# Patient Record
Sex: Female | Born: 1955 | Race: White | Hispanic: No | Marital: Married | Smoking: Former smoker
Health system: Southern US, Community
[De-identification: ages and names within clinical notes are randomized; demographics above are authoritative.]

## PROBLEM LIST (undated history)

## (undated) VITALS — BP 121/80 | HR 73 | Temp 97.6°F | Resp 16 | Ht 62.5 in | Wt 191.0 lb

## (undated) DIAGNOSIS — F1010 Alcohol abuse, uncomplicated: Secondary | ICD-10-CM

## (undated) DIAGNOSIS — I10 Essential (primary) hypertension: Secondary | ICD-10-CM

## (undated) DIAGNOSIS — E669 Obesity, unspecified: Secondary | ICD-10-CM

## (undated) DIAGNOSIS — T8859XA Other complications of anesthesia, initial encounter: Secondary | ICD-10-CM

## (undated) DIAGNOSIS — E785 Hyperlipidemia, unspecified: Secondary | ICD-10-CM

## (undated) DIAGNOSIS — Z8489 Family history of other specified conditions: Secondary | ICD-10-CM

## (undated) DIAGNOSIS — G039 Meningitis, unspecified: Secondary | ICD-10-CM

## (undated) DIAGNOSIS — F329 Major depressive disorder, single episode, unspecified: Secondary | ICD-10-CM

## (undated) DIAGNOSIS — F419 Anxiety disorder, unspecified: Secondary | ICD-10-CM

## (undated) DIAGNOSIS — K219 Gastro-esophageal reflux disease without esophagitis: Secondary | ICD-10-CM

## (undated) HISTORY — PX: COLON SURGERY: SHX602

## (undated) HISTORY — PX: CATARACT EXTRACTION: SUR2

## (undated) HISTORY — PX: APPENDECTOMY: SHX54

## (undated) HISTORY — PX: BREAST SURGERY: SHX581

---

## 1976-08-03 HISTORY — PX: APPENDECTOMY: SHX54

## 1992-08-03 HISTORY — PX: NASAL POLYP SURGERY: SHX186

## 1994-08-03 HISTORY — PX: BREAST SURGERY: SHX581

## 2004-02-08 ENCOUNTER — Encounter: Admission: RE | Admit: 2004-02-08 | Discharge: 2004-02-08 | Payer: Self-pay | Admitting: Family Medicine

## 2004-07-03 ENCOUNTER — Inpatient Hospital Stay (HOSPITAL_COMMUNITY): Admission: EM | Admit: 2004-07-03 | Discharge: 2004-07-04 | Payer: Self-pay | Admitting: Emergency Medicine

## 2004-07-04 ENCOUNTER — Ambulatory Visit: Payer: Self-pay | Admitting: *Deleted

## 2004-07-04 ENCOUNTER — Ambulatory Visit: Payer: Self-pay | Admitting: Cardiology

## 2004-08-26 ENCOUNTER — Other Ambulatory Visit: Admission: RE | Admit: 2004-08-26 | Discharge: 2004-08-26 | Payer: Self-pay | Admitting: Family Medicine

## 2004-10-29 ENCOUNTER — Ambulatory Visit (HOSPITAL_COMMUNITY): Admission: RE | Admit: 2004-10-29 | Discharge: 2004-10-29 | Payer: Self-pay | Admitting: Obstetrics

## 2005-07-29 ENCOUNTER — Ambulatory Visit (HOSPITAL_COMMUNITY): Admission: RE | Admit: 2005-07-29 | Discharge: 2005-07-29 | Payer: Self-pay | Admitting: Family Medicine

## 2006-05-05 ENCOUNTER — Ambulatory Visit (HOSPITAL_COMMUNITY): Admission: RE | Admit: 2006-05-05 | Discharge: 2006-05-05 | Payer: Self-pay | Admitting: Family Medicine

## 2006-11-22 ENCOUNTER — Emergency Department (HOSPITAL_COMMUNITY): Admission: EM | Admit: 2006-11-22 | Discharge: 2006-11-22 | Payer: Self-pay | Admitting: Emergency Medicine

## 2007-04-13 ENCOUNTER — Inpatient Hospital Stay (HOSPITAL_COMMUNITY): Admission: EM | Admit: 2007-04-13 | Discharge: 2007-04-15 | Payer: Self-pay | Admitting: Emergency Medicine

## 2008-09-27 ENCOUNTER — Ambulatory Visit (HOSPITAL_COMMUNITY): Admission: RE | Admit: 2008-09-27 | Discharge: 2008-09-27 | Payer: Self-pay | Admitting: Family Medicine

## 2008-10-18 ENCOUNTER — Emergency Department (HOSPITAL_COMMUNITY): Admission: EM | Admit: 2008-10-18 | Discharge: 2008-10-19 | Payer: Self-pay | Admitting: Emergency Medicine

## 2008-10-19 ENCOUNTER — Ambulatory Visit: Payer: Self-pay | Admitting: *Deleted

## 2008-10-19 ENCOUNTER — Inpatient Hospital Stay (HOSPITAL_COMMUNITY): Admission: AD | Admit: 2008-10-19 | Discharge: 2008-10-23 | Payer: Self-pay | Admitting: *Deleted

## 2008-10-24 ENCOUNTER — Other Ambulatory Visit (HOSPITAL_COMMUNITY): Admission: RE | Admit: 2008-10-24 | Discharge: 2008-12-12 | Payer: Self-pay | Admitting: Psychiatry

## 2008-10-25 ENCOUNTER — Ambulatory Visit: Payer: Self-pay | Admitting: Psychiatry

## 2009-10-23 ENCOUNTER — Emergency Department (HOSPITAL_COMMUNITY): Admission: EM | Admit: 2009-10-23 | Discharge: 2009-10-23 | Payer: Self-pay | Admitting: Family Medicine

## 2010-08-23 ENCOUNTER — Encounter: Payer: Self-pay | Admitting: Family Medicine

## 2010-08-24 ENCOUNTER — Encounter: Payer: Self-pay | Admitting: Family Medicine

## 2010-11-08 ENCOUNTER — Emergency Department (HOSPITAL_COMMUNITY)
Admission: EM | Admit: 2010-11-08 | Discharge: 2010-11-08 | Disposition: A | Discharge status: Home / Self Care | Payer: Managed Care, Other (non HMO) | Attending: Emergency Medicine | Admitting: Emergency Medicine

## 2010-11-08 DIAGNOSIS — W5901XA Bitten by nonvenomous lizards, initial encounter: Secondary | ICD-10-CM | POA: Insufficient documentation

## 2010-11-08 DIAGNOSIS — W5911XA Bitten by nonvenomous snake, initial encounter: Secondary | ICD-10-CM | POA: Insufficient documentation

## 2010-11-08 DIAGNOSIS — R112 Nausea with vomiting, unspecified: Secondary | ICD-10-CM | POA: Insufficient documentation

## 2010-11-08 DIAGNOSIS — M79609 Pain in unspecified limb: Secondary | ICD-10-CM | POA: Insufficient documentation

## 2010-11-08 DIAGNOSIS — I10 Essential (primary) hypertension: Secondary | ICD-10-CM | POA: Insufficient documentation

## 2010-11-08 DIAGNOSIS — S81009A Unspecified open wound, unspecified knee, initial encounter: Secondary | ICD-10-CM | POA: Insufficient documentation

## 2010-11-08 DIAGNOSIS — K219 Gastro-esophageal reflux disease without esophagitis: Secondary | ICD-10-CM | POA: Insufficient documentation

## 2010-11-11 LAB — URINE DRUGS OF ABUSE SCREEN W ALC, ROUTINE (REF LAB)
Amphetamine Screen, Ur: NEGATIVE
Cocaine Metabolites: NEGATIVE
Creatinine,U: 46.8 mg/dL
Ethyl Alcohol: <10 mg/dL (ref <10)
Phencyclidine (PCP): NEGATIVE

## 2010-11-12 LAB — BENZODIAZEPINE, QUANTITATIVE, URINE
Alprazolam (GC/LC/MS), ur confirm: NEGATIVE
Flurazepam GC/MS Conf: NEGATIVE

## 2010-11-12 LAB — URINE DRUGS OF ABUSE SCREEN W ALC, ROUTINE (REF LAB)
Barbiturate Quant, Ur: NEGATIVE
Barbiturate Quant, Ur: NEGATIVE
Benzodiazepines.: NEGATIVE
Marijuana Metabolite: NEGATIVE
Methadone: NEGATIVE
Methadone: NEGATIVE
Phencyclidine (PCP): NEGATIVE
Phencyclidine (PCP): NEGATIVE
Propoxyphene: NEGATIVE
Propoxyphene: NEGATIVE

## 2010-11-13 LAB — URINALYSIS, ROUTINE W REFLEX MICROSCOPIC
Bilirubin Urine: NEGATIVE
Glucose, UA: NEGATIVE mg/dL
Hgb urine dipstick: NEGATIVE
Ketones, ur: NEGATIVE mg/dL
pH: 5 (ref 5.0–8.0)

## 2010-11-13 LAB — HEPATITIS PANEL, ACUTE: Hep A IgM: NEGATIVE

## 2010-11-13 LAB — RAPID URINE DRUG SCREEN, HOSP PERFORMED: Barbiturates: NOT DETECTED

## 2010-11-13 LAB — COMPREHENSIVE METABOLIC PANEL
ALT: 434 U/L — ABNORMAL HIGH (ref 0–35)
ALT: 219 U/L — ABNORMAL HIGH (ref 0–35)
AST: 399 U/L — ABNORMAL HIGH (ref 0–37)
AST: 136 U/L — ABNORMAL HIGH (ref 0–37)
Alkaline Phosphatase: 83 U/L (ref 39–117)
CO2: 24 mEq/L (ref 19–32)
CO2: 30 mEq/L (ref 19–32)
Calcium: 9.3 mg/dL (ref 8.4–10.5)
Chloride: 104 mEq/L (ref 96–112)
Creatinine, Ser: 0.66 mg/dL (ref 0.4–1.2)
GFR calc Af Amer: >60 mL/min (ref >60)
GFR calc Af Amer: >60 mL/min (ref >60)
GFR calc non Af Amer: >60 mL/min (ref >60)
Glucose, Bld: 123 mg/dL — ABNORMAL HIGH (ref 70–99)
Potassium: 3.4 mEq/L — ABNORMAL LOW (ref 3.5–5.1)
Sodium: 141 mEq/L (ref 135–145)
Total Bilirubin: 0.6 mg/dL (ref 0.3–1.2)
Total Protein: 6.3 g/dL (ref 6.0–8.3)

## 2010-11-13 LAB — URINE DRUGS OF ABUSE SCREEN W ALC, ROUTINE (REF LAB)
Amphetamine Screen, Ur: NEGATIVE
Barbiturate Quant, Ur: NEGATIVE
Creatinine,U: 154.4 mg/dL
Marijuana Metabolite: NEGATIVE
Methadone: NEGATIVE
Propoxyphene: NEGATIVE

## 2010-11-13 LAB — CBC
MCV: 91 fL (ref 78.0–100.0)
RBC: 4.76 MIL/uL (ref 3.87–5.11)
WBC: 8.1 10*3/uL (ref 4.0–10.5)

## 2010-11-13 LAB — HEPATIC FUNCTION PANEL
ALT: 338 U/L — ABNORMAL HIGH (ref 0–35)
ALT: 220 U/L — ABNORMAL HIGH (ref 0–35)
AST: 133 U/L — ABNORMAL HIGH (ref 0–37)
Albumin: 3.8 g/dL (ref 3.5–5.2)
Alkaline Phosphatase: 93 U/L (ref 39–117)
Bilirubin, Direct: 0.1 mg/dL (ref 0.0–0.3)
Indirect Bilirubin: 0.5 mg/dL (ref 0.3–0.9)
Total Bilirubin: 0.6 mg/dL (ref 0.3–1.2)
Total Protein: 6.8 g/dL (ref 6.0–8.3)

## 2010-11-13 LAB — DIFFERENTIAL
Basophils Absolute: 0.2 10*3/uL — ABNORMAL HIGH (ref 0.0–0.1)
Basophils Relative: 3 % — ABNORMAL HIGH (ref 0–1)
Eosinophils Absolute: 0.4 10*3/uL (ref 0.0–0.7)
Eosinophils Relative: 5 % (ref 0–5)
Lymphocytes Relative: 41 % (ref 12–46)

## 2010-11-13 LAB — TSH: TSH: 1.142 u[IU]/mL (ref 0.350–4.500)

## 2010-11-13 LAB — BENZODIAZEPINE, QUANTITATIVE, URINE: Nordiazepam GC/MS Conf: 100 ng/mL

## 2010-12-16 NOTE — H&P (Signed)
Julia [name], ADDUCI NO.:  [id number]   MEDICAL RECORD NO.:  [id number]          PATIENT TYPE:  IPS   LOCATION:  0301                          FACILITY:  [facility]   PHYSICIAN:  Jasmine Pang, M.D. DATE OF BIRTH:  [birth year]   DATE OF ADMISSION:  10/19/2008  DATE OF DISCHARGE:                       PSYCHIATRIC ADMISSION ASSESSMENT   This is an involuntary admission to the services of Dr. Milford Cage.   She is a 55 year old, married, white female.  The commitment papers  indicate that she attempted suicide by injected an unknown substance,  she thought it was potassium, into her vein.  The patient stated that  she wanted to die and if discharged she will go home and get it right  this time.  She was actually quite intoxicated with an alcohol level of  244 at the time these papers were initiated.  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  She went to a local AA meeting.  They talked about how  alcoholics destroy their families and she went home and proceeded to get  drunk.  She miscarried her last child.  He would have been about 12.  She miscarried this female fetus at 6 months and a week later her brother  died in a car accident and she states she has not been right since.   PAST PSYCHIATRIC HISTORY:  She has no formal psychiatric care.  She is a  binger.  She has periods where she would not drink but once she starts  to drink she drinks until she is drunk.   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  She has been  married the one time.  She has 3 living children, a daughter 30, a  daughter 56, a son 7, and she would have had a 65 year old son had she  not miscarried.   FAMILY HISTORY:  Her entire family are alcoholics.  Her brother died at  age 43 from it.  Her sister currently is an alcoholic and both parents  are.   ALCOHOL AND DRUG HISTORY:  She reports that she began using  alcohol at  age 2.  Recently, she has been drinking up to a fifth a day.   PRIMARY CARE PHYSICIAN:  Dr. Gerda Diss.   MEDICAL PROBLEMS:  1. Hypertension.  2. She also is obese.   MEDICATIONS:  She is currently prescribed:  1. Celexa 20 mg p.o. daily.  2. Protonix 40 mg p.o. daily.  3. Lisinopril/hydrochlorothiazide 20/25 p.o. daily.  4. Xanax 0.5 mg p.o. t.i.d.  5. Flexeril 10 mg p.o. t.i.d.   POSITIVE PHYSICAL FINDINGS:  As already stated in the ED, her alcohol  level was 244.  Her UDS was otherwise negative.  Her CBC had no  abnormalities.  Her electrolytes had nothing worrisome on the findings  other than her SGOT which was elevated obviously from her alcohol abuse.  Today, she denies any issues with withdrawal.  She is alert and oriented  x3.  Vital  signs on admission show she is 63-1/4 inches tall.  She weighs  195.  Her temperature is 98.6.  Blood pressure is 149/92.  Pulse 67.  Respirations are 18.  She is status post an appendectomy, a lumpectomy,  polyps in her sinus, and 4 natural childbirths.   MENTAL STATUS EXAM:  Today, she is alert and oriented.  She is  appropriately groomed, dressed, and nourished.  Her speech is not  pressured.  Her mood is appropriate to the situation.  Her thought  processes are clear, rational, and goal oriented.  She clearly states  that hospitalization probably was not really needed.  Judgment and  insight appear to be intact.  Concentration and memory are intact.  Intelligence is at least average.  She denies being suicidal or  homicidal, states it was just the alcohol.  She denies having auditory  or visual hallucinations.   DIAGNOSES:  AXIS I:  1. 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  2. Alcohol abuse versus dependence.  AXIS II:  Deferred.  AXIS III:  1. Hypertension.  2. Obesity.  AXIS IV:  Unresolved grief issues.  AXIS V:  20.   PLAN:  Admit for safety  and stabilization to help her withdraw from  alcohol by use of a low-dose Librium protocol.  She was asked would she  like to start Campral.  She said she would.  She has been prescribed  Celexa 20 mg for about 2 years.  She stated that when it was increased  to 30 it made her hyper.  She states she has a lot of sensitivities to  medications but she is interested in stopping her abusive alcohol.  She  lives in [place].  She can be referred to the outpatient program up  there.   ESTIMATED LENGTH OF STAY:  Three days.      Mickie Leonarda Salon, P.A.-C.      Jasmine Pang, M.D.  Electronically Signed    MD/MEDQ  D:  10/20/2008  T:  10/20/2008  Job:  [id number]

## 2010-12-16 NOTE — H&P (Signed)
Julia [name], Julia [name]                  ACCOUNT NO.:  [id number]   MEDICAL RECORD NO.:  [id number]          PATIENT TYPE:  INP   LOCATION:  A341                          FACILITY:  [facility]   PHYSICIAN:  Scott A. Gerda Diss, MD    DATE OF BIRTH:  16-Dec-1955   DATE OF ADMISSION:  04/13/2007  DATE OF DISCHARGE:  LH                              HISTORY & PHYSICAL   CHIEF COMPLAINT:  Headache.   HISTORY OF PRESENT ILLNESS:  This is a nice 55 year old female who  states that she has had body aches and discomfort over the past 3 day,  abdominal pain, vomiting multiple times and diarrhea; also a little bit  of sore throat, headache, ear pain and relates that it has been less  urination than normal.  She has run fever as high as 101 earlier this  morning.  Starting last evening, she started having significant neck  pain and headache and increasing neck stiffness and headache throughout  the night and into today and got to the point where she felt like she  needed to do something, and so she came in to be seen.  She has tried  Phenergan.  She has tried Tylenol and ibuprofen, and it has not helped  any.  She relates that family have had some similar symptoms of  vomiting, diarrhea and nausea over the past few days but no one has had  a bad headache or neck stiffness.   PAST MEDICAL HISTORY:  Hypertension.  No other recent sicknesses.  The  patient does not know of any definite tick bite, but she states she has  had some ticks on her.  She has no rash with any of this.   There is a family history of breast cancer, hypertension, diabetes,  cholesterol.   SOCIAL HISTORY:  She smokes a few cigarettes per day.   ALLERGIES:  CEFTIN CAUSES A REACTION OF HIVES.  ALSO VICODIN MADE HER  SKIN FEEL LIKE IT CRAWLS AND CODEINE MAKES HER SICK ON THE STOMACH.   MEDICATIONS:  Include Zestoretic 20/25.   REVIEW OF SYSTEMS:  Positive for headache, photophobia, neck stiffness,  discomfort, nausea, abdominal pain,  severe headache.   PHYSICAL EXAMINATION:  GENERAL:  Looks to feel ill.  BP 128/70,  temperature 97, O2 saturation 96.  WBCs 6.7, hemoglobin 14.3.  Potassium  3.2, sodium 139, glucose 108.  Spinal fluid negative for any cells;  opening pressure was 40.  Glucose, protein 62 and 26, accordingly.  Urine culture pending.  Blood culture pending.  Spinal culture pending.  CT scan negative for any intracranial problems.   ASSESSMENT AND PLAN:  Viral illness with probable aseptic meningitis.  Needs coverage of antibiotics but also pain medication as well.  Monitor  the patient closely.  Give some IV fluids.  Supplement the potassium.  Restart her blood pressure medicine in the morning.  Keep her at bed  rest tonight.  Come tomorrow, she can get up and get  around some, and keep her in the hospital at least for 48 hours to  monitor how things  are going.  The patient was spoken with.  Infectious  precautions taken.  Dr. Gerilyn Pilgrim consulted for the morning to make sure  there are not other things that need to be done for this patient.      Scott A. Gerda Diss, MD  Electronically Signed     SAL/MEDQ  D:  04/13/2007  T:  04/14/2007  Job:  [id number]
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
## 2010-12-16 NOTE — Consult Note (Signed)
NAMEMARVETTE, Julia [name]                  ACCOUNT NO.:  [id number]   MEDICAL RECORD NO.:  [id number]          PATIENT TYPE:  INP   LOCATION:  A341                          FACILITY:  [facility]   PHYSICIAN:  Kofi A. Gerilyn Pilgrim, M.D. DATE OF BIRTH:  [birth year]   DATE OF CONSULTATION:  DATE OF DISCHARGE:                                 CONSULTATION   REASON FOR CONSULTATION:  Headache, possible meningitis.   This is a 55 year old white female who currently believes that she may  have had a viral process.  Members of her family have had vomiting,  diarrhea and abdominal discomfort for the past 3 days.  The patient also  has had similar symptoms, but hers seemed to have lasted longer and  associated with body aches, headache and neck stiffness.  The patient  apparently has had a low-grade fever of less than 100, although Dr.  Fletcher Anon notes indicate that she have 101 fever on the day of admission  at home.  The patient was told to come to the hospital and to get  evaluated.  She has been taking Tylenol and ibuprofen, which apparently  has not been helpful.  The patient reports that headaches are currently  7/10 but previously were much higher.  It seems to be she has gotten  better since being hospitalized.  She does not indicate being a headache  person at baseline.   PAST MEDICAL HISTORY:  Hypertension.   FAMILY HISTORY:  Significant for breast cancer, hypertension, diabetes,  and hypercholesterolemia.   SOCIAL HISTORY:  She smokes two cigarettes per day.   ALLERGIES:  CEFTIN, VICODIN, CODEINE.   HOME MEDICATIONS:  Zestoretic.   REVIEW OF SYSTEMS:  As stated in history of present illness, always  unrevealing.  She additionally does report having any rashes.  No clear  tick bite.  She apparently did find a tick on the back of her neck  recently.   PHYSICAL EXAMINATION:  GENERAL:  Shows an moderately overweight lady in  no acute distress.  VITAL SIGNS:  Temperature 96.4, pulse 78,  respiratory rate 20, blood  pressure 130/88.  HEENT:  Evaluation:  She does appear to have some stiff neck.  Head is  normocephalic, atraumatic.  ABDOMEN:  Obese but soft.  EXTREMITIES:  No significant edema.  SKIN:  No rashes or lesions were observed.  MENTATION:  She is awake and alert.  She converses well.  Speech,  language and cognition are intact.  CRANIAL NERVE:  Pupils are 4-mm and reactive to light and accommodation.  Extraocular movements are intact.  The visual fields are full.  Face and  muscle strength is symmetric.  Tongue is midline.  Uvula is midline.  Shoulder shrugs are normal.  Motor examination shows normal tone, bulk  and strength.  There is no pronator drift.  Coordination is intact.  Reflexes are +2 and symmetric with plantar reflexes downgoing.  Sensation normal to temperature and light touch.   Head CT scan of the brain is negative.  Spinal fluid was excess.  Gram  stains shows no organisms.  Cultures are  pending.  Blood cultures are  also pending.  Urinalysis negative.  WBCs in the CSF zero, RBCs zero,  protein 26, glucose 62.  Serum glucose 108, sodium 139, potassium 3.2,  chloride 106, CO2 28, glucose as mentioned above 108, BUN 5, creatinine  0.7, calcium 8.8.   ASSESSMENT:  Likely a viral syndrome with parameningeal process causing  parameningeal irritation, masquerading as meningitis.   RECOMMENDATIONS:  She will continue antibiotics, until we have at least  24 hours of culture available.  The cultures are fine.  We can  discontinue the antibiotics.  I am going to go ahead and discontinue the  isolation.   Thanks for allowing me to participate in the care of your patient.      Kofi A. Gerilyn Pilgrim, M.D.  Electronically Signed     KAD/MEDQ  D:  04/14/2007  T:  04/14/2007  Job:  [id number]

## 2010-12-19 NOTE — Discharge Summary (Signed)
[name], Julia [name]                  ACCOUNT NO.:  [id number]   MEDICAL RECORD NO.:  [id number]          PATIENT TYPE:  INP   LOCATION:  A341                          FACILITY:  [facility]   PHYSICIAN:  Scott A. Gerda Diss, MD    DATE OF BIRTH:  04-19-1956   DATE OF ADMISSION:  04/13/2007  DATE OF DISCHARGE:  09/12/2008LH                               DISCHARGE SUMMARY   DISCHARGE DIAGNOSES:  1. Meningitis.  2. Viral illness.  3. Hypertension.   HOSPITAL COURSE:  The patient admitted in with severe headache pain,  discomfort, fever.  She had a very high opening pressure when in the  emergency department with an LP tap.  Her scan was negative.  Dr.  Gerilyn Pilgrim saw her and felt because of her numbers more likely aseptic  meningitis versus just a viral process.  The patient was treated with  pain medications.  It should be noted that her white count was normal.  Sodium, potassium - potassium was a little bit low; it was corrected.  Her protein on the CSF was 26, glucose 62 and cells overall looked good  with no WBCs or RBCs.  The patient gradually improved and was able to be  sent home on pain medicine.  She had a CT scan of the head, and overall,  that looked good.  She was stable upon discharge.      Scott A. Gerda Diss, MD  Electronically Signed     SAL/MEDQ  D:  05/13/2007  T:  05/13/2007  Job:  [id number]

## 2010-12-19 NOTE — H&P (Signed)
Julia [name], Julia [name]                  ACCOUNT NO.:  [id number]   MEDICAL RECORD NO.:  [id number]          PATIENT TYPE:  INP   LOCATION:  A223                          FACILITY:  [facility]   PHYSICIAN:  Vania Rea, M.D. DATE OF BIRTH:  10/26/1955   DATE OF ADMISSION:  07/03/2004  DATE OF DISCHARGE:  LH                                HISTORY & PHYSICAL   PRIMARY CARE PHYSICIAN:  Renaye Rakers, M.D.   CHIEF COMPLAINT:  Chest pain since this morning.   HISTORY OF PRESENT ILLNESS:  This is a 55 year old Caucasian lady with a  history of hypertension, depression, hyperlipidemia controlled on diet who  has been having episodic chest pain for some weeks now which she has always  regarded as being stress related. The pains are usually relieved by resting  and relaxing.  However, at about 4 a.m. this morning the patient started  having severe 8/10 chest pain which she described as starting in her left  arm and radiating to her chest.  She says that the pain is sharp and aching  at the same time. It is relieved by reclining and aggravated by activity,  associated with diaphoresis, shortness of breath, and palpitations.  Eventually the patient asked her husband to drive her to the emergency room  where she received 3 sublingual nitroglycerin which brought the pain down to  a 0.  The patient has no history of prior cardiac workup.  She does smoke 1-  1/2 packs per day for about the past 20 years. She was recently diagnosed  with hyperlipidemia and managed on diet.  She does have hypertension.  She  does have a fairly significant family history of heart disease.  She denies  fever, cough, or cold.   Her baseline is that she is able to go up 2 flights of stairs and feel a  little short of breath, but she has no chest pains. She sometimes has  sweating and diaphoresis associated with perimenopausal symptoms.   PAST HISTORY:  As above, hypertension, depression, anxiety, and  hyperlipidemia.   MEDICATIONS:  1.  Benicar 40/25 daily.  2.  Prozac 20 mg daily.   ALLERGIES:  CODEINE causes nausea and VICODIN causes hallucinations.   SOCIAL HISTORY:  She is married.  Smokes 1-1/2 packs per day since age 46.  No alcohol or illicit drug use. She works as an Development worker, international aid.   FAMILY HISTORY:  Significant for her father who died at age 69 from  cirrhosis of the liver.  He also had hypertension and heart disease and a  mother who died at age 17 from cancer of the breast.  She has 3 siblings  with hypertension.   REVIEW OF SYSTEMS:  She denies headache, but admits to sinusitis.  No  thyroid problems. RESPIRATORY AND CARDIOVASCULAR:  As above.  She denies  nausea, vomiting, diarrhea, or constipation. She denies any genitourinary  problems or joint problems. She has never had any seizures, focal weakness,  or syncope.   PHYSICAL EXAMINATION:  GENERAL:  She is a very pleasant, middle-aged  Caucasian  lady reclining in bed.  VITAL SIGNS:  Temperature 97.4, pulse 51-68 on initial presentation.  Blood  pressure 114/56.  It was 110/71 on initial presentation.  Her pain was 8/10  on initial presentation. It is now gone.  She is saturating at 97% on 2  liters.  HEENT:  Pupils are round, equal, and reactive.  NECK:  She has no lymphadenopathy, no thyromegaly.  Mucous membranes are  pink, moist and anicteric.  Her chest is clear to auscultation bilaterally.  CARDIOVASCULAR:  Regular rhythm without murmur.  ABDOMEN:  She has no epigastric tenderness, but she does have exquisite  chest wall tenderness.  EXTREMITIES:  Lower extremities are without edema.  She has 2+ pulses  bilaterally.  CENTRAL NERVOUS SYSTEM:  Grossly intact.   LABS:  Her white count is 7.1, hematocrit 38.7.  RDW 13.8, platelets 250.  She has a normal differential on her white count.  Her sodium 135, potassium  3.1, chloride 104, CO2 26, glucose 93, BUN 10, creatinine 0.8, calcium 8.6,  total protein 6.5, albumin  3.5, AST 39, ALT 55, alkaline phosphatase 63,  total bilirubin 0.5.  D-dimmer is presently at 0.26, b-natriuretic peptide  is 30.  Her first set of point of care enzymes were negative.   ASSESSMENT:  Acute coronary syndrome/unstable angina.   PLAN:  1.  We will admit this lady on a rule out myocardial infarction protocol and      get a cardiology consult. She may get a stress test in the morning.  2.  Hypertension, controlled.  Continue Benicar.  3.  Hypokalemia.  We will discontinue HCTZ, replace her potassium and she      will be getting metoprolol as a part of the rule out MI protocol.  4.  History of hyperlipidemia.  She will be on a heart healthy diet with a      fasting lipid panel in the morning.     Leop   LC/MEDQ  D:  07/03/2004  T:  07/03/2004  Job:  [id number]

## 2010-12-19 NOTE — Consult Note (Signed)
NAMEMARSHE, [name]                  ACCOUNT NO.:  [id number]   MEDICAL RECORD NO.:  [id number]          PATIENT TYPE:  INP   LOCATION:  A223                          FACILITY:  [facility]   PHYSICIAN:  Vida Roller, M.D.   DATE OF BIRTH:  28-Feb-1956   DATE OF CONSULTATION:  DATE OF DISCHARGE:                                   CONSULTATION   PRIMARY CARE PHYSICIAN:  Dr. Renaye [name].   HISTORY OF PRESENT ILLNESS:  Julia [name] is 55 year old woman with a past  medical history significant for hypertension who presents to [facility]  complaining of chest discomfort.  She has had two weeks of intermittent left  anterior chest pressure.  She noted a brief episode this morning about 4  a.m. when she was awakened with the pressure radiating to her left shoulder  which was associated with some diaphoresis.  The pressure worsened with  activity and improved when she rested.  She took a sublingual nitroglycerin  in the ER with significant improvement in her discomfort.  She was admitted  for evaluation.   PAST MEDICAL HISTORY:  Significant for hypertension, dyslipidemia,  depression.  She has had no previous cardiac workup. She takes Benicar 40  and 25 once a day, and Prozac 20 mg once a day.  Here in the hospital, she  is on Avapro 300 mg once a day, Prozac 20 mg a day, Lopressor 25 mg b.i.d.,  one-half inch of nitro paste q.6h. and Tylenol as needed.  She lives in  [place] with her husband.  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  She is married.  She has three children.   SOCIAL HISTORY:  She smokes about 1-1/2 packs a day and has for the last 30  years.  She does yard work.  She is on a low-fat diet.   FAMILY HISTORY:  Her mother died at age 58 of breast cancer.  He father died  at age 77 of liver cirrhosis.  He did have some heart disease.  She has  three siblings, one of whom has hypertension.   REVIEW OF SYSTEMS:  Generally negative except for that reviewed in  the  history of present illness.  Specifically, she has chest pain and dyspnea on  exertion.  She occasionally will have palpitations.  Usually that is  associated with anxiety.  The remainder of her review of systems is  negative.   PHYSICAL EXAMINATION:  GENERAL:  She is a mildly obese white female, in no  apparent distress, alert and oriented x4 and a reasonably good historian.  VITAL SIGNS:  Pulse 59, respiratory rate 18, blood pressure 106/70.  She  weighs 169 pounds.  HEENT: Unremarkable.  NECK:  Supple.  There is no jugular venous distension or carotid bruits.  CHEST:  Clear to auscultation bilateral.  CARDIOVASCULAR:  Exam is regular with no obvious murmur.  ABDOMEN:  Soft, nontender.  GU/RECTAL:  Exams are deferred.  EXTREMITIES:  She has no clubbing, cyanosis or edema.  Pulses are 1+  throughout.  MUSCULOSKELETAL:  Exam is without deformity.  NEUROLOGIC:  Exam is grossly negative.   Her chest x-ray shows no acute disease.  Her electrocardiogram shows sinus  rhythm at a rate of 66 with normal intervals and normal axis.  She does have  inverted T waves across her anterior epicardium  from leads V1 through V3  which is most consistent a persistent juvenile pattern.  There are no  ischemic ST changes, and there are no Q waves concerning for a myocardial  infarction.   LABORATORY DATA:  White blood cell count 9, H&H 12 and 36 with a platelet  count of 335, sodium 137, potassium 3.6, chloride 103, bicarbonate 27, BUN  10, creatinine 0.8 and blood sugars are 87.  Liver function studies are  within normal limits.  Two sets of cardiac enzymes are not consistent with  acute myocardial infarction.  Her D-Dimer is 0.26.  Brain natriuretic  peptide is less than 30.   ASSESSMENT:  This is a woman with discomfort in her chest with cardiac  enzymes which are not consistent with acute myocardial infarction, but she  does have abnormal electrocardiogram.  It is unclear to me how long  this  electrocardiogram has been this way.  It is the only EKG she has ever had.  She is not having active discomfort as we speak.  Her hypertension is well  controlled on her current medication.  She does smoke and this is  concerning.  I recommended to her that she stop.  She takes the Prozac for  depression.   PLAN:  My recommendation is that we continue her medical therapy as  previously, and it probably would be very reasonable in this lady's case to  add aspirin to her medical therapy.  She needs an exercise Cardiolite which  we will get scheduled for her today if possible.     Trey Paula   JH/MEDQ  D:  07/04/2004  T:  07/04/2004  Job:  [id number]

## 2010-12-19 NOTE — Discharge Summary (Signed)
Julia [name], Julia [name] NO.:  [id number]   MEDICAL RECORD NO.:  [id number]          PATIENT TYPE:  INP   LOCATION:  A223                          FACILITY:  [facility]   PHYSICIAN:  Vania Rea, M.D. DATE OF BIRTH:  Feb 10, 1956   DATE OF ADMISSION:  07/03/2004  DATE OF DISCHARGE:  12/02/2005LH                                 DISCHARGE SUMMARY   PRIMARY CARE PHYSICIAN:  Dr. Jackquline Denmark.   CONSULTATIONS THIS ADMISSION:  Vida Roller, M.D.   DISCHARGE DIAGNOSIS:  1.  Chest pain, myocardial infarction ruled out.  2.  Hypertension, controlled.  3.  Anxiety disorder.  4.  Depression.  5.  Tobacco abuse.  6.  Hyperlipidemia   DISPOSITION:  Discharged to home.   DISCHARGE CONDITION:  Stable.   DISCHARGE MEDICATIONS:  1.  K-Dur 20 milliequivalents daily.  2.  The patient is to resume her outpatient medications.   HOSPITAL COURSE:  Please refer to admission history and physical.  This is a  55 year old lady with a history of hypertension and longstanding tobacco  abuse who presented to the emergency room with significant chest pressure.  Her cardiac enzymes were negative, and EKG was unremarkable but because of  her history she was admitted to rule out acute myocardial infarction.  Over  the course of her admission, she had three sets of cardiac enzymes which  were negative for any evidence of ischemia.  She was seen by the cardiology  service, evaluated, and had Cardiolite stress test which was negative for  any evidence of ischemia.  The patient was discharged home in stable  condition to be followed up by her primary care physician.  On the day of  discharge, vitals are:  Temperature 97.9, pulse 59, respirations 18, blood  pressure is 106/70.  Her chest is clear to auscultation bilaterally.  Cardiovascular system:  Regular rhythm.  Her abdomen is soft and nontender.  Her extremities are without edema.   LABORATORY DATA:  Her white count is 9.0, hematocrit  35.8, platelets 335.  Her sodium is 137, potassium 3.6, chloride 103, CO2 27, glucose 87, BUN 10,  creatinine 0.8.  Her calcium was 8.9.  Her liver function tests were  unremarkable.  Her beta natreatic peptide was less than 30.  Her TSH was  0.79.  Her lipid panel revealed cholesterol of 186, triglycerides of 85,  cholesterol with HDL of 53 and LDL of 116, VLDL 17, ratio of 3.5.   FOLLOW UP:  Follow up with primary care physician Dr. Renaye Rakers.     Leop   LC/MEDQ  D:  07/13/2004  T:  07/13/2004  Job:  [id number]

## 2011-03-06 ENCOUNTER — Emergency Department (HOSPITAL_COMMUNITY): Payer: Managed Care, Other (non HMO)

## 2011-03-06 ENCOUNTER — Inpatient Hospital Stay (HOSPITAL_COMMUNITY)
Admission: EM | Admit: 2011-03-06 | Discharge: 2011-03-07 | DRG: 605 | Disposition: A | Discharge status: Other Acute Inpatient Hospital | Payer: Managed Care, Other (non HMO) | Attending: Internal Medicine | Admitting: Internal Medicine

## 2011-03-06 ENCOUNTER — Encounter: Payer: Self-pay | Admitting: *Deleted

## 2011-03-06 ENCOUNTER — Other Ambulatory Visit: Payer: Self-pay

## 2011-03-06 DIAGNOSIS — F329 Major depressive disorder, single episode, unspecified: Secondary | ICD-10-CM | POA: Diagnosis present

## 2011-03-06 DIAGNOSIS — R945 Abnormal results of liver function studies: Secondary | ICD-10-CM | POA: Diagnosis present

## 2011-03-06 DIAGNOSIS — F32A Depression, unspecified: Secondary | ICD-10-CM | POA: Diagnosis present

## 2011-03-06 DIAGNOSIS — S61519A Laceration without foreign body of unspecified wrist, initial encounter: Secondary | ICD-10-CM

## 2011-03-06 DIAGNOSIS — I498 Other specified cardiac arrhythmias: Secondary | ICD-10-CM | POA: Diagnosis present

## 2011-03-06 DIAGNOSIS — R4182 Altered mental status, unspecified: Secondary | ICD-10-CM

## 2011-03-06 DIAGNOSIS — K700 Alcoholic fatty liver: Secondary | ICD-10-CM | POA: Diagnosis present

## 2011-03-06 DIAGNOSIS — R51 Headache: Secondary | ICD-10-CM | POA: Diagnosis present

## 2011-03-06 DIAGNOSIS — S61512A Laceration without foreign body of left wrist, initial encounter: Secondary | ICD-10-CM | POA: Diagnosis present

## 2011-03-06 DIAGNOSIS — F10929 Alcohol use, unspecified with intoxication, unspecified: Secondary | ICD-10-CM

## 2011-03-06 DIAGNOSIS — R001 Bradycardia, unspecified: Secondary | ICD-10-CM | POA: Diagnosis present

## 2011-03-06 DIAGNOSIS — F1020 Alcohol dependence, uncomplicated: Secondary | ICD-10-CM | POA: Diagnosis present

## 2011-03-06 DIAGNOSIS — F332 Major depressive disorder, recurrent severe without psychotic features: Secondary | ICD-10-CM | POA: Diagnosis present

## 2011-03-06 DIAGNOSIS — R519 Headache, unspecified: Secondary | ICD-10-CM | POA: Diagnosis present

## 2011-03-06 DIAGNOSIS — X789XXA Intentional self-harm by unspecified sharp object, initial encounter: Secondary | ICD-10-CM | POA: Diagnosis present

## 2011-03-06 DIAGNOSIS — R4589 Other symptoms and signs involving emotional state: Secondary | ICD-10-CM

## 2011-03-06 DIAGNOSIS — S61509A Unspecified open wound of unspecified wrist, initial encounter: Principal | ICD-10-CM | POA: Diagnosis present

## 2011-03-06 DIAGNOSIS — R7989 Other specified abnormal findings of blood chemistry: Secondary | ICD-10-CM | POA: Diagnosis present

## 2011-03-06 DIAGNOSIS — K219 Gastro-esophageal reflux disease without esophagitis: Secondary | ICD-10-CM | POA: Insufficient documentation

## 2011-03-06 DIAGNOSIS — I10 Essential (primary) hypertension: Secondary | ICD-10-CM | POA: Diagnosis present

## 2011-03-06 DIAGNOSIS — F17200 Nicotine dependence, unspecified, uncomplicated: Secondary | ICD-10-CM | POA: Diagnosis present

## 2011-03-06 DIAGNOSIS — Z720 Tobacco use: Secondary | ICD-10-CM | POA: Diagnosis present

## 2011-03-06 DIAGNOSIS — E876 Hypokalemia: Secondary | ICD-10-CM | POA: Diagnosis present

## 2011-03-06 HISTORY — DX: Essential (primary) hypertension: I10

## 2011-03-06 HISTORY — DX: Anxiety disorder, unspecified: F41.9

## 2011-03-06 HISTORY — DX: Alcohol abuse, uncomplicated: F10.10

## 2011-03-06 HISTORY — DX: Meningitis, unspecified: G03.9

## 2011-03-06 HISTORY — DX: Gastro-esophageal reflux disease without esophagitis: K21.9

## 2011-03-06 HISTORY — DX: Obesity, unspecified: E66.9

## 2011-03-06 HISTORY — DX: Major depressive disorder, single episode, unspecified: F32.9

## 2011-03-06 LAB — ETHANOL: Alcohol, Ethyl (B): 132 mg/dL — ABNORMAL HIGH (ref 0–11)

## 2011-03-06 LAB — URINALYSIS, ROUTINE W REFLEX MICROSCOPIC
Bilirubin Urine: NEGATIVE
Ketones, ur: NEGATIVE mg/dL
Leukocytes, UA: NEGATIVE
Nitrite: NEGATIVE
Specific Gravity, Urine: 1.015 (ref 1.005–1.030)
Urobilinogen, UA: 1 mg/dL (ref 0.0–1.0)

## 2011-03-06 LAB — DIFFERENTIAL
Basophils Relative: 2 % — ABNORMAL HIGH (ref 0–1)
Eosinophils Absolute: 0.5 10*3/uL (ref 0.0–0.7)
Lymphs Abs: 4.6 10*3/uL — ABNORMAL HIGH (ref 0.7–4.0)
Monocytes Absolute: 0.5 10*3/uL (ref 0.1–1.0)
Monocytes Relative: 5 % (ref 3–12)
Neutrophils Relative %: 40 % — ABNORMAL LOW (ref 43–77)

## 2011-03-06 LAB — COMPREHENSIVE METABOLIC PANEL
Alkaline Phosphatase: 89 U/L (ref 39–117)
BUN: 7 mg/dL (ref 6–23)
Creatinine, Ser: 0.8 mg/dL (ref 0.50–1.10)
GFR calc Af Amer: >60 mL/min (ref >60)
Glucose, Bld: 145 mg/dL — ABNORMAL HIGH (ref 70–99)
Potassium: 2.6 mEq/L — CL (ref 3.5–5.1)
Total Bilirubin: 0.2 mg/dL — ABNORMAL LOW (ref 0.3–1.2)
Total Protein: 7.8 g/dL (ref 6.0–8.3)

## 2011-03-06 LAB — CBC
HCT: 43.5 % (ref 36.0–46.0)
Hemoglobin: 14.7 g/dL (ref 12.0–15.0)
MCH: 30.2 pg (ref 26.0–34.0)
MCHC: 33.8 g/dL (ref 30.0–36.0)
MCV: 89.3 fL (ref 78.0–100.0)
RBC: 4.87 MIL/uL (ref 3.87–5.11)

## 2011-03-06 LAB — RAPID URINE DRUG SCREEN, HOSP PERFORMED: Barbiturates: NOT DETECTED

## 2011-03-06 MED ORDER — IBUPROFEN 400 MG PO TABS
400.0000 mg | ORAL_TABLET | Freq: Once | ORAL | Status: AC
  Administered 2011-03-06: 400 mg via ORAL

## 2011-03-06 MED ORDER — POTASSIUM CHLORIDE 10 MEQ/100ML IV SOLN
10.0000 meq | Freq: Once | INTRAVENOUS | Status: AC
  Administered 2011-03-06: 10 meq via INTRAVENOUS

## 2011-03-06 MED ORDER — TETANUS-DIPHTH-ACELL PERTUSSIS 5-2.5-18.5 LF-MCG/0.5 IM SUSP
0.5000 mL | Freq: Once | INTRAMUSCULAR | Status: AC
  Administered 2011-03-06: 0.5 mL via INTRAMUSCULAR

## 2011-03-06 MED ORDER — PANTOPRAZOLE SODIUM 40 MG IV SOLR
40.0000 mg | Freq: Once | INTRAVENOUS | Status: AC
  Administered 2011-03-07: 40 mg via INTRAVENOUS
  Filled 2011-03-06: qty 40

## 2011-03-06 MED ORDER — THIAMINE HCL 100 MG/ML IJ SOLN
100.0000 mg | Freq: Once | INTRAMUSCULAR | Status: AC
  Administered 2011-03-07: 02:00:00 via INTRAVENOUS
  Filled 2011-03-06: qty 2

## 2011-03-06 MED ORDER — POTASSIUM CHLORIDE 20 MEQ/15ML (10%) PO LIQD
40.0000 meq | Freq: Once | ORAL | Status: AC
Start: 2011-03-06 — End: 2011-03-06
  Administered 2011-03-06: 40 meq via ORAL
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
## 2011-03-06 NOTE — H&P (Signed)
Julia [name] is an 55 y.o. female.  Her PCP is Dr. Lilyan Punt  Chief Complaint: Suicidal  HPI: This is a 55 year old, Caucasian female with past medical history of hypertension, acid reflux disease, depression, alcoholism, who was apparently found on the street with her left wrist cut open. She was having some bleeding from that site. She was brought into the ED, subsequently. Patient at this time cannot remember how she got to the street. She doesn't remember cutting herself. She tells me that she has had 3 or drinks of bourbon and diet Pepsi today. She said this is her first drink the last 3 months. She said she and her husband has have been having constant arguments and fights. She has tried to take her own life in the past and has required psychiatric treatment. She denies any chest pain or shortness of breath. She does have a headache, but denies any focal the weakness. Denies any tingling or numbness. Denies any nausea, vomiting. She started crying when I started asking her questions about her mental state. So, there is some amnesia, here, probably because of her alcoholism. So history is limited.   Prior to Admission medications   Medication Sig Start Date End Date Taking? Authorizing Provider  citalopram (CELEXA) 40 MG tablet Take 40 mg by mouth daily.     Yes Historical Provider, MD  lisinopril-hydrochlorothiazide (PRINZIDE,ZESTORETIC) 20-25 MG per tablet Take 1 tablet by mouth daily.     Yes Historical Provider, MD  pantoprazole (PROTONIX) 40 MG tablet Take 40 mg by mouth daily.     Yes Historical Provider, MD  ibuprofen (ADVIL,MOTRIN) 200 MG tablet Take 200 mg by mouth every 6 (six) hours as needed.      Historical Provider, MD    Allergies:  Allergies  Allergen Reactions  . Penicillins Rash    Past Medical History  Diagnosis Date  . ETOH abuse   . Headache   . Mental disorder   . Hypertension   . GERD (gastroesophageal reflux disease)     Past Surgical History    Procedure Date  . Appendectomy     Social History:  reports that she has been smoking.  She does not have any smokeless tobacco history on file. She reports that she drinks alcohol. She reports that she does not use illicit drugs.  Family History: History reviewed. No pertinent family history.  Review of Systems  Constitutional: Negative.   HENT: Negative for hearing loss.   Eyes: Negative for blurred vision and double vision.  Respiratory: Negative.   Cardiovascular: Negative for chest pain and palpitations.  Gastrointestinal: Negative.   Genitourinary: Negative.   Musculoskeletal: Positive for joint pain.       Bilateral hip pain for past few days.  Skin: Negative.   Neurological: Positive for headaches. Negative for focal weakness.  Endo/Heme/Allergies: Negative.   Psychiatric/Behavioral: Positive for depression and suicidal ideas.    Blood pressure 106/63, pulse 71, temperature 98.3 F (36.8 C), temperature source Oral, resp. rate 20, SpO2 95.00%. Physical Exam  Vitals reviewed. Constitutional: She appears well-developed and well-nourished. No distress.  HENT:  Head: Normocephalic and atraumatic.  Mouth/Throat: Oropharynx is clear and moist. No oropharyngeal exudate.  Eyes: Conjunctivae and EOM are normal. Pupils are equal, round, and reactive to light. Right eye exhibits no discharge. Left eye exhibits no discharge. No scleral icterus.  Neck: Normal range of motion. Neck supple. No JVD present. No tracheal deviation present. No thyromegaly present.  Cardiovascular: Normal rate, regular  rhythm and normal heart sounds.  Exam reveals no gallop and no friction rub.   No murmur heard. Pulmonary/Chest: Effort normal and breath sounds normal. No stridor. No respiratory distress. She has no wheezes. She has no rales. She exhibits no tenderness.  Abdominal: Soft. Bowel sounds are normal. She exhibits no distension and no mass. There is no hepatosplenomegaly. There is no tenderness.  There is no rebound and no guarding.  Musculoskeletal: Normal range of motion.  Lymphadenopathy:    She has no cervical adenopathy.  Neurological: She is alert. No cranial nerve deficit.  Skin: Skin is warm and dry. She is not diaphoretic.     Psychiatric: Her affect is labile. Her speech is delayed. She is withdrawn. She exhibits a depressed mood. She expresses suicidal ideation. She expresses suicidal plans.     Results for orders placed during the hospital encounter of 03/06/11 (from the past 48 hour(s))  CBC     Status: Normal   Collection Time   03/06/11  6:40 PM      Component Value Range Comment   WBC 9.6  4.0 - 10.5 (K/uL)    RBC 4.87  3.87 - 5.11 (MIL/uL)    Hemoglobin 14.7  12.0 - 15.0 (g/dL)    HCT 16.1  09.6 - 04.5 (%)    MCV 89.3  78.0 - 100.0 (fL)    MCH 30.2  26.0 - 34.0 (pg)    MCHC 33.8  30.0 - 36.0 (g/dL)    RDW 40.9  81.1 - 91.4 (%)    Platelets 332  150 - 400 (K/uL)   DIFFERENTIAL     Status: Abnormal   Collection Time   03/06/11  6:40 PM      Component Value Range Comment   Neutrophils Relative 40 (*) 43 - 77 (%)    Neutro Abs 3.9  1.7 - 7.7 (K/uL)    Lymphocytes Relative 48 (*) 12 - 46 (%)    Lymphs Abs 4.6 (*) 0.7 - 4.0 (K/uL)    Monocytes Relative 5  3 - 12 (%)    Monocytes Absolute 0.5  0.1 - 1.0 (K/uL)    Eosinophils Relative 5  0 - 5 (%)    Eosinophils Absolute 0.5  0.0 - 0.7 (K/uL)    Basophils Relative 2 (*) 0 - 1 (%)    Basophils Absolute 0.2 (*) 0.0 - 0.1 (K/uL)   COMPREHENSIVE METABOLIC PANEL     Status: Abnormal   Collection Time   03/06/11  6:40 PM      Component Value Range Comment   Sodium 141  135 - 145 (mEq/L)    Potassium 2.6 (*) 3.5 - 5.1 (mEq/L)    Chloride 100  96 - 112 (mEq/L)    CO2 20  19 - 32 (mEq/L)    Glucose, Bld 145 (*) 70 - 99 (mg/dL)    BUN 7  6 - 23 (mg/dL)    Creatinine, Ser 7.82  0.50 - 1.10 (mg/dL)    Calcium 9.8  8.4 - 10.5 (mg/dL)    Total Protein 7.8  6.0 - 8.3 (g/dL)    Albumin 3.6  3.5 - 5.2 (g/dL)    AST 956  (*) 0 - 37 (U/L)    ALT 155 (*) 0 - 35 (U/L)    Alkaline Phosphatase 89  39 - 117 (U/L)    Total Bilirubin 0.2 (*) 0.3 - 1.2 (mg/dL)    GFR calc non Af Amer >60  >60 (mL/min)  GFR calc Af Amer >60  >60 (mL/min)   ETHANOL     Status: Abnormal   Collection Time   03/06/11  6:40 PM      Component Value Range Comment   Alcohol, Ethyl (B) 132 (*) 0 - 11 (mg/dL)   URINALYSIS, ROUTINE W REFLEX MICROSCOPIC     Status: Normal   Collection Time   03/06/11  8:30 PM      Component Value Range Comment   Color, Urine YELLOW  YELLOW     Appearance CLEAR  CLEAR     Specific Gravity, Urine 1.015  1.005 - 1.030     pH 6.0  5.0 - 8.0     Glucose, UA NEGATIVE  NEGATIVE (mg/dL)    Hgb urine dipstick NEGATIVE  NEGATIVE     Bilirubin Urine NEGATIVE  NEGATIVE     Ketones, ur NEGATIVE  NEGATIVE (mg/dL)    Protein, ur NEGATIVE  NEGATIVE (mg/dL)    Urobilinogen, UA 1.0  0.0 - 1.0 (mg/dL)    Nitrite NEGATIVE  NEGATIVE     Leukocytes, UA NEGATIVE  NEGATIVE  MICROSCOPIC NOT DONE ON URINES WITH NEGATIVE PROTEIN, BLOOD, LEUKOCYTES, NITRITE, OR GLUCOSE <1000 mg/dL.  URINE RAPID DRUG SCREEN (HOSP PERFORMED)     Status: Normal   Collection Time   03/06/11  8:30 PM      Component Value Range Comment   Opiates NONE DETECTED  NONE DETECTED     Cocaine NONE DETECTED  NONE DETECTED     Benzodiazepines NONE DETECTED  NONE DETECTED     Amphetamines NONE DETECTED  NONE DETECTED     Tetrahydrocannabinol NONE DETECTED  NONE DETECTED     Barbiturates NONE DETECTED  NONE DETECTED     Dg Chest 1 View  03/06/2011  *RADIOLOGY REPORT*  Clinical Data: Trauma.  Pain.  EtOH.  Left-sided hip pain.  CHEST - 1 VIEW  Comparison: 07/03/2004  Findings: Heart size is normal.  The lungs are clear.  No evidence for acute fracture, pneumothorax.  No focal consolidations or pleural effusions.  IMPRESSION: Negative exam.  Original Report Authenticated By: Patterson Hammersmith, M.D.   Dg Hip Complete Left  03/06/2011  *RADIOLOGY REPORT*  Clinical  Data: Trauma.  Pain.  Left hip pain.  Altered level of consciousness.  EtOH.  LEFT HIP - COMPLETE 2+ VIEW  Comparison: None.  Findings: There is no evidence for acute fracture or dislocation. No soft tissue foreign body or gas identified. Regional bowel gas pattern is normal.  Pelvic phleboliths are present.  IMPRESSION: Negative exam.  Original Report Authenticated By: Patterson Hammersmith, M.D.   Ct Head Wo Contrast  03/06/2011  *RADIOLOGY REPORT*  Clinical Data:  Trauma.  Pain.  Altered mental status.  CT HEAD WITHOUT CONTRAST CT CERVICAL SPINE WITHOUT CONTRAST  Technique:  Multidetector CT imaging of the head and cervical spine was performed following the standard protocol without intravenous contrast.  Multiplanar CT image reconstructions of the cervical spine were also generated.  Comparison:  04/13/2007  CT HEAD  Findings: There is no intra or extra-axial fluid collection or mass lesion.  The basilar cisterns and ventricles have a normal appearance.  There is no CT evidence for acute infarction or hemorrhage.  Bone windows show mucoperiosteal thickening within the left maxillary sinus, likely chronic.  No calvarial fracture identified.  IMPRESSION: No evidence for acute  abnormality. 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  CT CERVICAL SPINE  Findings: There is normal alignment of the cervical  spine.  There is no evidence for acute fracture or dislocation.  Prevertebral soft tissues have a normal appearance.  Lung apices have a normal appearance.  IMPRESSION: Negative exam.  Original Report Authenticated By: Patterson Hammersmith, M.D.   Ct Cervical Spine Wo Contrast  03/06/2011  *RADIOLOGY REPORT*  Clinical Data:  Trauma.  Pain.  Altered mental status.  CT HEAD WITHOUT CONTRAST CT CERVICAL SPINE WITHOUT CONTRAST  Technique:  Multidetector CT imaging of the head and cervical spine was performed following the standard protocol without intravenous contrast.  Multiplanar CT image reconstructions of the  cervical spine were also generated.  Comparison:  04/13/2007  CT HEAD  Findings: There is no intra or extra-axial fluid collection or mass lesion.  The basilar cisterns and ventricles have a normal appearance.  There is no CT evidence for acute infarction or hemorrhage.  Bone windows show mucoperiosteal thickening within the left maxillary sinus, likely chronic.  No calvarial fracture identified.  IMPRESSION: No evidence for acute  abnormality. 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  CT CERVICAL SPINE  Findings: There is normal alignment of the cervical spine.  There is no evidence for acute fracture or dislocation.  Prevertebral soft tissues have a normal appearance.  Lung apices have a normal appearance.  IMPRESSION: Negative exam.  Original Report Authenticated By: Patterson Hammersmith, M.D.    EKG shows sinus rhythm at 78 beats per minute. Axis is normal. Intervals appear to be normal. Actually, the QTC is 469, mildly prolonged. No Q waves are noted. There is some concavity noted in her ST segments in the inferior leads and also in V5, V6. No older EKGs are available for comparison.   Assessment/Plan  Principal Problem:  *Suicidal behavior Active Problems:  Hypokalemia  Headache  HTN (hypertension)  Depression  Abnormal LFTs  Alcoholism   So, this is a 55 year old, Caucasian female, who brought into the hospital after she was found lying on the street with her left wrist cut open. This will be considered a suicidal attempt. She also had multiple drinks today as well. She also has severe hypokalemia. She has abnormal LFTs.  Plan:  #1 suicidal behavior: Patient will be admitted for medical stabilization and then will need to be seen by psychiatry. She'll probably require inpatient psychiatric treatment. Continue with Celexa for now.  #2 severe hypokalemia. Will be repleted intravenously and orally. Magnesium level will be checked.  #3 abnormal LFTs: Hepatitis panel be checked.  Ultrasound of hepatobiliary system will also be performed.  #4 headache: CT head did not show any acute abnormalities. Continue to monitor.  #5 alcoholism: Will give her thiamine, folate. CIWA scale will be utilized.  #6 history of hypertension: Continue to monitor. Hold off on her antihypertensive agents for now.  #7 DVT, prophylaxis will be initiated. Protonix will be given.  #8 abnormal EKG: Patient denies any chest pain. We will repeat EKG in the morning. Cardiac enzymes will be checked once.  A sitter will be utilized along with suicidal precautions. We may need to involuntarily commit this patient.  Further management decisions will depend on results of further testing and patient's response to treatment.  Coburn Knaus 03/06/2011, 10:34 PM

## 2011-03-06 NOTE — ED Notes (Signed)
Hospitalist at bedside to examine; a&ox3; answering questions appropriately; in no distress.

## 2011-03-06 NOTE — ED Notes (Addendum)
K+ of 2.6 called from lab; EDP notified of results.

## 2011-03-06 NOTE — ED Notes (Signed)
Medicated for pain as ordered; sitting up in bed, alert, talking with family; pt responding appropriately to family, as upon arrival she stated that she did not recognize them when they came into see her

## 2011-03-06 NOTE — ED Notes (Signed)
Ella from [facility] notified of pt and in department for exam.

## 2011-03-07 ENCOUNTER — Encounter (HOSPITAL_COMMUNITY): Payer: Self-pay | Admitting: *Deleted

## 2011-03-07 ENCOUNTER — Inpatient Hospital Stay (HOSPITAL_COMMUNITY): Payer: Managed Care, Other (non HMO)

## 2011-03-07 ENCOUNTER — Inpatient Hospital Stay (HOSPITAL_COMMUNITY)
Admission: AD | Admit: 2011-03-07 | Discharge: 2011-03-10 | DRG: 885 | Disposition: A | Discharge status: Home / Self Care | Payer: Managed Care, Other (non HMO) | Attending: Psychiatry | Admitting: Psychiatry

## 2011-03-07 DIAGNOSIS — X838XXA Intentional self-harm by other specified means, initial encounter: Secondary | ICD-10-CM

## 2011-03-07 DIAGNOSIS — IMO0002 Reserved for concepts with insufficient information to code with codable children: Secondary | ICD-10-CM

## 2011-03-07 DIAGNOSIS — Z720 Tobacco use: Secondary | ICD-10-CM | POA: Diagnosis present

## 2011-03-07 DIAGNOSIS — S61509A Unspecified open wound of unspecified wrist, initial encounter: Secondary | ICD-10-CM

## 2011-03-07 DIAGNOSIS — K219 Gastro-esophageal reflux disease without esophagitis: Secondary | ICD-10-CM

## 2011-03-07 DIAGNOSIS — Z818 Family history of other mental and behavioral disorders: Secondary | ICD-10-CM

## 2011-03-07 DIAGNOSIS — Z560 Unemployment, unspecified: Secondary | ICD-10-CM

## 2011-03-07 DIAGNOSIS — F332 Major depressive disorder, recurrent severe without psychotic features: Principal | ICD-10-CM

## 2011-03-07 DIAGNOSIS — E669 Obesity, unspecified: Secondary | ICD-10-CM

## 2011-03-07 DIAGNOSIS — Z9151 Personal history of suicidal behavior: Secondary | ICD-10-CM

## 2011-03-07 DIAGNOSIS — R001 Bradycardia, unspecified: Secondary | ICD-10-CM | POA: Diagnosis present

## 2011-03-07 DIAGNOSIS — F1010 Alcohol abuse, uncomplicated: Secondary | ICD-10-CM

## 2011-03-07 DIAGNOSIS — Z6379 Other stressful life events affecting family and household: Secondary | ICD-10-CM

## 2011-03-07 DIAGNOSIS — I10 Essential (primary) hypertension: Secondary | ICD-10-CM

## 2011-03-07 DIAGNOSIS — F4310 Post-traumatic stress disorder, unspecified: Secondary | ICD-10-CM

## 2011-03-07 DIAGNOSIS — S61512A Laceration without foreign body of left wrist, initial encounter: Secondary | ICD-10-CM | POA: Diagnosis present

## 2011-03-07 HISTORY — DX: Personal history of suicidal behavior: Z91.51

## 2011-03-07 LAB — CARDIAC PANEL(CRET KIN+CKTOT+MB+TROPI)
Relative Index: INVALID (ref 0.0–2.5)
Total CK: 80 U/L (ref 7–177)
Troponin I: <0.3 ng/mL (ref <0.30)

## 2011-03-07 LAB — COMPREHENSIVE METABOLIC PANEL
Albumin: 3.1 g/dL — ABNORMAL LOW (ref 3.5–5.2)
Alkaline Phosphatase: 72 U/L (ref 39–117)
BUN: 8 mg/dL (ref 6–23)
Chloride: 105 mEq/L (ref 96–112)
Glucose, Bld: 108 mg/dL — ABNORMAL HIGH (ref 70–99)
Potassium: 3.6 mEq/L (ref 3.5–5.1)
Total Bilirubin: 0.3 mg/dL (ref 0.3–1.2)

## 2011-03-07 LAB — CBC
MCHC: 33.9 g/dL (ref 30.0–36.0)
MCV: 89.9 fL (ref 78.0–100.0)
Platelets: 233 10*3/uL (ref 150–400)
RDW: 13.4 % (ref 11.5–15.5)
WBC: 8.8 10*3/uL (ref 4.0–10.5)

## 2011-03-07 LAB — T4, FREE: Free T4: 1.05 ng/dL (ref 0.80–1.80)

## 2011-03-07 MED ORDER — POTASSIUM CHLORIDE CRYS ER 20 MEQ PO TBCR: 20.0000 meq | EXTENDED_RELEASE_TABLET | Freq: Every day | ORAL | Status: DC

## 2011-03-07 MED ORDER — SODIUM CHLORIDE 0.9 % IV SOLN: INTRAVENOUS | Status: DC

## 2011-03-07 MED ORDER — POTASSIUM CHLORIDE CRYS ER 20 MEQ PO TBCR
40.0000 meq | EXTENDED_RELEASE_TABLET | Freq: Once | ORAL | Status: AC
  Administered 2011-03-07: 40 meq via ORAL
  Filled 2011-03-07: qty 2

## 2011-03-07 MED ORDER — PANTOPRAZOLE SODIUM 40 MG PO TBEC
40.0000 mg | DELAYED_RELEASE_TABLET | Freq: Every day | ORAL | Status: DC
  Administered 2011-03-07: 40 mg via ORAL
  Filled 2011-03-07: qty 1

## 2011-03-07 MED ORDER — ONDANSETRON HCL 4 MG PO TABS: 4.0000 mg | ORAL_TABLET | Freq: Four times a day (QID) | ORAL | Status: DC | PRN

## 2011-03-07 MED ORDER — FOLIC ACID 1 MG PO TABS
1.0000 mg | ORAL_TABLET | Freq: Every day | ORAL | Status: DC
  Administered 2011-03-07: 1 mg via ORAL
  Filled 2011-03-07: qty 1

## 2011-03-07 MED ORDER — POTASSIUM CHLORIDE IN NACL 20-0.9 MEQ/L-% IV SOLN
INTRAVENOUS | Status: DC
  Administered 2011-03-07: 09:00:00 via INTRAVENOUS

## 2011-03-07 MED ORDER — ALBUTEROL SULFATE (5 MG/ML) 0.5% IN NEBU: 2.5000 mg | INHALATION_SOLUTION | RESPIRATORY_TRACT | Status: DC | PRN

## 2011-03-07 MED ORDER — SENNA 8.6 MG PO TABS: 2.0000 | ORAL_TABLET | Freq: Every day | ORAL | Status: DC | PRN

## 2011-03-07 MED ORDER — CITALOPRAM HYDROBROMIDE 20 MG PO TABS
40.0000 mg | ORAL_TABLET | Freq: Every day | ORAL | Status: DC
  Administered 2011-03-07: 40 mg via ORAL
  Filled 2011-03-07: qty 2

## 2011-03-07 MED ORDER — IBUPROFEN 400 MG PO TABS
200.0000 mg | ORAL_TABLET | Freq: Four times a day (QID) | ORAL | Status: DC | PRN
  Administered 2011-03-07: 15:00:00 via ORAL
  Filled 2011-03-07: qty 1

## 2011-03-07 MED ORDER — THIAMINE HCL 100 MG PO TABS: 100.0000 mg | ORAL_TABLET | Freq: Every day | ORAL | Status: AC

## 2011-03-07 MED ORDER — SODIUM CHLORIDE 0.9 % IV SOLN
INTRAVENOUS | Status: DC
  Administered 2011-03-07: 02:00:00 via INTRAVENOUS

## 2011-03-07 MED ORDER — FOLIC ACID 1 MG PO TABS: 1.0000 mg | ORAL_TABLET | Freq: Every day | ORAL | Status: AC

## 2011-03-07 MED ORDER — POTASSIUM CHLORIDE 10 MEQ/100ML IV SOLN
10.0000 meq | Freq: Once | INTRAVENOUS | Status: AC
  Administered 2011-03-07: 10 meq via INTRAVENOUS
  Filled 2011-03-07: qty 100

## 2011-03-07 MED ORDER — LORAZEPAM 2 MG/ML IJ SOLN: 1.0000 mg | INTRAMUSCULAR | Status: DC | PRN

## 2011-03-07 MED ORDER — ONDANSETRON HCL 4 MG/2ML IJ SOLN: 4.0000 mg | Freq: Four times a day (QID) | INTRAMUSCULAR | Status: DC | PRN

## 2011-03-07 MED ORDER — POTASSIUM CHLORIDE CRYS ER 20 MEQ PO TBCR
20.0000 meq | EXTENDED_RELEASE_TABLET | Freq: Two times a day (BID) | ORAL | Status: DC
  Administered 2011-03-07: 20 meq via ORAL
  Filled 2011-03-07: qty 1

## 2011-03-07 MED ORDER — ALUM & MAG HYDROXIDE-SIMETH 200-200-20 MG/5ML PO SUSP: 30.0000 mL | Freq: Four times a day (QID) | ORAL | Status: DC | PRN

## 2011-03-07 MED ORDER — ONDANSETRON HCL 4 MG/2ML IJ SOLN: 4.0000 mg | Freq: Three times a day (TID) | INTRAMUSCULAR | Status: DC | PRN

## 2011-03-07 MED ORDER — VITAMIN B-1 100 MG PO TABS
100.0000 mg | ORAL_TABLET | Freq: Every day | ORAL | Status: DC
  Administered 2011-03-07: 100 mg via ORAL
  Filled 2011-03-07: qty 1

## 2011-03-07 NOTE — Progress Notes (Signed)
Pt discharged and left with [facility] Department Deputy for Involuntary Commitment to [facility]. Pt tearful,but cooperative.

## 2011-03-07 NOTE — Consult Note (Signed)
CSW and [facility] were both ordered to consult on this Pt.  CSW will defer to [facility] assessment, continue to follow this Pt, and assist with D/C planning as indicated.

## 2011-03-07 NOTE — Progress Notes (Signed)
[facility] team has arranged pt transfer to [facility]. Dr Sherrie Mustache has been notified and is preparing discharge orders. Pt is very tearful. Family at beside at this time. 1:1 sitter remains w/ pt.

## 2011-03-07 NOTE — Discharge Summary (Signed)
Physician Discharge Summary  Julia [name] MRN: [id number] DOB/AGE: 54-Mar-1957 54 y.o.  PCP: Lilyan Punt, MD.   Admit date: 03/06/2011 Discharge date: 03/07/2011  Discharge Diagnoses:  1. Suicide attempt with cutting of left wrist. 2. 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  3. History of suicide attempt in 2010 with intentional injection of potassium chloride. 4. Hypokalemia, repleted. 5. Alcohol abuse. 6. Tobacco abuse. 7. Hypertension. 8. Elevated LFTs, hepatic transaminitis, secondary to alcohol abuse and fatty liver. The results of the acute viral hepatitis panel were pending at the time of discharge. 9. Fatty liver per ultrasound of the abdomen. 10. Transient bradycardia. The results of TSH and free T4 were pending at the time of hospital discharge. 11. Chronic headaches. 12. Reported urethral bleeding, none objectively seen. Initial urinalysis was essentially negative.    Current Discharge Medication List    START taking these medications   Details  folic acid (FOLVITE) 1 MG tablet Take 1 tablet (1 mg total) by mouth daily.    potassium chloride SA (K-DUR,KLOR-CON) 20 MEQ tablet Take 1 tablet (20 mEq total) by mouth daily. Qty: 30 tablet, Refills: 0    vitamin B-1 100 MG tablet Take 1 tablet (100 mg total) by mouth daily.      CONTINUE these medications which have NOT CHANGED   Details  citalopram (CELEXA) 40 MG tablet Take 40 mg by mouth daily.      lisinopril-hydrochlorothiazide (PRINZIDE,ZESTORETIC) 20-25 MG per tablet Take 1 tablet by mouth daily.      pantoprazole (PROTONIX) 40 MG tablet Take 40 mg by mouth daily.      ibuprofen (ADVIL,MOTRIN) 200 MG tablet Take 200 mg by mouth every 6 (six) hours as needed.          Discharge Condition: Stable  Disposition: To the [facility].   Consults: Act team   Significant Diagnostic Studies: Dg Chest 1 View  03/06/2011  *RADIOLOGY REPORT*  Clinical Data: Trauma.  Pain.   EtOH.  Left-sided hip pain.  CHEST - 1 VIEW  Comparison: 07/03/2004  Findings: Heart size is normal.  The lungs are clear.  No evidence for acute fracture, pneumothorax.  No focal consolidations or pleural effusions.  IMPRESSION: Negative exam.  Original Report Authenticated By: Patterson Hammersmith, M.D.   Dg Hip Complete Left  03/06/2011  *RADIOLOGY REPORT*  Clinical Data: Trauma.  Pain.  Left hip pain.  Altered level of consciousness.  EtOH.  LEFT HIP - COMPLETE 2+ VIEW  Comparison: None.  Findings: There is no evidence for acute fracture or dislocation. No soft tissue foreign body or gas identified. Regional bowel gas pattern is normal.  Pelvic phleboliths are present.  IMPRESSION: Negative exam.  Original Report Authenticated By: Patterson Hammersmith, M.D.   Ct Head Wo Contrast  03/06/2011  *RADIOLOGY REPORT*  Clinical Data:  Trauma.  Pain.  Altered mental status.  CT HEAD WITHOUT CONTRAST CT CERVICAL SPINE WITHOUT CONTRAST  Technique:  Multidetector CT imaging of the head and cervical spine was performed following the standard protocol without intravenous contrast.  Multiplanar CT image reconstructions of the cervical spine were also generated.  Comparison:  04/13/2007  CT HEAD  Findings: There is no intra or extra-axial fluid collection or mass lesion.  The basilar cisterns and ventricles have a normal appearance.  There is no CT evidence for acute infarction or hemorrhage.  Bone windows show mucoperiosteal thickening within the left maxillary sinus, likely chronic.  No calvarial fracture identified.  IMPRESSION:  No evidence for acute  abnormality. 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  CT CERVICAL SPINE  Findings: There is normal alignment of the cervical spine.  There is no evidence for acute fracture or dislocation.  Prevertebral soft tissues have a normal appearance.  Lung apices have a normal appearance.  IMPRESSION: Negative exam.  Original Report Authenticated By: Patterson Hammersmith, M.D.    Ct Cervical Spine Wo Contrast  03/06/2011  *RADIOLOGY REPORT*  Clinical Data:  Trauma.  Pain.  Altered mental status.  CT HEAD WITHOUT CONTRAST CT CERVICAL SPINE WITHOUT CONTRAST  Technique:  Multidetector CT imaging of the head and cervical spine was performed following the standard protocol without intravenous contrast.  Multiplanar CT image reconstructions of the cervical spine were also generated.  Comparison:  04/13/2007  CT HEAD  Findings: There is no intra or extra-axial fluid collection or mass lesion.  The basilar cisterns and ventricles have a normal appearance.  There is no CT evidence for acute infarction or hemorrhage.  Bone windows show mucoperiosteal thickening within the left maxillary sinus, likely chronic.  No calvarial fracture identified.  IMPRESSION: No evidence for acute  abnormality. 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  CT CERVICAL SPINE  Findings: There is normal alignment of the cervical spine.  There is no evidence for acute fracture or dislocation.  Prevertebral soft tissues have a normal appearance.  Lung apices have a normal appearance.  IMPRESSION: Negative exam.  Original Report Authenticated By: Patterson Hammersmith, M.D.   US Abdomen Complete  03/07/2011  *RADIOLOGY REPORT*  Clinical Data:  Abnormal LFTs.  Hypertension.  Prior appendectomy. The patient ate breakfast.  COMPLETE ABDOMINAL ULTRASOUND  Comparison:  05/05/2006  Findings:  Gallbladder:  No gallstones, gallbladder wall thickening, or pericholecystic fluid. Gallbladder wall is normal in thickness, 3.0 mm.  No sonographic Murphy's sign.  Common bile duct:  Common bile duct is 4.8 mm in diameter, within normal limits.  Liver:  Liver is echogenic.  There is poor definition of internal architecture.  There is poor visualization of the diaphragm.  No focal lesions identified.  IVC:  Appears normal.  Pancreas:  The pancreatic tail is not well seen because of overlying bowel gas.  The visualized portion has a normal  appearance.  Spleen:  The spleen has a normal appearance and measures 7.6 cm.  Right Kidney:  The right kidney has a normal appearance and measures 11 cm in length.  Left Kidney:  The left kidney has a normal appearance measures 10.5 cm in length.  Abdominal aorta:  The abdominal aorta is not aneurysmal, 2.4 cm maximum diameter.  IMPRESSION:  1.  Fatty liver. 2.  No evidence for hydronephrosis. 30.  No evidence for acute cholecystitis.  Original Report Authenticated By: Patterson Hammersmith, M.D.     Microbiology: Recent Results (from the past 240 hour(s))  MRSA PCR SCREENING     Status: Normal   Collection Time   03/07/11  2:50 AM      Component Value Range Status Comment   MRSA by PCR NEGATIVE  NEGATIVE  Final      Labs: Results for orders placed during the hospital encounter of 03/06/11 (from the past 48 hour(s))  CBC     Status: Normal   Collection Time   03/06/11  6:40 PM      Component Value Range Comment   WBC 9.6  4.0 - 10.5 (K/uL)    RBC 4.87  3.87 - 5.11 (MIL/uL)    Hemoglobin 14.7  12.0 - 15.0 (g/dL)    HCT 16.1  09.6 - 04.5 (%)    MCV 89.3  78.0 - 100.0 (fL)    MCH 30.2  26.0 - 34.0 (pg)    MCHC 33.8  30.0 - 36.0 (g/dL)    RDW 40.9  81.1 - 91.4 (%)    Platelets 332  150 - 400 (K/uL)   DIFFERENTIAL     Status: Abnormal   Collection Time   03/06/11  6:40 PM      Component Value Range Comment   Neutrophils Relative 40 (*) 43 - 77 (%)    Neutro Abs 3.9  1.7 - 7.7 (K/uL)    Lymphocytes Relative 48 (*) 12 - 46 (%)    Lymphs Abs 4.6 (*) 0.7 - 4.0 (K/uL)    Monocytes Relative 5  3 - 12 (%)    Monocytes Absolute 0.5  0.1 - 1.0 (K/uL)    Eosinophils Relative 5  0 - 5 (%)    Eosinophils Absolute 0.5  0.0 - 0.7 (K/uL)    Basophils Relative 2 (*) 0 - 1 (%)    Basophils Absolute 0.2 (*) 0.0 - 0.1 (K/uL)   COMPREHENSIVE METABOLIC PANEL     Status: Abnormal   Collection Time   03/06/11  6:40 PM      Component Value Range Comment   Sodium 141  135 - 145 (mEq/L)    Potassium 2.6 (*)  3.5 - 5.1 (mEq/L)    Chloride 100  96 - 112 (mEq/L)    CO2 20  19 - 32 (mEq/L)    Glucose, Bld 145 (*) 70 - 99 (mg/dL)    BUN 7  6 - 23 (mg/dL)    Creatinine, Ser 7.82  0.50 - 1.10 (mg/dL)    Calcium 9.8  8.4 - 10.5 (mg/dL)    Total Protein 7.8  6.0 - 8.3 (g/dL)    Albumin 3.6  3.5 - 5.2 (g/dL)    AST 956 (*) 0 - 37 (U/L)    ALT 155 (*) 0 - 35 (U/L)    Alkaline Phosphatase 89  39 - 117 (U/L)    Total Bilirubin 0.2 (*) 0.3 - 1.2 (mg/dL)    GFR calc non Af Amer >60  >60 (mL/min)    GFR calc Af Amer >60  >60 (mL/min)   ETHANOL     Status: Abnormal   Collection Time   03/06/11  6:40 PM      Component Value Range Comment   Alcohol, Ethyl (B) 132 (*) 0 - 11 (mg/dL)   CARDIAC PANEL(CRET KIN+CKTOT+MB+TROPI)     Status: Normal   Collection Time   03/06/11  6:40 PM      Component Value Range Comment   Total CK 80  7 - 177 (U/L)    CK, MB 2.4  0.3 - 4.0 (ng/mL)    Troponin I <0.30  <0.30 (ng/mL)    Relative Index RELATIVE INDEX IS INVALID  0.0 - 2.5    URINALYSIS, ROUTINE W REFLEX MICROSCOPIC     Status: Normal   Collection Time   03/06/11  8:30 PM      Component Value Range Comment   Color, Urine YELLOW  YELLOW     Appearance CLEAR  CLEAR     Specific Gravity, Urine 1.015  1.005 - 1.030     pH 6.0  5.0 - 8.0     Glucose, UA NEGATIVE  NEGATIVE (mg/dL)    Hgb urine dipstick NEGATIVE  NEGATIVE  Bilirubin Urine NEGATIVE  NEGATIVE     Ketones, ur NEGATIVE  NEGATIVE (mg/dL)    Protein, ur NEGATIVE  NEGATIVE (mg/dL)    Urobilinogen, UA 1.0  0.0 - 1.0 (mg/dL)    Nitrite NEGATIVE  NEGATIVE     Leukocytes, UA NEGATIVE  NEGATIVE  MICROSCOPIC NOT DONE ON URINES WITH NEGATIVE PROTEIN, BLOOD, LEUKOCYTES, NITRITE, OR GLUCOSE <1000 mg/dL.  URINE RAPID DRUG SCREEN (HOSP PERFORMED)     Status: Normal   Collection Time   03/06/11  8:30 PM      Component Value Range Comment   Opiates NONE DETECTED  NONE DETECTED     Cocaine NONE DETECTED  NONE DETECTED     Benzodiazepines NONE DETECTED  NONE DETECTED      Amphetamines NONE DETECTED  NONE DETECTED     Tetrahydrocannabinol NONE DETECTED  NONE DETECTED     Barbiturates NONE DETECTED  NONE DETECTED    MRSA PCR SCREENING     Status: Normal   Collection Time   03/07/11  2:50 AM      Component Value Range Comment   MRSA by PCR NEGATIVE  NEGATIVE    MAGNESIUM     Status: Normal   Collection Time   03/07/11  5:09 AM      Component Value Range Comment   Magnesium 1.7  1.5 - 2.5 (mg/dL)   COMPREHENSIVE METABOLIC PANEL     Status: Abnormal   Collection Time   03/07/11  5:12 AM      Component Value Range Comment   Sodium 141  135 - 145 (mEq/L)    Potassium 3.6  3.5 - 5.1 (mEq/L)    Chloride 105  96 - 112 (mEq/L)    CO2 23  19 - 32 (mEq/L)    Glucose, Bld 108 (*) 70 - 99 (mg/dL)    BUN 8  6 - 23 (mg/dL)    Creatinine, Ser 1.61  0.50 - 1.10 (mg/dL)    Calcium 8.4  8.4 - 10.5 (mg/dL)    Total Protein 6.6  6.0 - 8.3 (g/dL)    Albumin 3.1 (*) 3.5 - 5.2 (g/dL)    AST 94 (*) 0 - 37 (U/L)    ALT 116 (*) 0 - 35 (U/L)    Alkaline Phosphatase 72  39 - 117 (U/L)    Total Bilirubin 0.3  0.3 - 1.2 (mg/dL)    GFR calc non Af Amer >60  >60 (mL/min)    GFR calc Af Amer >60  >60 (mL/min)   CBC     Status: Normal   Collection Time   03/07/11  5:12 AM      Component Value Range Comment   WBC 8.8  4.0 - 10.5 (K/uL)    RBC 4.24  3.87 - 5.11 (MIL/uL)    Hemoglobin 12.9  12.0 - 15.0 (g/dL)    HCT 09.6  04.5 - 40.9 (%)    MCV 89.9  78.0 - 100.0 (fL)    MCH 30.4  26.0 - 34.0 (pg)    MCHC 33.9  30.0 - 36.0 (g/dL)    RDW 81.1  91.4 - 78.2 (%)    Platelets 233  150 - 400 (K/uL) DELTA CHECK NOTED     HPI : As reported by Dr. Osvaldo Shipper: HPI: This is a 55 year old, Caucasian female with past medical history of hypertension, acid reflux disease, depression, alcoholism, who was apparently found on the street with her left wrist cut open. She was having some bleeding  from that site. She was brought into the ED, subsequently. Patient at this time cannot remember  how she got to the street. She doesn't remember cutting herself. She tells me that she has had 3 or more drinks of bourbon and diet Pepsi today. She said this is her first drink in the last 3 months. She said she and her husband have been having constant arguments and fights. She has tried to take her own life in the past and has required psychiatric treatment. She denies any chest pain or shortness of breath. She does have a headache, but denies any focal the weakness. Denies any tingling or numbness. Denies any nausea, vomiting. She started crying when I started asking her questions about her mental state. So, there is some amnesia, here, probably because of her alcoholism. So history is limited.   HOSPITAL COURSE:  The patient was started on maintenance IV fluids. Suicide precautions were instituted. An one on one sitter was ordered. She was admitted to the ICU for closer monitoring. Potassium chloride was supplemented in the IV fluids and orally. She received several intravenous runs of potassium chloride as well. Her blood magnesium level was assessed and it was within normal limits at 1.7. The Ativan alcohol withdrawal protocol was started. Apparently, the patient been abstinent from alcohol for approximately 3 months before drinking yesterday, as reported by her. Vitamin therapy was also started. There were no signs of alcohol withdrawal syndrome during the hospital course. Her urine drug screen was negative.  For further evaluation the elevated liver transaminases, an acute viral hepatitis panel and an ultrasound of her abdomen were ordered. The ultrasound revealed a fatty liver but no evidence of acute cholecystitis. She had no complaints of abdominal pain. The results of the acute viral hepatitis panel were pending at the time of hospital discharge.  Patient was also noted to be intermittently bradycardic. A TSH and free T4 were ordered, however, the results were pending at the time of hospital  discharge. The results will need to be followed during her stay at the [facility].    She reported one episode of urethral bleeding, however, this was not examined or observed. Her initial urinalysis was unremarkable. Another urinalysis was ordered, however the result was pending at the time of hospital discharge. She reported that there was some  trauma during the in and out catheterization by the nurse in the emergency department.  Upon examination of the left wrist laceration, there was no evidence of active bleeding, active infection, or active drainage. Staples were placed by the emergency department physician prior to her admission.  The Act team was consulted. Based on their evaluation, the patient was suicidal, necessitating inpatient psychiatric treatment and management. She was given a diagnosis of major depressive disorder with recurrent severe episode. I discussed the patient with Act team representative, Samson Frederic. We are both in agreement that the patient needed inpatient psychiatric management, although, she denied being suicidal. She will be transferred to [facility] today.  She is currently hemodynamically stable and afebrile.    Blood pressure 145/76, pulse 68, temperature 98 F (36.7 C), temperature source Oral, resp. rate 18, height 5\' 3"  (1.6 m), weight 90.4 kg (199 lb 4.7 oz), SpO2 99.00%.     Discharge Orders    Future Orders Please Complete By Expires   Diet - low sodium heart healthy      Increase activity slowly      Discharge instructions  Signed: Caitlan Chauca 03/07/2011, 2:44 PM

## 2011-03-07 NOTE — Progress Notes (Signed)
Subjective: The patient is currently sitting up in bed. She has no complaints of chest pain, shortness of breath, or abdominal pain. There is a little discomfort at her left wrist but she does not currently require pain medication for it. She reports some urethral bleeding. She says that the nurse did an in and out catheterization in the emergency department which caused some trauma.  Objective: Vital signs in last 24 hours: Filed Vitals:   03/07/11 0600 03/07/11 0700 03/07/11 0800 03/07/11 0900  BP: 129/91 137/87 125/74 105/67  Pulse: 52 51 67 58  Temp:      TempSrc:      Resp: 19 19 19 16   Height:      Weight:      SpO2: 95% 94% 98% 98%    Intake/Output Summary (Last 24 hours) at 03/07/11 0935 Last data filed at 03/07/11 0208  Gross per 24 hour  Intake    200 ml  Output      0 ml  Net    200 ml    Weight change:   General appearance: alert, cooperative and moderately obese Resp: clear to auscultation bilaterally. Cardio: regular rate and rhythm, S1, S2 normal, no murmur, click, rub or gallop and normal apical impulse GI: soft, non-tender; bowel sounds normal; no masses,  no organomegaly Extremities: extremities normal, atraumatic, no cyanosis or edema Pulses: 2+ and symmetric Skin: left wrist laceration with staples in place; no drainage or bleeding. Neurologic/psychological: Patient has a flat affect. She denies suicidal intent, however, she does not recall all the events that occurred yesterday. Otherwise, cranial nerves II through XII are intact. Strength is 5 over 5 throughout. Strength is intact.  Lab Results: Basic Metabolic Panel:  Basename 03/07/11 0512 03/07/11 0509 03/06/11 1840  NA 141 -- 141  K 3.6 -- 2.6*  CL 105 -- 100  CO2 23 -- 20  GLUCOSE 108* -- 145*  BUN 8 -- 7  CREATININE 0.72 -- 0.80  CALCIUM 8.4 -- 9.8  MG -- 1.7 --  PHOS -- -- --   Liver Function Tests:  Operating Room [facility] 03/07/11 0512 03/06/11 1840  AST 94* 140*  ALT 116* 155*  ALKPHOS 72 89    BILITOT 0.3 0.2*  PROT 6.6 7.8  ALBUMIN 3.1* 3.6   No results found for this basename: LIPASE:2,AMYLASE:2 in the last 72 hours CBC:  Basename 03/07/11 0512 03/06/11 1840  WBC 8.8 9.6  NEUTROABS -- 3.9  HGB 12.9 14.7  HCT 38.1 43.5  MCV 89.9 89.3  PLT 233 332   Cardiac Enzymes:  Basename 03/06/11 1840  CKTOTAL 80  CKMB 2.4  CKMBINDEX --  TROPONINI <0.30   BNP: No results found for this basename: POCBNP:3 in the last 72 hours D-Dimer: No results found for this basename: DDIMER:2 in the last 72 hours CBG: No results found for this basename: GLUCAP:6 in the last 72 hours Hemoglobin A1C: No results found for this basename: HGBA1C in the last 72 hours Fasting Lipid Panel: No results found for this basename: CHOL,HDL,LDLCALC,TRIG,CHOLHDL,LDLDIRECT in the last 72 hours Thyroid Function Tests: No results found for this basename: TSH,T4TOTAL,FREET4,T3FREE,THYROIDAB in the last 72 hours Anemia Panel: No results found for this basename: VITAMINB12,FOLATE,FERRITIN,TIBC,IRON,RETICCTPCT in the last 72 hours Urine Drug Screen:     Micro: Recent Results (from the past 240 hour(s))  MRSA PCR SCREENING     Status: Normal   Collection Time   03/07/11  2:50 AM      Component Value Range Status Comment  MRSA by PCR NEGATIVE  NEGATIVE  Final     Studies/Results: Dg Chest 1 View  03/06/2011  *RADIOLOGY REPORT*  Clinical Data: Trauma.  Pain.  EtOH.  Left-sided hip pain.  CHEST - 1 VIEW  Comparison: 07/03/2004  Findings: Heart size is normal.  The lungs are clear.  No evidence for acute fracture, pneumothorax.  No focal consolidations or pleural effusions.  IMPRESSION: Negative exam.  Original Report Authenticated By: Patterson Hammersmith, M.D.   Dg Hip Complete Left  03/06/2011  *RADIOLOGY REPORT*  Clinical Data: Trauma.  Pain.  Left hip pain.  Altered level of consciousness.  EtOH.  LEFT HIP - COMPLETE 2+ VIEW  Comparison: None.  Findings: There is no evidence for acute fracture or  dislocation. No soft tissue foreign body or gas identified. Regional bowel gas pattern is normal.  Pelvic phleboliths are present.  IMPRESSION: Negative exam.  Original Report Authenticated By: Patterson Hammersmith, M.D.   Ct Head Wo Contrast  03/06/2011  *RADIOLOGY REPORT*  Clinical Data:  Trauma.  Pain.  Altered mental status.  CT HEAD WITHOUT CONTRAST CT CERVICAL SPINE WITHOUT CONTRAST  Technique:  Multidetector CT imaging of the head and cervical spine was performed following the standard protocol without intravenous contrast.  Multiplanar CT image reconstructions of the cervical spine were also generated.  Comparison:  04/13/2007  CT HEAD  Findings: There is no intra or extra-axial fluid collection or mass lesion.  The basilar cisterns and ventricles have a normal appearance.  There is no CT evidence for acute infarction or hemorrhage.  Bone windows show mucoperiosteal thickening within the left maxillary sinus, likely chronic.  No calvarial fracture identified.  IMPRESSION: No evidence for acute  abnormality. 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  CT CERVICAL SPINE  Findings: There is normal alignment of the cervical spine.  There is no evidence for acute fracture or dislocation.  Prevertebral soft tissues have a normal appearance.  Lung apices have a normal appearance.  IMPRESSION: Negative exam.  Original Report Authenticated By: Patterson Hammersmith, M.D.   Ct Cervical Spine Wo Contrast  03/06/2011  *RADIOLOGY REPORT*  Clinical Data:  Trauma.  Pain.  Altered mental status.  CT HEAD WITHOUT CONTRAST CT CERVICAL SPINE WITHOUT CONTRAST  Technique:  Multidetector CT imaging of the head and cervical spine was performed following the standard protocol without intravenous contrast.  Multiplanar CT image reconstructions of the cervical spine were also generated.  Comparison:  04/13/2007  CT HEAD  Findings: There is no intra or extra-axial fluid collection or mass lesion.  The basilar cisterns and ventricles  have a normal appearance.  There is no CT evidence for acute infarction or hemorrhage.  Bone windows show mucoperiosteal thickening within the left maxillary sinus, likely chronic.  No calvarial fracture identified.  IMPRESSION: No evidence for acute  abnormality. 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  CT CERVICAL SPINE  Findings: There is normal alignment of the cervical spine.  There is no evidence for acute fracture or dislocation.  Prevertebral soft tissues have a normal appearance.  Lung apices have a normal appearance.  IMPRESSION: Negative exam.  Original Report Authenticated By: Patterson Hammersmith, M.D.    Medications: I have reviewed the patient's current medications.  Assessment:  1. History of depression with likely suicidal behavior/intent. In one-on-one sitter has been ordered for suicide precautions. Although the patient states that she is and was not suicidal, I'm not sure if I believe this. Given her history, it is likely that  she gestured suicide. An Act team consult has been ordered for further evaluation.  2. Laceration of her left wrist. Status post staples performed by the emergency department physician. No active bleeding and no active infection noted.  3. Hypokalemia. She has been repleted with potassium chloride orally and in the IV fluids. She was also given intravenous runs. Her serum potassium has normalized. Her blood magnesium level was 1.7.  4. Hypertension. Currently stable.  5. Bradycardia.  Her thyroid function will need to be assessed.  6. Alcoholism. She had been abstinent from alcohol use for 3 months up until yesterday. She has been started on vitamin therapy and the Ativan alcohol withdrawal protocol. There are no signs of alcohol withdrawal syndrome currently.  7. Hepatic transaminitis/elevated LFTs. This likely secondary to alcohol abuse. An acute viral hepatitis panel and ultrasound of the abdomen have been ordered.  8. Tobacco abuse. She was  advised to stop smoking. She was offered a nicotine patch however she declined.   9. Reported urethral bleeding. Apparently,  the patient had some Foley trauma in the emergency department.   Plan:  1. The act team has been consulted. 2. If her ultrasound results are nonacute, she could be discharged pending the results of the viral hepatitis panel, TSH, and free T4. These lab studies do not necessarily have to be resulted prior to the patient's discharge or transfer to an inpatient psychiatric unit, which is recommended by the dictating physician. 3. We will check a urinalysis and culture. 4. We will advance her diet as tolerated.     LOS: 1 day   Gid Schoffstall 03/07/2011, 9:35 AM

## 2011-03-08 DIAGNOSIS — F1010 Alcohol abuse, uncomplicated: Secondary | ICD-10-CM

## 2011-03-08 DIAGNOSIS — F329 Major depressive disorder, single episode, unspecified: Secondary | ICD-10-CM

## 2011-03-10 LAB — GLUCOSE, CAPILLARY: Glucose-Capillary: 104 mg/dL — ABNORMAL HIGH (ref 70–99)

## 2011-03-10 LAB — HEPATITIS PANEL, ACUTE: Hep B C IgM: NEGATIVE

## 2011-03-11 NOTE — Progress Notes (Signed)
Encounter addended by: Ree Shay, RN on: 03/11/2011  8:01 PM<BR>     Documentation filed: Charges VN
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
## 2011-03-17 NOTE — Assessment & Plan Note (Signed)
Julia [name], Julia [name] NO.:  [id number]  MEDICAL RECORD NO.:  [id number]  LOCATION:  0301                          FACILITY:  [facility]  PHYSICIAN:  Orson Aloe, MD       DATE OF BIRTH:  12-05-1955  DATE OF ADMISSION:  03/07/2011 DATE OF DISCHARGE:                      PSYCHIATRIC ADMISSION ASSESSMENT   HISTORY OF PRESENT ILLNESS:  This is a 55 year old married white female. The commitment papers indicate that she is depressed, that she did do harm to herself by making serious cuts to her wrists.  Indeed, she has four staples, left wrist.  She was threatening to leave the hospital before receiving medical treatment and hence she was put on commitment. Apparently, she had been found wandering and intoxicated.  She did something similar back in 2010, and at that time she tried to inject something into her vein in a suicide attempt, and hence she was put on petition and placed on medical floor overnight to stabilize her, also her potassium was low it was 2.6.  [facility] after she was found to be medically stable at [facility].  Today, she is stating that she does not do well in group, that she would like to have one-to-one therapy.  Also, she has not been sleeping well lately.  She was here in 2010 with a similar situation.  She had attempted suicide by injection of an unknown substance.  She was quite intoxicated at the time as well and was discharged.  She was discharged on the Celexa after undergoing a Librium detox.  She denies any further follow-up psychiatrically.  SOCIAL HISTORY:  She received her associates degree in 78.  She has been married twice; this time for 21 years.  Her oldest is a girl 29. She has another daughter 74 and her baby is 52, a boy at home.  She is not employed.  FAMILY HISTORY:  She states his mother and sister have depression.  She also has a sister who has alcohol  issues.  ALCOHOL AND DRUG HISTORY:  She states that this was the first alcohol she had, had in 3 months; unfortunately it dis-inhibited her and her alcohol level was 132 in the emergency room.  PRIMARY CARE PROVIDER:  Dr. Gerda Diss.  MEDICAL PROBLEMS:  She is known to have hypertension, GERD.  She currently has staples in her left wrist from a self-inflicted injury.  MEDICATIONS:  She is usually on Celexa 40 mg p.o. daily.  At the time of transfer from [facility] she was also on folic acid 1 tablet p.o. daily, ibuprofen 200 mg p.o. q.6 h p.r.n.  She was also on Zestoretic 20/25 mg p.o. daily for hypertension. Protonix 40 mg p.o. daily and potassium chloride SA 20 mEq 1 tablet p.o. daily, thiamine 100 mg p.o. daily.  DRUG ALLERGIES:  She is allergic to Ceftin, codeine, penicillin and sulfa.  POSITIVE PHYSICAL FINDINGS:  Her physical exam is well-documented and on the chart.  She was held overnight in the medical unit at [facility].  MENTAL STATUS EXAM:  Today she is alert but drowsy.  She finally got sleep from  some Seroquel last night.  Her speech is a little thick again because she is drowsy.  Her mood is calm.  Her affect has a decreased range.  Her thought processes are clear, rational and goal-oriented. She would like to get into some one-to-one therapy.  Judgment and insight are fair.  Concentration and memory superficially are intact. Intelligence is average.  She denies any memory of trying to hurt herself, the last she remembers she was in her garden.  She denies being suicidal or homicidal.  She denies any auditory or visual hallucinations  DIAGNOSIS:  AXIS I:  Alcohol abuse resulting in blackout, major depressive disorder recurrent severe without psychotic features. AXIS II:  Deferred. AXIS III:  Hypertension, acid reflux, obesity. AXIS IV:  Alcohol abuse and severe marital issues. AXIS V:  30.  PLAN:  The plan is to admit for safety and stabilization.  Her medications  will be adjusted as indicated.  She was just given Librium 25 mg p.o. q.i.d. for 24 hours as she is not abusing alcohol on a regular basis and will set her up with outpatient therapy.  Estimated length of stay is 2-3 days.     Mickie Leonarda Salon, P.A.-C.   ______________________________ Orson Aloe, MD    MD/MEDQ  D:  03/08/2011  T:  03/08/2011  Job:  [id number]  cc:   Lorin Picket A. Gerda Diss, MD Fax: [phone]  Electronically Signed by Jaci Lazier ADAMS P.A.-C. on 03/14/2011 11:39:22 AM Electronically Signed by Orson Aloe  on 03/17/2011 05:51:42 PM

## 2011-04-02 NOTE — Progress Notes (Signed)
Encounter addended by: Clarene Critchley on: 04/02/2011  8:31 AM<BR>     Documentation filed: Flowsheet VN

## 2011-05-15 LAB — CSF CULTURE W GRAM STAIN
Culture: NO GROWTH
Gram Stain: NONE SEEN

## 2011-05-15 LAB — BASIC METABOLIC PANEL
BUN: 7
CO2: 29
CO2: 33 — ABNORMAL HIGH
Calcium: 8.5
Calcium: 8.8
Chloride: 108
Chloride: 109
GFR calc non Af Amer: >60
GFR calc non Af Amer: >60
Glucose, Bld: 104 — ABNORMAL HIGH
Glucose, Bld: 108 — ABNORMAL HIGH
Glucose, Bld: 98
Potassium: 3.6
Sodium: 139
Sodium: 144

## 2011-05-15 LAB — CSF CELL COUNT WITH DIFFERENTIAL
RBC Count, CSF: 0
Tube #: 1
Tube #: 4
WBC, CSF: 0

## 2011-05-15 LAB — URINALYSIS, ROUTINE W REFLEX MICROSCOPIC
Bilirubin Urine: NEGATIVE
Glucose, UA: NEGATIVE
Hgb urine dipstick: NEGATIVE
Ketones, ur: NEGATIVE
Nitrite: NEGATIVE
Protein, ur: NEGATIVE
Specific Gravity, Urine: 1.01
Urobilinogen, UA: 0.2
pH: 6

## 2011-05-15 LAB — CBC
HCT: 37.7
HCT: 42.5
Hemoglobin: 12.6
Hemoglobin: 14.3
MCHC: 33.5
MCHC: 33.7
MCV: 88
MCV: 89.2
Platelets: 239
Platelets: 268
RBC: 4.83
RDW: 12.8
RDW: 12.9
WBC: 6.7

## 2011-05-15 LAB — DIFFERENTIAL
Basophils Absolute: 0.1
Basophils Absolute: 0.1
Basophils Relative: 1
Eosinophils Absolute: 0.4
Eosinophils Absolute: 0.5
Eosinophils Relative: 6 — ABNORMAL HIGH
Eosinophils Relative: 7 — ABNORMAL HIGH
Lymphocytes Relative: 40
Lymphs Abs: 2.7
Monocytes Absolute: 0.7
Monocytes Absolute: 0.7
Monocytes Relative: 10
Neutro Abs: 2.9
Neutrophils Relative %: 43

## 2011-05-15 LAB — CULTURE, BLOOD (ROUTINE X 2): Report Status: 9152008

## 2011-05-15 LAB — PROTEIN AND GLUCOSE, CSF: Glucose, CSF: 62

## 2011-05-15 LAB — URINE CULTURE
Colony Count: NO GROWTH
Culture: NO GROWTH

## 2011-05-15 LAB — VANCOMYCIN, TROUGH: Vancomycin Tr: 10.9

## 2012-05-12 ENCOUNTER — Other Ambulatory Visit: Payer: Self-pay | Admitting: Family Medicine

## 2012-05-12 ENCOUNTER — Ambulatory Visit (HOSPITAL_COMMUNITY)
Admission: RE | Admit: 2012-05-12 | Discharge: 2012-05-12 | Disposition: A | Discharge status: Home / Self Care | Payer: Managed Care, Other (non HMO) | Source: Ambulatory Visit | Attending: Family Medicine | Admitting: Family Medicine

## 2012-05-12 DIAGNOSIS — M25511 Pain in right shoulder: Secondary | ICD-10-CM

## 2012-05-12 DIAGNOSIS — M25519 Pain in unspecified shoulder: Secondary | ICD-10-CM | POA: Insufficient documentation

## 2012-09-09 ENCOUNTER — Emergency Department (HOSPITAL_COMMUNITY)
Admission: EM | Admit: 2012-09-09 | Discharge: 2012-09-09 | Disposition: A | Discharge status: Home / Self Care | Payer: Managed Care, Other (non HMO) | Attending: Emergency Medicine | Admitting: Emergency Medicine

## 2012-09-09 ENCOUNTER — Encounter (HOSPITAL_COMMUNITY): Payer: Self-pay | Admitting: *Deleted

## 2012-09-09 ENCOUNTER — Emergency Department (HOSPITAL_COMMUNITY): Payer: Managed Care, Other (non HMO)

## 2012-09-09 DIAGNOSIS — F17200 Nicotine dependence, unspecified, uncomplicated: Secondary | ICD-10-CM | POA: Insufficient documentation

## 2012-09-09 DIAGNOSIS — F323 Major depressive disorder, single episode, severe with psychotic features: Secondary | ICD-10-CM | POA: Insufficient documentation

## 2012-09-09 DIAGNOSIS — F1010 Alcohol abuse, uncomplicated: Secondary | ICD-10-CM | POA: Insufficient documentation

## 2012-09-09 DIAGNOSIS — R630 Anorexia: Secondary | ICD-10-CM | POA: Insufficient documentation

## 2012-09-09 DIAGNOSIS — E876 Hypokalemia: Secondary | ICD-10-CM | POA: Insufficient documentation

## 2012-09-09 DIAGNOSIS — R0602 Shortness of breath: Secondary | ICD-10-CM | POA: Insufficient documentation

## 2012-09-09 DIAGNOSIS — K219 Gastro-esophageal reflux disease without esophagitis: Secondary | ICD-10-CM | POA: Insufficient documentation

## 2012-09-09 DIAGNOSIS — R059 Cough, unspecified: Secondary | ICD-10-CM | POA: Insufficient documentation

## 2012-09-09 DIAGNOSIS — J40 Bronchitis, not specified as acute or chronic: Secondary | ICD-10-CM

## 2012-09-09 DIAGNOSIS — E669 Obesity, unspecified: Secondary | ICD-10-CM | POA: Insufficient documentation

## 2012-09-09 DIAGNOSIS — F411 Generalized anxiety disorder: Secondary | ICD-10-CM | POA: Insufficient documentation

## 2012-09-09 DIAGNOSIS — Z79899 Other long term (current) drug therapy: Secondary | ICD-10-CM | POA: Insufficient documentation

## 2012-09-09 DIAGNOSIS — R079 Chest pain, unspecified: Secondary | ICD-10-CM | POA: Insufficient documentation

## 2012-09-09 DIAGNOSIS — R1110 Vomiting, unspecified: Secondary | ICD-10-CM | POA: Insufficient documentation

## 2012-09-09 DIAGNOSIS — R05 Cough: Secondary | ICD-10-CM | POA: Insufficient documentation

## 2012-09-09 DIAGNOSIS — I10 Essential (primary) hypertension: Secondary | ICD-10-CM | POA: Insufficient documentation

## 2012-09-09 DIAGNOSIS — Z8669 Personal history of other diseases of the nervous system and sense organs: Secondary | ICD-10-CM | POA: Insufficient documentation

## 2012-09-09 LAB — CBC WITH DIFFERENTIAL/PLATELET
Basophils Relative: 1 % (ref 0–1)
Eosinophils Absolute: 0.1 10*3/uL (ref 0.0–0.7)
MCH: 29.3 pg (ref 26.0–34.0)
MCHC: 36.2 g/dL — ABNORMAL HIGH (ref 30.0–36.0)
Neutrophils Relative %: 55 % (ref 43–77)
Platelets: 375 10*3/uL (ref 150–400)

## 2012-09-09 LAB — URINALYSIS, ROUTINE W REFLEX MICROSCOPIC
Nitrite: NEGATIVE
Specific Gravity, Urine: 1.025 (ref 1.005–1.030)
Urobilinogen, UA: 1 mg/dL (ref 0.0–1.0)

## 2012-09-09 LAB — BASIC METABOLIC PANEL
BUN: 12 mg/dL (ref 6–23)
Calcium: 9.8 mg/dL (ref 8.4–10.5)
GFR calc Af Amer: 53 mL/min — ABNORMAL LOW (ref >90)
GFR calc non Af Amer: 46 mL/min — ABNORMAL LOW (ref >90)
Potassium: 2.4 mEq/L — CL (ref 3.5–5.1)
Sodium: 134 mEq/L — ABNORMAL LOW (ref 135–145)

## 2012-09-09 LAB — URINE MICROSCOPIC-ADD ON

## 2012-09-09 MED ORDER — IPRATROPIUM BROMIDE 0.02 % IN SOLN
0.5000 mg | Freq: Once | RESPIRATORY_TRACT | Status: AC
  Administered 2012-09-09: 0.5 mg via RESPIRATORY_TRACT
  Filled 2012-09-09: qty 2.5

## 2012-09-09 MED ORDER — SODIUM CHLORIDE 0.9 % IV SOLN
INTRAVENOUS | Status: DC
  Administered 2012-09-09: via INTRAVENOUS

## 2012-09-09 MED ORDER — SODIUM CHLORIDE 0.9 % IV BOLUS (SEPSIS)
1000.0000 mL | Freq: Once | INTRAVENOUS | Status: AC
  Administered 2012-09-09: 1000 mL via INTRAVENOUS

## 2012-09-09 MED ORDER — POTASSIUM CHLORIDE 10 MEQ/100ML IV SOLN
10.0000 meq | INTRAVENOUS | Status: DC
  Filled 2012-09-09: qty 100

## 2012-09-09 MED ORDER — PREDNISONE 20 MG PO TABS: 20.0000 mg | ORAL_TABLET | Freq: Two times a day (BID) | ORAL | Status: DC

## 2012-09-09 MED ORDER — PREDNISONE 50 MG PO TABS
60.0000 mg | ORAL_TABLET | Freq: Once | ORAL | Status: AC
  Administered 2012-09-09: 60 mg via ORAL
  Filled 2012-09-09: qty 1

## 2012-09-09 MED ORDER — POTASSIUM CHLORIDE CRYS ER 20 MEQ PO TBCR
40.0000 meq | EXTENDED_RELEASE_TABLET | Freq: Once | ORAL | Status: AC
  Administered 2012-09-09: 40 meq via ORAL
  Filled 2012-09-09: qty 2

## 2012-09-09 MED ORDER — IPRATROPIUM BROMIDE 0.02 % IN SOLN: 0.5000 mg | Freq: Once | RESPIRATORY_TRACT | Status: DC

## 2012-09-09 MED ORDER — ALBUTEROL SULFATE (5 MG/ML) 0.5% IN NEBU
5.0000 mg | INHALATION_SOLUTION | Freq: Once | RESPIRATORY_TRACT | Status: AC
  Administered 2012-09-09: 5 mg via RESPIRATORY_TRACT
  Filled 2012-09-09: qty 1

## 2012-09-09 MED ORDER — ALBUTEROL SULFATE (5 MG/ML) 0.5% IN NEBU
2.5000 mg | INHALATION_SOLUTION | Freq: Once | RESPIRATORY_TRACT | Status: DC
Start: 2012-09-09 — End: 2012-09-10
  Filled 2012-09-09 (×2): qty 0.5

## 2012-09-09 MED ORDER — ALBUTEROL SULFATE (5 MG/ML) 0.5% IN NEBU
5.0000 mg | INHALATION_SOLUTION | Freq: Once | RESPIRATORY_TRACT | Status: AC
  Administered 2012-09-09: 5 mg via RESPIRATORY_TRACT

## 2012-09-09 NOTE — ED Provider Notes (Signed)
History   This chart was scribed for Flint Melter, MD by Toya Smothers, ED Scribe. The patient was seen in room APA05/APA05. Patient's care was started at 1951.  CSN: [id number]  Arrival date & time 09/09/12  1951   First MD Initiated Contact with Patient 09/09/12 2024      Chief Complaint  Patient presents with  . Bronchitis    HPI  Julia [name] is a 57 y.o. female with h/o HTN, GERD, and meningitis, who presents to the Emergency Department complaining of 2 days of of worsening, constant, unchanged, moderate sputum-productive-cough and chest pain, with emesis. Pain is progressing superiorly, worse with cough and exertion, and alleviated by nothing. Pt was dx with Bronchitis 5 days ago by Dr. Gerda Diss and Rx Lisinopril and Albuterol. Despite treatment with Lisinopril, Pt reports no improvement to symptoms. For the past 2 days, Pt has been spontaneously vomiting sputum, and reports increased SOB after exertion and decreased appetite. No leg swelling, palpations, diarrhea, or fever. Pt is an everyday smoker, denying the use of alcohol and illicit drugs. No h/o asthma, intermittent steroid use, or prior hospitalization. Surgical Hx includes Appendectomy.   Past Medical History  Diagnosis Date  . ETOH abuse   . Meningitis   . Depression, major     Suicide attempt w/ injection of KCL  . Hypertension   . GERD (gastroesophageal reflux disease)   . Anxiety disorder   . Obesity     Past Surgical History  Procedure Date  . Appendectomy     History reviewed. No pertinent family history.  History  Substance Use Topics  . Smoking status: Current Every Day Smoker -- 1.0 packs/day  . Smokeless tobacco: Not on file  . Alcohol Use: No    Review of Systems  Respiratory: Positive for cough and shortness of breath.   Cardiovascular: Positive for chest pain.  Gastrointestinal: Positive for nausea and vomiting. Negative for diarrhea.  All other systems reviewed and are  negative.    Allergies  Ceftin  Home Medications   Current Outpatient Rx  Name  Route  Sig  Dispense  Refill  . ALBUTEROL SULFATE HFA 108 (90 BASE) MCG/ACT IN AERS   Inhalation   Inhale 2 puffs into the lungs every 6 (six) hours as needed.         Marland Kitchen CITALOPRAM HYDROBROMIDE 40 MG PO TABS   Oral   Take 40 mg by mouth daily.           . IBUPROFEN 200 MG PO TABS   Oral   Take 200 mg by mouth every 6 (six) hours as needed. For headache         . LEVOFLOXACIN 500 MG PO TABS   Oral   Take 500 mg by mouth daily. For 10 days         . LISINOPRIL-HYDROCHLOROTHIAZIDE 20-25 MG PO TABS   Oral   Take 1 tablet by mouth daily.           Marland Kitchen PANTOPRAZOLE SODIUM 40 MG PO TBEC   Oral   Take 40 mg by mouth daily.           Marland Kitchen PREDNISONE 20 MG PO TABS   Oral   Take 1 tablet (20 mg total) by mouth 2 (two) times daily.   10 tablet   0     BP 107/72  Pulse 77  Temp 98.1 F (36.7 C) (Oral)  Resp 20  Ht 5\' 2"  (1.575 m)  Wt 174 lb 4 oz (79.039 kg)  BMI 31.87 kg/m2  SpO2 96%  Physical Exam  Nursing note and vitals reviewed. Constitutional: She is oriented to person, place, and time. She appears well-developed and well-nourished.  HENT:  Head: Normocephalic and atraumatic.  Right Ear: External ear normal.  Left Ear: External ear normal.  Eyes: Conjunctivae normal and EOM are normal. Pupils are equal, round, and reactive to light.  Neck: Normal range of motion and phonation normal. Neck supple.  Cardiovascular: Normal rate, regular rhythm, normal heart sounds and intact distal pulses.   Pulmonary/Chest: Effort normal and breath sounds normal. She exhibits no bony tenderness.  Abdominal: Soft. Normal appearance. There is no tenderness.  Musculoskeletal: Normal range of motion.  Neurological: She is alert and oriented to person, place, and time. She has normal strength. No cranial nerve deficit or sensory deficit. She exhibits normal muscle tone. Coordination normal.   Skin: Skin is warm, dry and intact.  Psychiatric: She has a normal mood and affect. Her behavior is normal. Judgment and thought content normal.    ED Course  Procedures DIAGNOSTIC STUDIES: Oxygen Saturation is 96% on room air, adequate by my interpretation.    COORDINATION OF CARE: 20:38- Evaluated Pt. Pt is awake, alert, and without distress. 20:45- Patient informed of clinical course, understand medical decision-making process, and agree with plan. 20:46- Ordered DG Chest 2 View 1 time imaging. Ordered Basic metabolic panel, CBC with Differential, and Urinalysis, Routine w reflex microscopic. 21:00- Ordered albuterol (PROVENTIL) (5 MG/ML) 0.5% nebulizer solution 5 mg, ipratropium (ATROVENT) nebulizer solution 0.5 mg, and sodium chloride 0.9 % bolus 1,000 mL Once. Ordered 0.9 % sodium chloride infusion Continuous.    Labs Reviewed  CBC WITH DIFFERENTIAL - Abnormal; Notable for the following:    WBC 11.0 (*)     RBC 5.35 (*)     Hemoglobin 15.7 (*)     MCHC 36.2 (*)     Lymphs Abs 4.1 (*)     All other components within normal limits  BASIC METABOLIC PANEL - Abnormal; Notable for the following:    Sodium 134 (*)     Potassium 2.4 (*)     Chloride 92 (*)     Glucose, Bld 117 (*)     Creatinine, Ser 1.29 (*)     GFR calc non Af Amer 46 (*)     GFR calc Af Amer 53 (*)     All other components within normal limits  URINALYSIS, ROUTINE W REFLEX MICROSCOPIC - Abnormal; Notable for the following:    Bilirubin Urine SMALL (*)     Ketones, ur TRACE (*)     Protein, ur TRACE (*)     All other components within normal limits  URINE MICROSCOPIC-ADD ON - Abnormal; Notable for the following:    Squamous Epithelial / LPF FEW (*)     Bacteria, UA MANY (*)     All other components within normal limits  URINE CULTURE   Dg Chest 2 View  09/09/2012  *RADIOLOGY REPORT*  Clinical Data: Cough, congestion, nausea/vomiting  CHEST - 2 VIEW  Comparison: 03/06/2011  Findings: Lungs are clear. No  pleural effusion or pneumothorax.  Cardiomediastinal silhouette is within normal limits.  Visualized osseous structures are within normal limits.  IMPRESSION: No evidence of acute cardiopulmonary disease.   Original Report Authenticated By: Charline Bills, M.D.    Nursing notes, applicable records and vitals reviewed.  Radiologic Images/Reports reviewed.   1. Bronchitis   2. Hypokalemia  MDM  Bronchitis, with incidental hypokalemia. Doubt pneumonia, PE, ACS. Doubt metabolic instability, serious bacterial infection or impending vascular collapse; the patient is stable for discharge.      I personally performed the services described in this documentation, which was scribed in my presence. The recorded information has been reviewed and is accurate.    Plan: Home Medications- prednisone; Home Treatments- rest; Recommended follow up- PCP, when necessary   Flint Melter, MD 09/09/12 2326

## 2012-09-09 NOTE — ED Notes (Signed)
Cough,vomiting, being tx for bronchitis,  Pain lt chest with cough.

## 2012-09-09 NOTE — ED Notes (Signed)
CRITICAL VALUE ALERT  Critical value received:  k2.4  Date of notification:  09/09/12  Time of notification:  2141  Critical value read back:yes  Nurse who received alert:  P.Kealii Thueson,rn  MD notified (1st page):    Time of first page:  Effie Shy  MD notified (2nd page):  Time of second page:  Responding MD: Effie Shy  Time MD responded:  2142

## 2012-09-12 LAB — URINE CULTURE

## 2012-10-22 ENCOUNTER — Other Ambulatory Visit: Payer: Self-pay | Admitting: Family Medicine

## 2012-11-18 ENCOUNTER — Other Ambulatory Visit: Payer: Self-pay | Admitting: Family Medicine

## 2012-12-01 ENCOUNTER — Other Ambulatory Visit: Payer: Self-pay | Admitting: Family Medicine

## 2012-12-02 ENCOUNTER — Encounter: Payer: Self-pay | Admitting: *Deleted

## 2012-12-17 ENCOUNTER — Other Ambulatory Visit: Payer: Self-pay | Admitting: Family Medicine

## 2012-12-29 ENCOUNTER — Other Ambulatory Visit: Payer: Self-pay | Admitting: Family Medicine

## 2013-02-03 ENCOUNTER — Other Ambulatory Visit: Payer: Self-pay | Admitting: Family Medicine

## 2013-02-14 ENCOUNTER — Other Ambulatory Visit: Payer: Self-pay | Admitting: *Deleted

## 2013-02-14 MED ORDER — PANTOPRAZOLE SODIUM 40 MG PO TBEC: 40.0000 mg | DELAYED_RELEASE_TABLET | Freq: Every day | ORAL | Status: DC

## 2013-02-20 ENCOUNTER — Other Ambulatory Visit: Payer: Self-pay | Admitting: *Deleted

## 2013-02-20 MED ORDER — PANTOPRAZOLE SODIUM 40 MG PO TBEC: 40.0000 mg | DELAYED_RELEASE_TABLET | Freq: Every day | ORAL | Status: DC

## 2013-03-12 ENCOUNTER — Other Ambulatory Visit: Payer: Self-pay | Admitting: Family Medicine

## 2013-03-13 ENCOUNTER — Other Ambulatory Visit: Payer: Self-pay

## 2013-03-13 MED ORDER — POTASSIUM CHLORIDE CRYS ER 10 MEQ PO TBCR: EXTENDED_RELEASE_TABLET | ORAL | Status: DC

## 2013-03-13 MED ORDER — CITALOPRAM HYDROBROMIDE 40 MG PO TABS: ORAL_TABLET | ORAL | Status: DC

## 2013-04-16 ENCOUNTER — Other Ambulatory Visit: Payer: Self-pay | Admitting: Family Medicine

## 2013-04-17 ENCOUNTER — Other Ambulatory Visit: Payer: Self-pay | Admitting: Family Medicine

## 2013-04-18 ENCOUNTER — Other Ambulatory Visit: Payer: Self-pay | Admitting: Family Medicine

## 2013-04-30 ENCOUNTER — Other Ambulatory Visit: Payer: Self-pay | Admitting: Family Medicine

## 2013-05-15 ENCOUNTER — Other Ambulatory Visit: Payer: Self-pay | Admitting: Family Medicine

## 2013-06-02 ENCOUNTER — Other Ambulatory Visit: Payer: Self-pay | Admitting: Family Medicine

## 2013-06-04 ENCOUNTER — Other Ambulatory Visit: Payer: Self-pay | Admitting: Family Medicine

## 2013-06-06 ENCOUNTER — Telehealth: Payer: Self-pay | Admitting: Family Medicine

## 2013-06-06 MED ORDER — POTASSIUM CHLORIDE CRYS ER 10 MEQ PO TBCR: EXTENDED_RELEASE_TABLET | ORAL | Status: DC

## 2013-06-06 MED ORDER — CITALOPRAM HYDROBROMIDE 40 MG PO TABS: ORAL_TABLET | ORAL | Status: DC

## 2013-06-06 MED ORDER — PANTOPRAZOLE SODIUM 40 MG PO TBEC: 40.0000 mg | DELAYED_RELEASE_TABLET | Freq: Every day | ORAL | Status: DC

## 2013-06-06 MED ORDER — LISINOPRIL-HYDROCHLOROTHIAZIDE 20-25 MG PO TABS: ORAL_TABLET | ORAL | Status: DC

## 2013-06-06 NOTE — Telephone Encounter (Signed)
Patient needs Rx for lisinopril, Celexa, Protonics, Potassium. She is almost out of medication and has scheduled an appointment for 06/14/13 for a medication check. Please call patient when complete.  [facility]

## 2013-06-06 NOTE — Telephone Encounter (Signed)
Medication sent to pharmacy in Epic. Patient was notified.  

## 2013-06-14 ENCOUNTER — Encounter: Payer: Self-pay | Admitting: Family Medicine

## 2013-06-14 ENCOUNTER — Ambulatory Visit (INDEPENDENT_AMBULATORY_CARE_PROVIDER_SITE_OTHER): Payer: Managed Care, Other (non HMO) | Admitting: Family Medicine

## 2013-06-14 VITALS — BP 132/88 | Temp 98.4°F | Ht 62.0 in | Wt 193.0 lb

## 2013-06-14 DIAGNOSIS — R7301 Impaired fasting glucose: Secondary | ICD-10-CM

## 2013-06-14 DIAGNOSIS — Z79899 Other long term (current) drug therapy: Secondary | ICD-10-CM

## 2013-06-14 DIAGNOSIS — R945 Abnormal results of liver function studies: Secondary | ICD-10-CM

## 2013-06-14 DIAGNOSIS — R7989 Other specified abnormal findings of blood chemistry: Secondary | ICD-10-CM

## 2013-06-14 DIAGNOSIS — I10 Essential (primary) hypertension: Secondary | ICD-10-CM

## 2013-06-14 MED ORDER — LISINOPRIL-HYDROCHLOROTHIAZIDE 20-25 MG PO TABS: ORAL_TABLET | ORAL | Status: DC

## 2013-06-14 MED ORDER — DOXYCYCLINE HYCLATE 100 MG PO CAPS: 100.0000 mg | ORAL_CAPSULE | Freq: Two times a day (BID) | ORAL | Status: DC

## 2013-06-14 MED ORDER — VARENICLINE TARTRATE 1 MG PO TABS: 1.0000 mg | ORAL_TABLET | Freq: Two times a day (BID) | ORAL | Status: DC

## 2013-06-14 NOTE — Patient Instructions (Signed)
You Can Quit Smoking If you are ready to quit smoking or are thinking about it, congratulations! You have chosen to help yourself be healthier and live longer! There are lots of different ways to quit smoking. Nicotine gum, nicotine patches, a nicotine inhaler, or nicotine nasal spray can help with physical craving. Hypnosis, support groups, and medicines help break the habit of smoking. TIPS TO GET OFF AND STAY OFF CIGARETTES  Learn to predict your moods. Do not let a bad situation be your excuse to have a cigarette. Some situations in your life might tempt you to have a cigarette.  Ask friends and co-workers not to smoke around you.  Make your home smoke-free.  Never have "just one" cigarette. It leads to wanting another and another. Remind yourself of your decision to quit.  On a card, make a list of your reasons for not smoking. Read it at least the same number of times a day as you have a cigarette. Tell yourself everyday, "I do not want to smoke. I choose not to smoke."  Ask someone at home or work to help you with your plan to quit smoking.  Have something planned after you eat or have a cup of coffee. Take a walk or get other exercise to perk you up. This will help to keep you from overeating.  Try a relaxation exercise to calm you down and decrease your stress. Remember, you may be tense and nervous the first two weeks after you quit. This will pass.  Find new activities to keep your hands busy. Play with a pen, coin, or rubber band. Doodle or draw things on paper.  Brush your teeth right after eating. This will help cut down the craving for the taste of tobacco after meals. You can try mouthwash too.  Try gum, breath mints, or diet candy to keep something in your mouth. IF YOU SMOKE AND WANT TO QUIT:  Do not stock up on cigarettes. Never buy a carton. Wait until one pack is finished before you buy another.  Never carry cigarettes with you at work or at home.  Keep cigarettes  as far away from you as possible. Leave them with someone else.  Never carry matches or a lighter with you.  Ask yourself, "Do I need this cigarette or is this just a reflex?"  Bet with someone that you can quit. Put cigarette money in a piggy bank every morning. If you smoke, you give up the money. If you do not smoke, by the end of the week, you keep the money.  Keep trying. It takes 21 days to change a habit!  Talk to your doctor about using medicines to help you quit. These include nicotine replacement gum, lozenges, or skin patches. Document Released: 05/16/2009 Document Revised: 10/12/2011 Document Reviewed: 05/16/2009 [facility] Patient Information 2014 [place], [place]. DASH Diet The DASH diet stands for "Dietary Approaches to Stop Hypertension." It is a healthy eating plan that has been shown to reduce high blood pressure (hypertension) in as little as 14 days, while also possibly providing other significant health benefits. These other health benefits include reducing the risk of breast cancer after menopause and reducing the risk of type 2 diabetes, heart disease, colon cancer, and stroke. Health benefits also include weight loss and slowing kidney failure in patients with chronic kidney disease.  DIET GUIDELINES  Limit salt (sodium). Your diet should contain less than 1500 mg of sodium daily.  Limit refined or processed carbohydrates. Your diet should include  mostly whole grains. Desserts and added sugars should be used sparingly.  Include small amounts of heart-healthy fats. These types of fats include nuts, oils, and tub margarine. Limit saturated and trans fats. These fats have been shown to be harmful in the body. CHOOSING FOODS  The following food groups are based on a 2000 calorie diet. See your Registered Dietitian for individual calorie needs. Grains and Grain Products (6 to 8 servings daily)  Eat More Often: Whole-wheat bread, brown rice, whole-grain or wheat pasta,  quinoa, popcorn without added fat or salt (air popped).  Eat Less Often: White bread, white pasta, white rice, cornbread. Vegetables (4 to 5 servings daily)  Eat More Often: Fresh, frozen, and canned vegetables. Vegetables may be raw, steamed, roasted, or grilled with a minimal amount of fat.  Eat Less Often/Avoid: Creamed or fried vegetables. Vegetables in a cheese sauce. Fruit (4 to 5 servings daily)  Eat More Often: All fresh, canned (in natural juice), or frozen fruits. Dried fruits without added sugar. One hundred percent fruit juice ( cup [237 mL] daily).  Eat Less Often: Dried fruits with added sugar. Canned fruit in light or heavy syrup. [employer], Fish, and Poultry (2 servings or less daily. One serving is 3 to 4 oz [85-114 g]).  Eat More Often: Ninety percent or leaner ground beef, tenderloin, sirloin. Round cuts of beef, chicken breast, [place] breast. All fish. Grill, bake, or broil your meat. Nothing should be fried.  Eat Less Often/Avoid: Fatty cuts of meat, [place], or chicken leg, thigh, or wing. Fried cuts of meat or fish. Dairy (2 to 3 servings)  Eat More Often: Low-fat or fat-free milk, low-fat plain or light yogurt, reduced-fat or part-skim cheese.  Eat Less Often/Avoid: Milk (whole, 2%).Whole milk yogurt. Full-fat cheeses. Nuts, Seeds, and Legumes (4 to 5 servings per week)  Eat More Often: All without added salt.  Eat Less Often/Avoid: Salted nuts and seeds, canned beans with added salt. Fats and Sweets (limited)  Eat More Often: Vegetable oils, tub margarines without trans fats, sugar-free gelatin. Mayonnaise and salad dressings.  Eat Less Often/Avoid: Coconut oils, palm oils, butter, stick margarine, cream, half and half, cookies, candy, pie. FOR MORE INFORMATION The Dash Diet Eating Plan: www.dashdiet.org Document Released: 07/09/2011 Document Revised: 10/12/2011 Document Reviewed: 07/09/2011 [facility] Patient Information 2014 [place], [place].

## 2013-06-14 NOTE — Progress Notes (Signed)
  Subjective:    Patient ID: Julia [name], female    DOB: [birth year], 57 y.o.   MRN: [id number]  HPIHere for a med check.  #1 patient wants to quit smoking we talked about strategies to do so Chantix will be used but she was warned that if any depression symptoms she is to stop it immediately #2 history of hyperglycemia she is actually a pre-diabetic she needs to watch diet closely she needs to exercise try to bring her weight down keep A1c looking good ought to read look at this again in 6 months time #3 HTN decent control continue current measures don't change medicine. Continue with this does need blood work #4 hyperlipidemia she does need a repeat of her blood work to see where this is going she was encouraged to eat healthy in stay physically active #5 has a history of fatty liver we need to check liver enzymes see where that is going. Having left ear pain, cough, sore throat, sinus drainage x 1 week. All this been going on for the past week. PMH benign family history benign  Review of Systems She does relate low but a headache some congestion some coughing denies chest tightness pressure pain shortness breath sweats chills wheezing    Objective:   Physical Exam Mild sinus tenderness neck is supple lungs are clear hearts regular pulse normal extremities no edema skin warm dry blood pressure recheck good       Assessment & Plan:  #1 quit smoking-Chantix prescribed warned her that if any depression symptoms stop immediately #2 hyperglycemia-prediabetes proper diet exercise recheck A1c in 6 months #3 HTN good control continue current medicine check blood work #4 hyperlipidemia check lab work watch diet exercise #5 fatty liver try to lose weight check and liver enzymes await results #6 acute sinusitis doxycycline 10 days #7 followup 6 months

## 2013-06-20 ENCOUNTER — Ambulatory Visit: Payer: Self-pay | Admitting: Family Medicine

## 2013-07-05 ENCOUNTER — Other Ambulatory Visit: Payer: Self-pay | Admitting: Family Medicine

## 2013-08-09 ENCOUNTER — Other Ambulatory Visit: Payer: Self-pay | Admitting: Family Medicine

## 2013-09-12 ENCOUNTER — Telehealth: Payer: Self-pay | Admitting: Family Medicine

## 2013-09-12 ENCOUNTER — Encounter (HOSPITAL_COMMUNITY): Payer: Self-pay | Admitting: Emergency Medicine

## 2013-09-12 ENCOUNTER — Inpatient Hospital Stay (HOSPITAL_COMMUNITY)
Admission: RE | Admit: 2013-09-12 | Discharge: 2013-09-22 | DRG: 897 | Disposition: A | Discharge status: Other Acute Inpatient Hospital | Payer: 59 | Attending: Psychiatry | Admitting: Psychiatry

## 2013-09-12 ENCOUNTER — Encounter (HOSPITAL_COMMUNITY): Payer: Self-pay | Admitting: *Deleted

## 2013-09-12 ENCOUNTER — Emergency Department (HOSPITAL_COMMUNITY)
Admission: EM | Admit: 2013-09-12 | Discharge: 2013-09-12 | Disposition: A | Discharge status: Other Acute Inpatient Hospital | Payer: Managed Care, Other (non HMO) | Attending: Emergency Medicine | Admitting: Emergency Medicine

## 2013-09-12 ENCOUNTER — Emergency Department (HOSPITAL_COMMUNITY): Payer: Managed Care, Other (non HMO)

## 2013-09-12 DIAGNOSIS — F1994 Other psychoactive substance use, unspecified with psychoactive substance-induced mood disorder: Secondary | ICD-10-CM | POA: Diagnosis present

## 2013-09-12 DIAGNOSIS — F1020 Alcohol dependence, uncomplicated: Principal | ICD-10-CM | POA: Diagnosis present

## 2013-09-12 DIAGNOSIS — K219 Gastro-esophageal reflux disease without esophagitis: Secondary | ICD-10-CM | POA: Diagnosis present

## 2013-09-12 DIAGNOSIS — F32A Depression, unspecified: Secondary | ICD-10-CM

## 2013-09-12 DIAGNOSIS — R443 Hallucinations, unspecified: Secondary | ICD-10-CM | POA: Insufficient documentation

## 2013-09-12 DIAGNOSIS — R4789 Other speech disturbances: Secondary | ICD-10-CM | POA: Insufficient documentation

## 2013-09-12 DIAGNOSIS — E669 Obesity, unspecified: Secondary | ICD-10-CM | POA: Insufficient documentation

## 2013-09-12 DIAGNOSIS — F329 Major depressive disorder, single episode, unspecified: Secondary | ICD-10-CM | POA: Insufficient documentation

## 2013-09-12 DIAGNOSIS — F1010 Alcohol abuse, uncomplicated: Secondary | ICD-10-CM

## 2013-09-12 DIAGNOSIS — F333 Major depressive disorder, recurrent, severe with psychotic symptoms: Secondary | ICD-10-CM | POA: Diagnosis present

## 2013-09-12 DIAGNOSIS — F411 Generalized anxiety disorder: Secondary | ICD-10-CM | POA: Insufficient documentation

## 2013-09-12 DIAGNOSIS — I10 Essential (primary) hypertension: Secondary | ICD-10-CM | POA: Insufficient documentation

## 2013-09-12 DIAGNOSIS — F4310 Post-traumatic stress disorder, unspecified: Secondary | ICD-10-CM | POA: Diagnosis present

## 2013-09-12 DIAGNOSIS — R440 Auditory hallucinations: Secondary | ICD-10-CM

## 2013-09-12 DIAGNOSIS — R51 Headache: Secondary | ICD-10-CM | POA: Insufficient documentation

## 2013-09-12 DIAGNOSIS — F323 Major depressive disorder, single episode, severe with psychotic features: Secondary | ICD-10-CM | POA: Diagnosis not present

## 2013-09-12 DIAGNOSIS — F17200 Nicotine dependence, unspecified, uncomplicated: Secondary | ICD-10-CM | POA: Insufficient documentation

## 2013-09-12 DIAGNOSIS — G039 Meningitis, unspecified: Secondary | ICD-10-CM | POA: Insufficient documentation

## 2013-09-12 LAB — ETHANOL: Alcohol, Ethyl (B): 16 mg/dL — ABNORMAL HIGH (ref 0–11)

## 2013-09-12 LAB — RAPID URINE DRUG SCREEN, HOSP PERFORMED
Amphetamines: NOT DETECTED
Barbiturates: NOT DETECTED
Benzodiazepines: NOT DETECTED
Cocaine: NOT DETECTED
Opiates: NOT DETECTED
TETRAHYDROCANNABINOL: NOT DETECTED

## 2013-09-12 LAB — COMPREHENSIVE METABOLIC PANEL
ALT: 101 U/L — ABNORMAL HIGH (ref 0–35)
AST: 90 U/L — ABNORMAL HIGH (ref 0–37)
Albumin: 3.6 g/dL (ref 3.5–5.2)
Alkaline Phosphatase: 91 U/L (ref 39–117)
BUN: 7 mg/dL (ref 6–23)
CHLORIDE: 101 meq/L (ref 96–112)
CO2: 26 meq/L (ref 19–32)
CREATININE: 0.71 mg/dL (ref 0.50–1.10)
Calcium: 9 mg/dL (ref 8.4–10.5)
GFR calc Af Amer: >90 mL/min (ref >90)
Glucose, Bld: 100 mg/dL — ABNORMAL HIGH (ref 70–99)
Potassium: 3.3 mEq/L — ABNORMAL LOW (ref 3.7–5.3)
Sodium: 141 mEq/L (ref 137–147)
Total Protein: 7.4 g/dL (ref 6.0–8.3)

## 2013-09-12 LAB — URINALYSIS, ROUTINE W REFLEX MICROSCOPIC
Bilirubin Urine: NEGATIVE
Glucose, UA: NEGATIVE mg/dL
Hgb urine dipstick: NEGATIVE
Ketones, ur: NEGATIVE mg/dL
LEUKOCYTES UA: NEGATIVE
NITRITE: NEGATIVE
PH: 5.5 (ref 5.0–8.0)
Protein, ur: NEGATIVE mg/dL
Specific Gravity, Urine: 1.013 (ref 1.005–1.030)
Urobilinogen, UA: 0.2 mg/dL (ref 0.0–1.0)

## 2013-09-12 LAB — POCT PREGNANCY, URINE: PREG TEST UR: NEGATIVE

## 2013-09-12 LAB — CBC
HCT: 42.7 % (ref 36.0–46.0)
HEMOGLOBIN: 14.9 g/dL (ref 12.0–15.0)
MCH: 30.5 pg (ref 26.0–34.0)
MCHC: 34.9 g/dL (ref 30.0–36.0)
MCV: 87.3 fL (ref 78.0–100.0)
PLATELETS: 237 10*3/uL (ref 150–400)
RBC: 4.89 MIL/uL (ref 3.87–5.11)
RDW: 13.1 % (ref 11.5–15.5)
WBC: 6.6 10*3/uL (ref 4.0–10.5)

## 2013-09-12 LAB — SALICYLATE LEVEL

## 2013-09-12 LAB — ACETAMINOPHEN LEVEL: Acetaminophen (Tylenol), Serum: <15 ug/mL (ref 10–30)

## 2013-09-12 MED ORDER — LORAZEPAM 1 MG PO TABS: 1.0000 mg | ORAL_TABLET | Freq: Three times a day (TID) | ORAL | Status: DC | PRN

## 2013-09-12 MED ORDER — LORAZEPAM 1 MG PO TABS: 0.0000 mg | ORAL_TABLET | Freq: Two times a day (BID) | ORAL | Status: DC

## 2013-09-12 MED ORDER — THIAMINE HCL 100 MG/ML IJ SOLN: 100.0000 mg | Freq: Every day | INTRAMUSCULAR | Status: DC

## 2013-09-12 MED ORDER — VITAMIN B-1 100 MG PO TABS
100.0000 mg | ORAL_TABLET | Freq: Every day | ORAL | Status: DC
  Administered 2013-09-12: 100 mg via ORAL
  Filled 2013-09-12: qty 1

## 2013-09-12 MED ORDER — LORAZEPAM 1 MG PO TABS
0.0000 mg | ORAL_TABLET | Freq: Four times a day (QID) | ORAL | Status: DC
  Administered 2013-09-12: 1 mg via ORAL
  Filled 2013-09-12: qty 1

## 2013-09-12 NOTE — Tx Team (Signed)
Initial Interdisciplinary Treatment Plan  PATIENT STRENGTHS: (choose at least two) Ability for insight Active sense of humor Average or above average intelligence Capable of independent living Communication skills General fund of knowledge Physical Health Religious Affiliation Special hobby/interest Supportive family/friends Work skills  PATIENT STRESSORS: Financial difficulties Marital or family conflict Occupational concerns Substance abuse   PROBLEM LIST: Problem List/Patient Goals Date to be addressed Date deferred Reason deferred Estimated date of resolution  "How to express myself without being angry" 09/12/13     "I'd like for them to get my medications straightened out" 09/12/13     "Depression has gotten worse" 09/12/13           depression 09/12/13     Etoh abuse 09/12/13     Increased risk for suicide 09/12/13                  DISCHARGE CRITERIA:  Ability to meet basic life and health needs Adequate post-discharge living arrangements Improved stabilization in mood, thinking, and/or behavior Medical problems require only outpatient monitoring Motivation to continue treatment in a less acute level of care Need for constant or close observation no longer present Reduction of life-threatening or endangering symptoms to within safe limits Safe-care adequate arrangements made Verbal commitment to aftercare and medication compliance Withdrawal symptoms are absent or subacute and managed without 24-hour nursing intervention  PRELIMINARY DISCHARGE PLAN: Attend 12-step recovery group Outpatient therapy Participate in family therapy Return to previous living arrangement  PATIENT/FAMIILY INVOLVEMENT: This treatment plan has been presented to and reviewed with the patient, Julia [name], and/or family member.  The patient and family have been given the opportunity to ask questions and make suggestions.  Julia [name] [facility] 09/12/2013, 9:19 PM

## 2013-09-12 NOTE — ED Notes (Signed)
Pts husband states daughter has found empty etoh containers in home this afternoon.  Pt in denial of etoh use.  Informed of transfer to [facility].
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
## 2013-09-12 NOTE — ED Notes (Signed)
Pt. Family took belongings with them.

## 2013-09-12 NOTE — Telephone Encounter (Signed)
Patients husband called to let Dr Nicki Reaper know that patient has been admitted to behavioral health. He would like Dr Nicki Reaper to call him back.
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
## 2013-09-12 NOTE — BH Assessment (Signed)
Pt accepted to [facility] by Elvera Bicker. Pt support paperwork completed to include admission checklist, ROI, and voluntary consent form. TTS writer tried to contact EDP Dr.Ward to notify her and was unable to reach her. Pt assigned to bed 305-1 assigned to the care of Dr. Carlton Adam.  Shaune Pollack, MS, [place] Assessment Counselor

## 2013-09-12 NOTE — Progress Notes (Signed)
Writer informed the nurse that the patient cannot be ran at [facility] until she is medically cleared.  Writer informed the [facility]
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
## 2013-09-12 NOTE — ED Notes (Signed)
Patient discharge via ambulatory with no acute distress noted.

## 2013-09-13 ENCOUNTER — Encounter (HOSPITAL_COMMUNITY): Payer: Self-pay | Admitting: Psychiatry

## 2013-09-13 DIAGNOSIS — F1994 Other psychoactive substance use, unspecified with psychoactive substance-induced mood disorder: Secondary | ICD-10-CM | POA: Diagnosis present

## 2013-09-13 DIAGNOSIS — F4310 Post-traumatic stress disorder, unspecified: Secondary | ICD-10-CM | POA: Diagnosis present

## 2013-09-13 DIAGNOSIS — F1020 Alcohol dependence, uncomplicated: Secondary | ICD-10-CM | POA: Diagnosis present

## 2013-09-13 DIAGNOSIS — F333 Major depressive disorder, recurrent, severe with psychotic symptoms: Secondary | ICD-10-CM | POA: Diagnosis present

## 2013-09-13 DIAGNOSIS — F323 Major depressive disorder, single episode, severe with psychotic features: Secondary | ICD-10-CM | POA: Diagnosis not present

## 2013-09-13 MED ORDER — THIAMINE HCL 100 MG/ML IJ SOLN: 100.0000 mg | Freq: Once | INTRAMUSCULAR | Status: DC

## 2013-09-13 MED ORDER — HYDROXYZINE HCL 25 MG PO TABS: 25.0000 mg | ORAL_TABLET | Freq: Four times a day (QID) | ORAL | Status: AC | PRN

## 2013-09-13 MED ORDER — IBUPROFEN 200 MG PO TABS
400.0000 mg | ORAL_TABLET | Freq: Four times a day (QID) | ORAL | Status: DC | PRN
  Administered 2013-09-14 – 2013-09-21 (×6): 400 mg via ORAL
  Filled 2013-09-13 (×6): qty 2

## 2013-09-13 MED ORDER — LISINOPRIL 20 MG PO TABS
20.0000 mg | ORAL_TABLET | Freq: Every day | ORAL | Status: DC
  Administered 2013-09-13 – 2013-09-22 (×10): 20 mg via ORAL
  Filled 2013-09-13 (×12): qty 1

## 2013-09-13 MED ORDER — TRAZODONE HCL 50 MG PO TABS
50.0000 mg | ORAL_TABLET | Freq: Every evening | ORAL | Status: DC | PRN
  Administered 2013-09-13 – 2013-09-18 (×8): 50 mg via ORAL
  Filled 2013-09-13 (×17): qty 1

## 2013-09-13 MED ORDER — LISINOPRIL-HYDROCHLOROTHIAZIDE 20-25 MG PO TABS: 1.0000 | ORAL_TABLET | Freq: Every day | ORAL | Status: DC

## 2013-09-13 MED ORDER — CHLORDIAZEPOXIDE HCL 25 MG PO CAPS
25.0000 mg | ORAL_CAPSULE | Freq: Three times a day (TID) | ORAL | Status: AC
  Administered 2013-09-14 (×2): 25 mg via ORAL
  Filled 2013-09-13 (×3): qty 1

## 2013-09-13 MED ORDER — LOPERAMIDE HCL 2 MG PO CAPS: 2.0000 mg | ORAL_CAPSULE | ORAL | Status: AC | PRN

## 2013-09-13 MED ORDER — POTASSIUM CHLORIDE CRYS ER 20 MEQ PO TBCR
20.0000 meq | EXTENDED_RELEASE_TABLET | Freq: Two times a day (BID) | ORAL | Status: AC
  Administered 2013-09-13 – 2013-09-14 (×4): 20 meq via ORAL
  Filled 2013-09-13 (×4): qty 1

## 2013-09-13 MED ORDER — GUAIFENESIN ER 600 MG PO TB12
ORAL_TABLET | ORAL | Status: AC
  Administered 2013-09-13: 16:00:00
  Filled 2013-09-13: qty 1

## 2013-09-13 MED ORDER — FLUTICASONE PROPIONATE 50 MCG/ACT NA SUSP
2.0000 | Freq: Every day | NASAL | Status: DC
  Administered 2013-09-13 – 2013-09-22 (×10): 2 via NASAL
  Filled 2013-09-13: qty 16

## 2013-09-13 MED ORDER — TRIAMCINOLONE ACETONIDE 55 MCG/ACT NA AERO
1.0000 | INHALATION_SPRAY | Freq: Every day | NASAL | Status: DC
  Filled 2013-09-13: qty 16.5

## 2013-09-13 MED ORDER — HYDROCHLOROTHIAZIDE 25 MG PO TABS
25.0000 mg | ORAL_TABLET | Freq: Every day | ORAL | Status: DC
  Administered 2013-09-13 – 2013-09-22 (×10): 25 mg via ORAL
  Filled 2013-09-13 (×12): qty 1

## 2013-09-13 MED ORDER — VARENICLINE TARTRATE 1 MG PO TABS
1.0000 mg | ORAL_TABLET | Freq: Two times a day (BID) | ORAL | Status: DC
  Filled 2013-09-13 (×5): qty 1

## 2013-09-13 MED ORDER — CHLORDIAZEPOXIDE HCL 25 MG PO CAPS
25.0000 mg | ORAL_CAPSULE | Freq: Four times a day (QID) | ORAL | Status: AC
  Administered 2013-09-13 (×4): 25 mg via ORAL
  Filled 2013-09-13 (×4): qty 1

## 2013-09-13 MED ORDER — POTASSIUM CHLORIDE ER 10 MEQ PO TBCR
10.0000 meq | EXTENDED_RELEASE_TABLET | Freq: Every day | ORAL | Status: DC
  Administered 2013-09-13: 10 meq via ORAL
  Filled 2013-09-13 (×3): qty 1

## 2013-09-13 MED ORDER — MAGNESIUM HYDROXIDE 400 MG/5ML PO SUSP
30.0000 mL | Freq: Every day | ORAL | Status: DC | PRN
  Administered 2013-09-18: 30 mL via ORAL

## 2013-09-13 MED ORDER — GUAIFENESIN ER 600 MG PO TB12
600.0000 mg | ORAL_TABLET | Freq: Two times a day (BID) | ORAL | Status: AC
  Administered 2013-09-14 – 2013-09-20 (×6): 600 mg via ORAL
  Filled 2013-09-13 (×14): qty 1

## 2013-09-13 MED ORDER — CITALOPRAM HYDROBROMIDE 40 MG PO TABS
40.0000 mg | ORAL_TABLET | Freq: Every day | ORAL | Status: DC
  Administered 2013-09-13 – 2013-09-22 (×10): 40 mg via ORAL
  Filled 2013-09-13 (×12): qty 1

## 2013-09-13 MED ORDER — CHLORDIAZEPOXIDE HCL 25 MG PO CAPS
25.0000 mg | ORAL_CAPSULE | Freq: Every day | ORAL | Status: AC
  Administered 2013-09-16: 25 mg via ORAL
  Filled 2013-09-13: qty 1

## 2013-09-13 MED ORDER — PANTOPRAZOLE SODIUM 40 MG PO TBEC
40.0000 mg | DELAYED_RELEASE_TABLET | Freq: Every day | ORAL | Status: DC
  Administered 2013-09-13 – 2013-09-22 (×10): 40 mg via ORAL
  Filled 2013-09-13 (×12): qty 1

## 2013-09-13 MED ORDER — CYCLOSPORINE 0.05 % OP EMUL
1.0000 [drp] | Freq: Two times a day (BID) | OPHTHALMIC | Status: DC
  Administered 2013-09-13 – 2013-09-20 (×16): 1 [drp] via OPHTHALMIC
  Administered 2013-09-21: 17:00:00 via OPHTHALMIC
  Administered 2013-09-21 – 2013-09-22 (×2): 1 [drp] via OPHTHALMIC
  Filled 2013-09-13 (×23): qty 1

## 2013-09-13 MED ORDER — ADULT MULTIVITAMIN W/MINERALS CH
1.0000 | ORAL_TABLET | Freq: Every day | ORAL | Status: DC
  Administered 2013-09-13 – 2013-09-22 (×10): 1 via ORAL
  Filled 2013-09-13 (×12): qty 1

## 2013-09-13 MED ORDER — ALUM & MAG HYDROXIDE-SIMETH 200-200-20 MG/5ML PO SUSP: 30.0000 mL | ORAL | Status: DC | PRN

## 2013-09-13 MED ORDER — CHLORDIAZEPOXIDE HCL 25 MG PO CAPS
25.0000 mg | ORAL_CAPSULE | Freq: Four times a day (QID) | ORAL | Status: AC | PRN
  Filled 2013-09-13: qty 1

## 2013-09-13 MED ORDER — VITAMIN B-1 100 MG PO TABS
100.0000 mg | ORAL_TABLET | Freq: Every day | ORAL | Status: DC
  Administered 2013-09-14 – 2013-09-22 (×9): 100 mg via ORAL
  Filled 2013-09-13 (×11): qty 1

## 2013-09-13 MED ORDER — NICOTINE POLACRILEX 2 MG MT GUM
2.0000 mg | CHEWING_GUM | OROMUCOSAL | Status: DC | PRN
  Administered 2013-09-13 – 2013-09-22 (×29): 2 mg via ORAL
  Filled 2013-09-13 (×23): qty 1

## 2013-09-13 MED ORDER — CHLORDIAZEPOXIDE HCL 25 MG PO CAPS
25.0000 mg | ORAL_CAPSULE | ORAL | Status: AC
  Administered 2013-09-15 (×2): 25 mg via ORAL
  Filled 2013-09-13 (×2): qty 1

## 2013-09-13 NOTE — [facility] Group Notes (Signed)
[facility] LCSW Aftercare Discharge Planning Group Note   09/13/2013 10:55 AM  Participation Quality:  DID NOT ATTEND-pt refused to attend/stayed in room.   [employer]

## 2013-09-13 NOTE — Telephone Encounter (Signed)
I spoke with his spouse regarding caffeine. Currently she is undergoing treatment. Certainly anything we can do would be able to do here if they ask. I did encourage the husband that the patient does need to follow through with counseling as well as seeing psychiatry on a regular basis. He voiced understanding.

## 2013-09-13 NOTE — Progress Notes (Signed)
Pt c/o cough on and off times two weeks. She stated it is clear and she has a lot of head congestion. Lung sounds clear accept in right lower lobe rhonchi present. NP made aware and pt was started on mucinex . Given a 600mg  of mucinex at 3:40pm.pt does tend to isolate to her room . She denies Si and Hi and does contract for safety. Pt did state her husband was suppose to bring her some clothes.
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
## 2013-09-13 NOTE — Progress Notes (Signed)
Psychoeducational Group Note  Date:  09/13/2013 Time:  2000  Group Topic/Focus:  NA group  Participation Level: Did Not Attend  Participation Quality:  Not Applicable  Affect:  Not Applicable  Cognitive:  Not Applicable  Insight:  Not Applicable  Engagement in Group: Not Applicable  Additional Comments:    Sharmon Revere 09/13/2013, 10:46 PM
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
## 2013-09-13 NOTE — Progress Notes (Signed)
Patient ID: Julia [name], female   DOB: 06/07/1956, 58 y.o.   MRN: [id number]   D: Pt was laying in bed during the assessment. Writer asked pt if she planned to attend the group. Pt stated, "I don't feel like going". Writer acknowledged, and asked pt if she was able to attend any groups today. Pt instantly stated, "no". When asked if she plans to attend any groups pt stated, "No". Writer attempted to encourage pt to attend groups, and get as much out of her treatment as possible. Pt stated she wants her meds to be adj.  A:  Support and encouragement was offered. 15 min checks continued for safety.  R: Pt remains safe.

## 2013-09-13 NOTE — Progress Notes (Signed)
Pt has been in bed most of the day.  She rated all her depression, hopelessness and anxiety a 7 on her self-inventory.  She denies any S/H ideations or A/V/H.  She did take a shower this afternoon and was able to go down for lunch.  She stated,"I will start going to groups tomorrow.  I am really tired"
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
## 2013-09-13 NOTE — BHH Group Notes (Signed)
[facility] Group Therapy  09/13/2013 3:43 PM  Type of Therapy:  Group Therapy  Participation Level:  Did Not Attend-pt in room resting/completing PSA with intern during group time.   Smart, Anjolaoluwa Siguenza LCSWA  09/13/2013, 3:43 PM

## 2013-09-14 MED ORDER — POTASSIUM CHLORIDE ER 10 MEQ PO TBCR
10.0000 meq | EXTENDED_RELEASE_TABLET | Freq: Every day | ORAL | Status: DC
  Administered 2013-09-18 – 2013-09-22 (×5): 10 meq via ORAL
  Filled 2013-09-14 (×7): qty 1

## 2013-09-14 MED ORDER — RISPERIDONE 0.25 MG PO TABS
0.2500 mg | ORAL_TABLET | Freq: Two times a day (BID) | ORAL | Status: DC
  Administered 2013-09-14 – 2013-09-15 (×2): 0.25 mg via ORAL
  Filled 2013-09-14 (×7): qty 1

## 2013-09-14 MED ORDER — GUAIFENESIN-DM 100-10 MG/5ML PO SYRP
5.0000 mL | ORAL_SOLUTION | ORAL | Status: DC | PRN
  Administered 2013-09-14 (×2): 5 mL via ORAL

## 2013-09-14 MED ORDER — PRAZOSIN HCL 1 MG PO CAPS
1.0000 mg | ORAL_CAPSULE | Freq: Every day | ORAL | Status: DC
  Administered 2013-09-14 – 2013-09-17 (×4): 1 mg via ORAL
  Filled 2013-09-14 (×7): qty 1

## 2013-09-14 NOTE — BHH Group Notes (Signed)
[facility]  09/14/2013 3:21 PM  Type of Therapy:  Group Therapy  Participation Level:  Did Not Attend-pt sleeping in room/refused to attend group when asked by tech.   Smart, Charleston Vierling LCSWA  09/14/2013, 3:21 PM

## 2013-09-14 NOTE — Progress Notes (Signed)
Pt remained in bed this morning until after lunch.  Held her noon dose of librium after speaking with Dr. Sabra Heck.  She denies any anxiety her depression a 8 and hopelessness a 10 on her self-inventory.  She denied any A/V/H or S/H ideation.
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
## 2013-09-14 NOTE — Progress Notes (Signed)
D Pt. Denies SI and HI at this time.  Has complained of a headache and a cough this pm.  A Writer offered support and encouragement. Discussed coping skills with pt.  R Pt. Remains safe on the unit.  Received cough medication and ibuprofen which relieved the headache.  Pt. Has been interacting with her peers in the dayroom this pm and did attend wrap up group.

## 2013-09-14 NOTE — Progress Notes (Signed)
Recreation Therapy Notes  Animal-Assisted Activity/Therapy (AAA/T) Program Checklist/Progress Notes Patient Eligibility Criteria Checklist & Daily Group note for Rec Tx Intervention  Date: 02.12.2015 Time: 2:45pm Location: [address] Dayroom    AAA/T Program Assumption of Risk Form signed by Patient/ or Parent Legal Guardian yes  Patient is free of allergies or sever asthma yes  Patient reports no fear of animals yes  Patient reports no history of cruelty to animals yes   Patient understands his/her participation is voluntary yes  Behavioral Response: Did not attend.   Giovannina Mun L Pat Sires, LRT/CTRS  Lavonda Thal L 09/14/2013 5:31 PM 

## 2013-09-14 NOTE — Progress Notes (Signed)
Recreation Therapy Notes  Date: 02.11.2015 Time: 2:45pm Location: [address] Dayroom   Group Topic: Anger Management  Goal Area(s) Addresses:  Patient will identify body's physical reaction to anger.  Patient will identify positive coping mechanisms to deal with anger.  Patient will select one coping mechanism of choice to use post d/c when experiencing anger.   Behavioral Response: Did not attend.    Laureen Ochs Gianfranco Araki, LRT/CTRS   Firas Guardado L 09/14/2013 9:15 AM

## 2013-09-14 NOTE — Progress Notes (Signed)
The focus of this group is to educate the patient on the purpose and policies of crisis stabilization and provide a format to answer questions about their admission.  The group details unit policies and expectations of patients while admitted.  Pt did not attend the 0900 nursing group. 

## 2013-09-15 DIAGNOSIS — F4310 Post-traumatic stress disorder, unspecified: Secondary | ICD-10-CM | POA: Diagnosis present

## 2013-09-15 MED ORDER — GUAIFENESIN-DM 100-10 MG/5ML PO SYRP
15.0000 mL | ORAL_SOLUTION | ORAL | Status: DC | PRN
  Administered 2013-09-15 – 2013-09-22 (×20): 15 mL via ORAL
  Filled 2013-09-15: qty 15

## 2013-09-15 MED ORDER — RISPERIDONE 0.5 MG PO TABS
0.5000 mg | ORAL_TABLET | Freq: Two times a day (BID) | ORAL | Status: DC
  Administered 2013-09-15 – 2013-09-19 (×8): 0.5 mg via ORAL
  Filled 2013-09-15 (×10): qty 1

## 2013-09-15 NOTE — Progress Notes (Signed)
D: Patient denies SI/HI and A/V hallucinations; patient declined self inventory; patient reports that she is very fatigued today   A: Monitored q 15 minutes; patient encouraged to attend groups; patient educated about medications; patient given medications per physician orders; patient encouraged to express feelings and/or concerns  R: Patient has been in the bed all day; patient's interaction with staff and peers is appropriate; patient was able to set goal to talk with staff 1:1 when having feelings of SI; patient is taking medications as prescribed and tolerating medications; patient has not attended any groups today

## 2013-09-15 NOTE — Progress Notes (Signed)
Patient ID: Julia [name], female   DOB: 09/12/1955, 58 y.o.   MRN: [id number]  Pt asleep; no s/s of distress noted at this time. Respirations regular and unlabored.

## 2013-09-15 NOTE — Progress Notes (Signed)
Pt attended AA meeting.  

## 2013-09-15 NOTE — Progress Notes (Signed)
[facility] MD Progress Note  09/15/2013 4:49 PM Julia [name]  MRN:  [id number] Subjective:  Julia [name] endorses that she is still hearing the voices. Last night in group she heard "we cant trust them." She states she does not think she would hurt herself or her husband. (she has had previous suicidal attempts) She admits to episodes of  losing time "days" Some amnestic behaviors in the past have been adjudicated to alcoholic black outs.  Diagnosis:   DSM5: Schizophrenia Disorders:  none Obsessive-Compulsive Disorders:  none Trauma-Stressor Disorders:  Posttraumatic Stress Disorder (309.81) Substance/Addictive Disorders:  Alcohol Related Disorder - Severe (303.90) Depressive Disorders:  Major Depressive Disorder - with Psychotic Features (296.24) Total Time spent with patient: 30 minutes  Axis I: Substance Induced Mood Disorder and R/O Dissociative Disorder  ADL's:  Intact  Sleep: Poor  Appetite:  Fair  Suicidal Ideation:  Plan:  denies Intent:  denies Means:  denies Homicidal Ideation:  Plan:  denies Intent:  denies Means:  denies AEB (as evidenced by):  Psychiatric Specialty Exam: Physical Exam  Review of Systems  Constitutional: Positive for malaise/fatigue.  HENT: Negative.   Eyes: Negative.   Respiratory: Positive for cough.   Cardiovascular: Negative.   Gastrointestinal: Negative.   Genitourinary: Negative.   Musculoskeletal: Positive for myalgias.  Skin: Negative.   Neurological: Negative.   Endo/Heme/Allergies: Negative.   Psychiatric/Behavioral: Positive for depression and substance abuse. The patient is nervous/anxious.     Blood pressure 139/91, pulse 87, temperature 97.4 F (36.3 C), temperature source Oral, resp. rate 24, height 5' 2.5" (1.588 m), weight 86.637 kg (191 lb).Body mass index is 34.36 kg/(m^2).  General Appearance: Fairly Groomed  Engineer, water::  Fair  Speech:  Clear and Coherent, Slow and not spontaneous  Volume:  Decreased  Mood:  Anxious and  Depressed  Affect:  Restricted, Tearful and sad, perplexed  Thought Process:  Coherent and Goal Directed  Orientation:  Full (Time, Place, and Person)  Thought Content:  symptoms, worries, concerns  Suicidal Thoughts:  No  Homicidal Thoughts:  No  Memory:  Immediate;   Fair Recent;   Fair Remote;   Fair  Judgement:  Fair  Insight:  superficial  Psychomotor Activity:  Psychomotor Retardation  Concentration:  Fair  Recall:  Ty Ty  Language: Fair  Akathisia:  No  Handed:    AIMS (if indicated):     Assets:  Desire for Improvement Housing  Sleep:  Number of Hours: 6.5   Musculoskeletal: Strength & Muscle Tone: within normal limits Gait & Station: normal Patient leans: N/A  Current Medications: Current Facility-Administered Medications  Medication Dose Route Frequency Provider Last Rate Last Dose  . alum & mag hydroxide-simeth (MAALOX/MYLANTA) 200-200-20 MG/5ML suspension 30 mL  30 mL Oral Q4H PRN Laverle Hobby, PA-C      . chlordiazePOXIDE (LIBRIUM) capsule 25 mg  25 mg Oral Q6H PRN Laverle Hobby, PA-C      . chlordiazePOXIDE (LIBRIUM) capsule 25 mg  25 mg Oral BH-qamhs Spencer E Simon, PA-C   25 mg at 09/15/13 9147   Followed by  . [START ON 09/16/2013] chlordiazePOXIDE (LIBRIUM) capsule 25 mg  25 mg Oral Daily Laverle Hobby, PA-C      . citalopram (CELEXA) tablet 40 mg  40 mg Oral Daily Laverle Hobby, PA-C   40 mg at 09/15/13 0839  . cycloSPORINE (RESTASIS) 0.05 % ophthalmic emulsion 1 drop  1 drop Both Eyes BID Laverle Hobby, PA-C  1 drop at 09/15/13 1621  . fluticasone (FLONASE) 50 MCG/ACT nasal spray 2 spray  2 spray Each Nare Daily Rachael FeeIrving A Ashari Llewellyn, MD   2 spray at 09/15/13 [phone]  . guaiFENesin (MUCINEX) 12 hr tablet 600 mg  600 mg Oral BID Sanjuana KavaAgnes I Nwoko, NP   600 mg at 09/15/13 1621  . guaiFENesin-dextromethorphan (ROBITUSSIN DM) 100-10 MG/5ML syrup 15 mL  15 mL Oral Q4H PRN Rachael FeeIrving A Aftin Lye, MD   15 mL at 09/15/13 1624  . hydrochlorothiazide  (HYDRODIURIL) tablet 25 mg  25 mg Oral Daily Rachael FeeIrving A Onell Mcmath, MD   25 mg at 09/15/13 [phone]  . hydrOXYzine (ATARAX/VISTARIL) tablet 25 mg  25 mg Oral Q6H PRN Kerry HoughSpencer E Simon, PA-C      . ibuprofen (ADVIL,MOTRIN) tablet 400 mg  400 mg Oral Q6H PRN Kerry HoughSpencer E Simon, PA-C   400 mg at 09/14/13 1619  . lisinopril (PRINIVIL,ZESTRIL) tablet 20 mg  20 mg Oral Daily Rachael FeeIrving A Crystel Demarco, MD   20 mg at 09/15/13 [phone]  . loperamide (IMODIUM) capsule 2-4 mg  2-4 mg Oral PRN Kerry HoughSpencer E Simon, PA-C      . magnesium hydroxide (MILK OF MAGNESIA) suspension 30 mL  30 mL Oral Daily PRN Kerry HoughSpencer E Simon, PA-C      . multivitamin with minerals tablet 1 tablet  1 tablet Oral Daily Kerry HoughSpencer E Simon, PA-C   1 tablet at 09/15/13 [phone]  . nicotine polacrilex (NICORETTE) gum 2 mg  2 mg Oral PRN Sanjuana KavaAgnes I Nwoko, NP   2 mg at 09/15/13 1625  . pantoprazole (PROTONIX) EC tablet 40 mg  40 mg Oral Daily Kerry HoughSpencer E Simon, PA-C   40 mg at 09/15/13 [phone]  . [START ON 09/18/2013] potassium chloride (K-DUR) CR tablet 10 mEq  10 mEq Oral Daily Sanjuana KavaAgnes I Nwoko, NP      . prazosin (MINIPRESS) capsule 1 mg  1 mg Oral QHS Rachael FeeIrving A Leasha Goldberger, MD   1 mg at 09/14/13 2136  . risperiDONE (RISPERDAL) tablet 0.5 mg  0.5 mg Oral BID Rachael FeeIrving A Gal Feldhaus, MD   0.5 mg at 09/15/13 1621  . thiamine (VITAMIN B-1) tablet 100 mg  100 mg Oral Daily Kerry HoughSpencer E Simon, PA-C   100 mg at 09/15/13 [phone]  . traZODone (DESYREL) tablet 50 mg  50 mg Oral QHS,MR X 1 Kerry HoughSpencer E Simon, PA-C   50 mg at 09/14/13 2136    Lab Results: No results found for this or any previous visit (from the past 48 hour(s)).  Physical Findings: AIMS: Facial and Oral Movements Muscles of Facial Expression: None, normal Lips and Perioral Area: None, normal Jaw: None, normal Tongue: None, normal,Extremity Movements Upper (arms, wrists, hands, fingers): None, normal Lower (legs, knees, ankles, toes): None, normal, Trunk Movements Neck, shoulders, hips: None, normal, Overall Severity Severity of abnormal movements  (highest score from questions above): None, normal Incapacitation due to abnormal movements: None, normal Patient's awareness of abnormal movements (rate only patient's report): No Awareness, Dental Status Current problems with teeth and/or dentures?: No Does patient usually wear dentures?: No  CIWA:  CIWA-Ar Total: 0 COWS:     Treatment Plan Summary: Daily contact with patient to assess and evaluate symptoms and progress in treatment Medication management  Plan: Supportive approach/coping skills/relapse prevention           Reassess co morbidities           Further evaluate for dissociative experiences           Increase  Risperdal           Trial with Lamictal Medical Decision Making Problem Points:  New problem, with no additional work-up planned (3) and Review of psycho-social stressors (1) Data Points:  Review of medication regiment & side effects (2) Review of new medications or change in dosage (2)  I certify that inpatient services furnished can reasonably be expected to improve the patient's condition.   Ishaan Villamar A 09/15/2013, 4:49 PM

## 2013-09-15 NOTE — Progress Notes (Signed)
Adult Psychoeducational Group Note  Date:  09/15/2013 Time:  11:22 AM  Group Topic/Focus:  Building Self Esteem:   The Focus of this group is helping patients become aware of the effects of self-esteem on their lives, the things they and others do that enhance or undermine their self-esteem, seeing the relationship between their level of self-esteem and the choices they make and learning ways to enhance self-esteem. Self Esteem Action Plan:   The focus of this group is to help patients create a plan to continue to build self-esteem after discharge.  Participation Level:  Did not attend  Modes of Intervention:  Discussion, Socialization and Support  Additional Comments:  Pts discussed personal experiences of what depletes their self-esteem and what has increased self-esteem in their lives. Pt was in bed asleep.  Julia [name] 09/15/2013, 11:22 AM

## 2013-09-15 NOTE — BHH Group Notes (Signed)
[facility] LCSW Aftercare Discharge Planning Group Note   09/15/2013 9:58 AM  Participation Quality:  DID NOT ATTEND-pt in bed resting/refused to attend   [name], Julia Amel

## 2013-09-15 NOTE — BHH Group Notes (Signed)
[place] LCSW Group Therapy  09/15/2013 2:48 PM  Type of Therapy:  Group Therapy  Participation Level:  Did Not Attend-pt in bed resting/refusing to attend groups. At this time, unwilling to discuss aftercare planning.   [name], Julia Reeg LCSWA  09/15/2013, 2:48 PM

## 2013-09-15 NOTE — Progress Notes (Signed)
[place] Group Notes:  (Nursing/MHT/Case Management/Adjunct)  Date:  09/14/2013 Time:  2100  Type of Therapy:  wrap up group  Participation Level:  Minimal  Participation Quality:  Attentive and Resistant  Affect:  Depressed  Cognitive:  Appropriate  Insight:  Lacking  Engagement in Group:  Attentive   Modes of Intervention:  Clarification, Education and Support  Summary of Progress/Problems: Pt reports that she has been living in her own little box with everyone else on the outside. Pt wants to feel better physically, mentally, and emotionally.  Pt reports nothing good happened today but a medicinal change was positive.   Jacques Navy 09/15/2013, 2:37 AM

## 2013-09-15 NOTE — BHH Suicide Risk Assessment (Addendum)
[place] INPATIENT:  Family/Significant Other Suicide Prevention Education  Suicide Prevention Education:  Contact Attempts: Agata Lucente (pt's husband) [phone] has been identified by the patient as the family member/significant other with whom the patient will be residing, and identified as the person(s) who will aid the patient in the event of a mental health crisis.  With written consent from the patient, two attempts were made to provide suicide prevention education, prior to and/or following the patient's discharge.  We were unsuccessful in providing suicide prevention education.  A suicide education pamphlet was given to the patient to share with family/significant other. Pt denies HI and SI at this time.   Date and time of first attempt: 11:45AM 09/15/13 Date and time of second attempt: 2:45PM 09/15/13 (voicemail left for [facility], Alicia Amel  09/15/2013, 2:47 PM   CSW completed with pt's husband, Yanis Juma. Alvester Chou reports that he is noticing a small positive change in his wife concerning mood. She is no longer endorsing HI toward him. No guns in the home. Mr. Meara verbalized understanding of information concerning SPE that was reviewed with him.   [place], LCSWA  09/18/2013 11:13 AM

## 2013-09-16 DIAGNOSIS — F1994 Other psychoactive substance use, unspecified with psychoactive substance-induced mood disorder: Secondary | ICD-10-CM

## 2013-09-16 DIAGNOSIS — F4310 Post-traumatic stress disorder, unspecified: Secondary | ICD-10-CM

## 2013-09-16 DIAGNOSIS — F323 Major depressive disorder, single episode, severe with psychotic features: Secondary | ICD-10-CM

## 2013-09-16 DIAGNOSIS — F1020 Alcohol dependence, uncomplicated: Principal | ICD-10-CM

## 2013-09-16 MED ORDER — IPRATROPIUM-ALBUTEROL 0.5-2.5 (3) MG/3ML IN SOLN
3.0000 mL | Freq: Four times a day (QID) | RESPIRATORY_TRACT | Status: DC | PRN
  Filled 2013-09-16: qty 3

## 2013-09-16 NOTE — Progress Notes (Signed)
D: pt c/o of having a persistent cough. Writer made aware and gave pt some cough syrup. Pt issued some relief from cough. Pt stated that today has been okay. Not c/o of any withdrawal symptoms. Denies si/hi/avh. Denies pain. Pt attended [place] group. Pt is calm and cooperative, appropriate with contact A: q 15 min safety checks. Support and encouragement offered R: pt remains safe on unit. No further complaints or signs of distress at this time

## 2013-09-16 NOTE — BHH Group Notes (Signed)
[place] Group Notes:  (Clinical Social Work)  09/16/2013     10-11AM  Summary of Progress/Problems:   The main focus of today's process group was for the patient to identify ways in which they have in the past sabotaged their own recovery. Motivational Interviewing and a worksheet were utilized to help patients explore the perceived benefits and costs of their substance use, as well as the potential benefits and costs of stopping.  The Stages of Change were explained using a handout, and patients identified where they currently are with regard to stages of change.  The patient expressed that if the use of substances were stopped, she would expect to feel emotional pain.  She was late group, then left early.  Had a flat affect and spoke little except in side conversations that were distracting to group members.  Type of Therapy:  Group Therapy - Process   Participation Level:  Minimal  Participation Quality:  Inattentive  Affect:  Blunted and Not Congruent  Cognitive:  Oriented  Insight:  Limited  Engagement in Therapy:  Distracting and Limited  Modes of Intervention:  Education, Support and Processing, Motivational Interviewing  Selmer Dominion, LCSW 09/16/2013, 1:05 PM
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
## 2013-09-16 NOTE — Progress Notes (Signed)
[place] Group Notes:  (Nursing/MHT/Case Management/Adjunct)  Date:  09/16/2013  Time:  2100  Type of Therapy:  wrap up group  Participation Level:  Minimal  Participation Quality:  Appropriate and Attentive  Affect:  Depressed  Cognitive:  Appropriate  Insight:  Appropriate  Engagement in Group:  Limited and Resistant  Modes of Intervention:  Clarification, Education and Support  Summary of Progress/Problems: Pt reports having a good visit from her loving and supportive husband and daughter. Pt reports having a terrible cough and feels like she is disruptive and contagious when around peer patients. Pt plans on returning home and going to intensive outpatient one on one therapy.   Jacques Navy 09/16/2013, 11:07 PM

## 2013-09-16 NOTE — BHH Group Notes (Signed)
[facility] Group Notes:  (Nursing/MHT/Case Management/Adjunct)  Psychoeducational Group Note  Date:  09/16/2013 Time:  0900  Group Topic/Focus:  Healthy Communication:   The focus of this group is to discuss communication, barriers to communication, as well as healthy ways to communicate with others.  Participation Level: Did Not Attend  Participation Quality:  Not Applicable  Affect:  Not Applicable  Cognitive:  Not Applicable  Insight:  Not Applicable  Engagement in Group: Not Applicable  Additional Comments:    [name] 09/16/2013, 10:03 AM

## 2013-09-16 NOTE — Progress Notes (Signed)
Patient ID: Julia [name], female   DOB: 06/08/1956, 58 y.o.   MRN: [id number] [facility] MD Progress Note  09/16/2013 1:04 PM Julia [name]  MRN:  [id number] Subjective:   Patient states "I am feeling depressed. It's not helping that I have this terrible cough. I am hearing two voices. One of them is a man and the other is a little girl. We go to groups and will decide what is best for [place]. I use alcohol to stop my emotional pain. I have been hearing these voices for several years but they have been getting worse. This coughing is making it hard to sleep. I also feel like I have been wheezing some."   Objective:  Patient appears depressed during the assessment rating it at eight. She expresses hope that the Risperdal will help eliminate the voices that she has been dealing with for some time. Patient admits to hearing more than one voice. She reports the female voice is bossy trying to tell her what decisions to make. The patient can be heard coughing loudly on the unit. She reports being treated with Doxycycline several weeks ago for a sinus infection. Patient is complaint with her medications denying adverse effects. She is attending groups on the unit.   Diagnosis:   DSM5: Schizophrenia Disorders:  none Obsessive-Compulsive Disorders:  none Trauma-Stressor Disorders:  Posttraumatic Stress Disorder (309.81) Substance/Addictive Disorders:  Alcohol Related Disorder - Severe (303.90) Depressive Disorders:  Major Depressive Disorder - with Psychotic Features (296.24) Total Time spent with patient: 30 minutes  Axis I: Substance Induced Mood Disorder and R/O Dissociative Disorder  ADL's:  Intact  Sleep: Poor  Appetite:  Fair  Suicidal Ideation:  Plan:  denies Intent:  denies Means:  denies Homicidal Ideation:  Plan:  denies Intent:  denies Means:  denies AEB (as evidenced by):  Psychiatric Specialty Exam: Physical Exam  Review of Systems  Constitutional: Positive for malaise/fatigue.  HENT:  Negative.   Eyes: Negative.   Respiratory: Positive for cough, sputum production, shortness of breath and wheezing.   Cardiovascular: Negative.   Gastrointestinal: Negative.   Genitourinary: Negative.   Musculoskeletal: Positive for myalgias.  Skin: Negative.   Neurological: Negative.   Endo/Heme/Allergies: Negative.   Psychiatric/Behavioral: Positive for depression and substance abuse. The patient is nervous/anxious.     Blood pressure 106/76, pulse 85, temperature 97.3 F (36.3 C), temperature source Oral, resp. rate 20, height 5' 2.5" (1.588 m), weight 86.637 kg (191 lb).Body mass index is 34.36 kg/(m^2).  General Appearance: Fairly Groomed  Engineer, water::  Fair  Speech:  Clear and Coherent, Slow and not spontaneous  Volume:  Decreased  Mood:  Anxious and Depressed  Affect:  Flat, Tearful and sad, perplexed  Thought Process:  Coherent and Goal Directed  Orientation:  Full (Time, Place, and Person)  Thought Content:  Hallucinations: Auditory and Rumination  Suicidal Thoughts:  No  Homicidal Thoughts:  No  Memory:  Immediate;   Fair Recent;   Fair Remote;   Fair  Judgement:  Fair  Insight:  superficial  Psychomotor Activity:  Psychomotor Retardation  Concentration:  Fair  Recall:  [place]: Fair  Akathisia:  No  Handed:    AIMS (if indicated):     Assets:  Desire for Improvement Housing Social Support  Sleep:  Number of Hours: 5.25   Musculoskeletal: Strength & Muscle Tone: within normal limits Gait & Station: normal Patient leans: N/A  Current Medications: Current Facility-Administered Medications  Medication Dose  Route Frequency Provider Last Rate Last Dose  . alum & mag hydroxide-simeth (MAALOX/MYLANTA) 200-200-20 MG/5ML suspension 30 mL  30 mL Oral Q4H PRN Laverle Hobby, PA-C      . citalopram (CELEXA) tablet 40 mg  40 mg Oral Daily Laverle Hobby, PA-C   40 mg at 09/16/13 7106  . cycloSPORINE (RESTASIS) 0.05 % ophthalmic  emulsion 1 drop  1 drop Both Eyes BID Laverle Hobby, PA-C   1 drop at 09/16/13 0827  . fluticasone (FLONASE) 50 MCG/ACT nasal spray 2 spray  2 spray Each Nare Daily Nicholaus Bloom, MD   2 spray at 09/16/13 [phone]  . guaiFENesin (MUCINEX) 12 hr tablet 600 mg  600 mg Oral BID Encarnacion Slates, NP   600 mg at 09/16/13 8546  . guaiFENesin-dextromethorphan (ROBITUSSIN DM) 100-10 MG/5ML syrup 15 mL  15 mL Oral Q4H PRN Nicholaus Bloom, MD   15 mL at 09/16/13 0830  . hydrochlorothiazide (HYDRODIURIL) tablet 25 mg  25 mg Oral Daily Nicholaus Bloom, MD   25 mg at 09/16/13 2703  . ibuprofen (ADVIL,MOTRIN) tablet 400 mg  400 mg Oral Q6H PRN Laverle Hobby, PA-C   400 mg at 09/15/13 1707  . lisinopril (PRINIVIL,ZESTRIL) tablet 20 mg  20 mg Oral Daily Nicholaus Bloom, MD   20 mg at 09/16/13 5009  . magnesium hydroxide (MILK OF MAGNESIA) suspension 30 mL  30 mL Oral Daily PRN Laverle Hobby, PA-C      . multivitamin with minerals tablet 1 tablet  1 tablet Oral Daily Laverle Hobby, PA-C   1 tablet at 09/16/13 3818  . nicotine polacrilex (NICORETTE) gum 2 mg  2 mg Oral PRN Encarnacion Slates, NP   2 mg at 09/16/13 2993  . pantoprazole (PROTONIX) EC tablet 40 mg  40 mg Oral Daily Laverle Hobby, PA-C   40 mg at 09/16/13 0827  . [START ON 09/18/2013] potassium chloride (K-DUR) CR tablet 10 mEq  10 mEq Oral Daily Encarnacion Slates, NP      . prazosin (MINIPRESS) capsule 1 mg  1 mg Oral QHS Nicholaus Bloom, MD   1 mg at 09/15/13 2143  . risperiDONE (RISPERDAL) tablet 0.5 mg  0.5 mg Oral BID Nicholaus Bloom, MD   0.5 mg at 09/16/13 7169  . thiamine (VITAMIN B-1) tablet 100 mg  100 mg Oral Daily Laverle Hobby, PA-C   100 mg at 09/16/13 6789  . traZODone (DESYREL) tablet 50 mg  50 mg Oral QHS,MR X 1 Laverle Hobby, PA-C   50 mg at 09/15/13 2303    Lab Results: No results found for this or any previous visit (from the past 48 hour(s)).  Physical Findings: AIMS: Facial and Oral Movements Muscles of Facial Expression: None, normal Lips  and Perioral Area: None, normal Jaw: None, normal Tongue: None, normal,Extremity Movements Upper (arms, wrists, hands, fingers): None, normal Lower (legs, knees, ankles, toes): None, normal, Trunk Movements Neck, shoulders, hips: None, normal, Overall Severity Severity of abnormal movements (highest score from questions above): None, normal Incapacitation due to abnormal movements: None, normal Patient's awareness of abnormal movements (rate only patient's report): No Awareness, Dental Status Current problems with teeth and/or dentures?: No Does patient usually wear dentures?: No  CIWA:  CIWA-Ar Total: 0 COWS:     Treatment Plan Summary: Daily contact with patient to assess and evaluate symptoms and progress in treatment Medication management  Plan:  Continue crisis management and stabilization.  Medication management: Continue Risperdal 0.5 mg BID for psychosis, Celexa 40 mg daily for depressive symptoms, Minipress 1 mg at hs for PTSD symptoms.  Encouraged patient to attend groups and participate in group counseling sessions and activities.  Discharge plan in progress.  Continue current treatment plan.  Address health issues: Mucinex DM for congestion, Robitussin DM for cough, start albuterol nebulizer treatments for complaints of wheezing/shortness of breath  Medical Decision Making Problem Points:  Established problem, stable/improving (1), Review of last therapy session (1) and Review of psycho-social stressors (1) Data Points:  Review of medication regiment & side effects (2)  I certify that inpatient services furnished can reasonably be expected to improve the patient's condition.   Elmarie Shiley NP-C 09/16/2013, 1:04 PM  Patient seen, evaluated and I agree with notes by Nurse Practitioner. Corena Pilgrim, MD

## 2013-09-17 MED ORDER — IPRATROPIUM BROMIDE 0.02 % IN SOLN
0.5000 mg | Freq: Four times a day (QID) | RESPIRATORY_TRACT | Status: DC | PRN
  Administered 2013-09-17 – 2013-09-21 (×4): 0.5 mg via RESPIRATORY_TRACT
  Filled 2013-09-17: qty 2.5

## 2013-09-17 MED ORDER — ALBUTEROL SULFATE (2.5 MG/3ML) 0.083% IN NEBU
2.5000 mg | INHALATION_SOLUTION | Freq: Four times a day (QID) | RESPIRATORY_TRACT | Status: DC | PRN
  Administered 2013-09-17 – 2013-09-21 (×6): 2.5 mg via RESPIRATORY_TRACT
  Filled 2013-09-17 (×3): qty 3

## 2013-09-17 NOTE — Progress Notes (Signed)
D. Pt has been visible periodically during the day, has remained in bed for much of it however as pt has complained of congestion, has a cough and generally not feeling well. Pt has also endorsed depression and feelings of agitation but has denied any SI today. Pt spoke about wanting to get into long term treatment when she gets discharged from here and has received all medications without incident. A. Support and encouragement provided, medication education given. R. Pt verbalized understanding, safety maintained.

## 2013-09-17 NOTE — Progress Notes (Signed)
Patient ID: Julia [name], female   DOB: [birth year], 58 y.o.   MRN: [id number]   [facility] MD Progress Note  09/17/2013 3:03 PM MARESSA APOLLO  MRN:  [id number] Subjective:   Patient states "My cough is better since I had the breathing treatment. I think the voices are starting to get a little better. But they are still there. It's hard to tell because I have been feeling sick. I feel tired."    Objective:  Patient continues to report ongoing symptoms of depression, PTSD, and psychosis. She appears depressed throughout the assessment with very poor attention span. Patient feels her cough has been interrupting her sleep. Nursing staff report that the patient has minimal interaction with peers. Patient is compliant with her scheduled medications denying any adverse effects. Rates her depression at seven today.   Diagnosis:   DSM5: Schizophrenia Disorders:  none Obsessive-Compulsive Disorders:  none Trauma-Stressor Disorders:  Posttraumatic Stress Disorder (309.81) Substance/Addictive Disorders:  Alcohol Related Disorder - Severe (303.90) Depressive Disorders:  Major Depressive Disorder - with Psychotic Features (296.24) Total Time spent with patient: 30 minutes  Axis I: Substance Induced Mood Disorder and R/O Dissociative Disorder  ADL's:  Intact  Sleep: Poor  Appetite:  Fair  Suicidal Ideation:  Plan:  denies Intent:  denies Means:  denies Homicidal Ideation:  Plan:  denies Intent:  denies Means:  denies AEB (as evidenced by):  Psychiatric Specialty Exam: Physical Exam  Review of Systems  Constitutional: Positive for malaise/fatigue.  HENT: Negative.   Eyes: Negative.   Respiratory: Positive for cough. Negative for sputum production, shortness of breath and wheezing.   Cardiovascular: Negative.   Gastrointestinal: Negative.   Genitourinary: Negative.   Musculoskeletal: Positive for myalgias.  Skin: Negative.   Neurological: Negative.   Endo/Heme/Allergies: Negative.    Psychiatric/Behavioral: Positive for depression, hallucinations and substance abuse. Negative for suicidal ideas and memory loss. The patient is nervous/anxious and has insomnia.     Blood pressure 122/81, pulse 63, temperature 98 F (36.7 C), temperature source Oral, resp. rate 18, height 5' 2.5" (1.588 m), weight 86.637 kg (191 lb).Body mass index is 34.36 kg/(m^2).  General Appearance: Fairly Groomed  Engineer, water::  Fair  Speech:  Clear and Coherent, Slow and not spontaneous  Volume:  Decreased  Mood:  Anxious and Depressed  Affect:  Flat, Tearful and sad, perplexed  Thought Process:  Coherent and Goal Directed  Orientation:  Full (Time, Place, and Person)  Thought Content:  Hallucinations: Auditory and Rumination  Suicidal Thoughts:  No  Homicidal Thoughts:  No  Memory:  Immediate;   Fair Recent;   Fair Remote;   Fair  Judgement:  Fair  Insight:  superficial  Psychomotor Activity:  Psychomotor Retardation  Concentration:  Fair  Recall:  [place]: Fair  Akathisia:  No  Handed:    AIMS (if indicated):     Assets:  Desire for Improvement Housing Social Support  Sleep:  Number of Hours: 5.75   Musculoskeletal: Strength & Muscle Tone: within normal limits Gait & Station: normal Patient leans: N/A  Current Medications: Current Facility-Administered Medications  Medication Dose Route Frequency Provider Last Rate Last Dose  . ipratropium (ATROVENT) nebulizer solution 0.5 mg  0.5 mg Nebulization Q6H PRN Nicholaus Bloom, MD   0.5 mg at 09/17/13 8546   And  . albuterol (PROVENTIL) (2.5 MG/3ML) 0.083% nebulizer solution 2.5 mg  2.5 mg Nebulization Q6H PRN Nicholaus Bloom, MD   2.5 mg at  09/17/13 0906  . alum & mag hydroxide-simeth (MAALOX/MYLANTA) 200-200-20 MG/5ML suspension 30 mL  30 mL Oral Q4H PRN Laverle Hobby, PA-C      . citalopram (CELEXA) tablet 40 mg  40 mg Oral Daily Laverle Hobby, PA-C   40 mg at 09/17/13 0839  . cycloSPORINE  (RESTASIS) 0.05 % ophthalmic emulsion 1 drop  1 drop Both Eyes BID Laverle Hobby, PA-C   1 drop at 09/17/13 [phone]  . fluticasone (FLONASE) 50 MCG/ACT nasal spray 2 spray  2 spray Each Nare Daily Nicholaus Bloom, MD   2 spray at 09/17/13 [phone]  . guaiFENesin (MUCINEX) 12 hr tablet 600 mg  600 mg Oral BID Encarnacion Slates, NP   600 mg at 09/16/13 [id number]  . guaiFENesin-dextromethorphan (ROBITUSSIN DM) 100-10 MG/5ML syrup 15 mL  15 mL Oral Q4H PRN Nicholaus Bloom, MD   15 mL at 09/17/13 0843  . hydrochlorothiazide (HYDRODIURIL) tablet 25 mg  25 mg Oral Daily Nicholaus Bloom, MD   25 mg at 09/17/13 [phone]  . ibuprofen (ADVIL,MOTRIN) tablet 400 mg  400 mg Oral Q6H PRN Laverle Hobby, PA-C   400 mg at 09/17/13 1306  . lisinopril (PRINIVIL,ZESTRIL) tablet 20 mg  20 mg Oral Daily Nicholaus Bloom, MD   20 mg at 09/17/13 [phone]  . magnesium hydroxide (MILK OF MAGNESIA) suspension 30 mL  30 mL Oral Daily PRN Laverle Hobby, PA-C      . multivitamin with minerals tablet 1 tablet  1 tablet Oral Daily Laverle Hobby, PA-C   1 tablet at 09/17/13 [phone]  . nicotine polacrilex (NICORETTE) gum 2 mg  2 mg Oral PRN Encarnacion Slates, NP   2 mg at 09/17/13 1249  . pantoprazole (PROTONIX) EC tablet 40 mg  40 mg Oral Daily Laverle Hobby, PA-C   40 mg at 09/17/13 [id number]  . [START ON 09/18/2013] potassium chloride (K-DUR) CR tablet 10 mEq  10 mEq Oral Daily Encarnacion Slates, NP      . prazosin (MINIPRESS) capsule 1 mg  1 mg Oral QHS Nicholaus Bloom, MD   1 mg at 09/16/13 2128  . risperiDONE (RISPERDAL) tablet 0.5 mg  0.5 mg Oral BID Nicholaus Bloom, MD   0.5 mg at 09/17/13 0839  . thiamine (VITAMIN B-1) tablet 100 mg  100 mg Oral Daily Laverle Hobby, PA-C   100 mg at 09/17/13 [id number]  . traZODone (DESYREL) tablet 50 mg  50 mg Oral QHS,MR X 1 Laverle Hobby, PA-C   50 mg at 09/16/13 2128    Lab Results: No results found for this or any previous visit (from the past 48 hour(s)).  Physical Findings: AIMS: Facial and Oral Movements Muscles of Facial  Expression: None, normal Lips and Perioral Area: None, normal Jaw: None, normal Tongue: None, normal,Extremity Movements Upper (arms, wrists, hands, fingers): None, normal Lower (legs, knees, ankles, toes): None, normal, Trunk Movements Neck, shoulders, hips: None, normal, Overall Severity Severity of abnormal movements (highest score from questions above): None, normal Incapacitation due to abnormal movements: None, normal Patient's awareness of abnormal movements (rate only patient's report): No Awareness, Dental Status Current problems with teeth and/or dentures?: No Does patient usually wear dentures?: No  CIWA:  CIWA-Ar Total: 0 COWS:     Treatment Plan Summary: Daily contact with patient to assess and evaluate symptoms and progress in treatment Medication management  Plan:  Continue crisis management and stabilization.  Medication management: Continue Risperdal  0.5 mg BID for psychosis, Celexa 40 mg daily for depressive symptoms, Minipress 1 mg at hs for PTSD symptoms.  Encouraged patient to attend groups and participate in group counseling sessions and activities.  Discharge plan in progress.  Continue current treatment plan.  Address health issues: Mucinex DM for congestion, Robitussin DM for cough,  albuterol nebulizer treatments for complaints of wheezing/shortness of breath. Vitals reviewed and stable with afebrile status.   Medical Decision Making Problem Points:  Established problem, stable/improving (1), Review of last therapy session (1) and Review of psycho-social stressors (1) Data Points:  Review of medication regiment & side effects (2) Review of new medications or change in dosage (2)  I certify that inpatient services furnished can reasonably be expected to improve the patient's condition.   Elmarie Shiley NP-C 09/17/2013, 3:03 PM   Patient seen, evaluated and I agree with notes by Nurse Practitioner. Corena Pilgrim, MD

## 2013-09-17 NOTE — Progress Notes (Signed)
D: pt still has persistent cough, cough syrup given to relieve cough. Pt denies any withdrawal symptoms. Pt states she is feeling depressed this evening and has minimal interaction with Probation officer and others. Denies si/hi/avh. Denies any pain.  A: q 15 min safety checks. nebulizer treatment setup to be given, but pt asleep. Support and encouragement given R: pt remains safe on unit. No further complaints at this time

## 2013-09-17 NOTE — [facility] Group Notes (Signed)
[place] Group Notes:  (Nursing/MHT/Case Management/Adjunct)  Psychoeducational Group Note  Date:  09/17/2013 Time:  0900  Group Topic/Focus:  Relapse Prevention Planning:   The focus of this group is to define relapse and discuss the need for planning to combat relapse.  Participation Level: Did Not Attend  Participation Quality:  Not Applicable  Affect:  Not Applicable  Cognitive:  Not Applicable  Insight:  Not Applicable  Engagement in Group: Not Applicable  Additional Comments:    [name] 09/17/2013, 9:27 AM

## 2013-09-17 NOTE — [facility] Group Notes (Signed)
[facility] Group Notes: (Clinical Social Work)   09/17/2013      Type of Therapy:  Group Therapy   Participation Level:  Did Not Attend    Selmer Dominion, LCSW 09/17/2013, 4:51 PM

## 2013-09-17 NOTE — Progress Notes (Signed)
Patient did attend the evening speaker AA meeting.  

## 2013-09-17 NOTE — [facility] Group Notes (Signed)
[place] [facility] Group Notes:  (Nursing/MHT/Case Management/Adjunct)  Psychoeducational Group Note  Date:  09/17/2013 Time:  1400  Group Topic/Focus:  Relapse Prevention Planning:   The focus of this group is to define relapse and discuss the need for planning to combat relapse.  Participation Level: Did Not Attend  Participation Quality:  Not Applicable  Affect:  Not Applicable  Cognitive:  Not Applicable  Insight:  Not Applicable  Engagement in Group: Not Applicable  Additional Comments:    [name] 09/17/2013, 2:04 PM

## 2013-09-17 NOTE — Progress Notes (Signed)
[facility] Group Notes:  (Nursing/MHT/Case Management/Adjunct)  Date:  09/17/2013  Time:  6:28 PM  Type of Therapy:  Psychoeducational Skills  Participation Level:  Active  Participation Quality:  Appropriate and Attentive  Affect:  Appropriate  Cognitive:  Appropriate  Insight:  Appropriate  Engagement in Group:  Engaged and Supportive  Modes of Intervention:  Activity  Summary of Progress/Problems: Pts played an activity of Pictionary using coping skills. Pts stated one coping skill they have learned while they have been in the hospital that they will use outside of the hospital.  Pt stated one coping skill she will use is work on her drawing skills.  Clint Bolder 09/17/2013, 6:28 PM

## 2013-09-18 MED ORDER — TOPIRAMATE 25 MG PO TABS
25.0000 mg | ORAL_TABLET | Freq: Every day | ORAL | Status: DC
  Administered 2013-09-18: 25 mg via ORAL
  Filled 2013-09-18 (×2): qty 1

## 2013-09-18 MED ORDER — FOLIC ACID 1 MG PO TABS
1.0000 mg | ORAL_TABLET | Freq: Every day | ORAL | Status: DC
  Administered 2013-09-18 – 2013-09-22 (×5): 1 mg via ORAL
  Filled 2013-09-18 (×8): qty 1

## 2013-09-18 MED ORDER — HYDROXYZINE HCL 25 MG PO TABS: 25.0000 mg | ORAL_TABLET | Freq: Four times a day (QID) | ORAL | Status: DC | PRN

## 2013-09-18 NOTE — Progress Notes (Signed)
[facility]  09/18/2013 2:37 PM Marc MorgansKathy W [name]  MRN:  [id number] Subjective:  Julia MessierKathy endorses that she is aware of the presence of a girl and the presence of a bigger female figure who is mean, aggressive and says he is there to defend them. States that when she finds herself in  situations of stress when she is yelled at,  or event if someone would raise his or her voice, she will "disconnect" and have the sense she is in a box with the little girl inside with her, and the other female figure "Susette RacerHardy" trying to get in but she would not let it happen. She is is having enuresis. States it used to happen when she had the dreams about the little girl being chased. It is happening here every night. The prazosin is not helping the nightmares. Her husband came and he continues to express he is sacred of her. She states she understands he is afraid but she states she cant reassure him that this would not happen again but she cant as she does not know what is gong on with her, states she always thought she was crazy for having these experiences. understands that whatever is going on  with her is going to be made worst by her use of alcohol. She is committed to continue to work on abstinence. She has been felling more agitated and angry. States this is not like her as she tends to withdraw, keep everything inside. She admits she has still been hearing voices Diagnosis:   DSM5: Schizophrenia Disorders:  none Obsessive-Compulsive Disorders:  none Trauma-Stressor Disorders:  Posttraumatic Stress Disorder (309.81) Substance/Addictive Disorders:  Alcohol Related Disorder - Severe (303.90) Depressive Disorders:  Major Depressive Disorder - Severe (296.23) Total Time spent with patient: 30 minutes  Axis I: Substance Induced Mood Disorder  ADL's:  Intact  Sleep: Poor  Appetite:  Fair  Suicidal Ideation:  Plan:  denies Intent:  denies Means:  denies Homicidal Ideation:  Plan:  denies Intent:   denies Means:  denies AEB (as evidenced by):  Psychiatric Specialty Exam: Physical Exam  Review of Systems  Constitutional: Positive for malaise/fatigue.  HENT: Negative.   Eyes: Negative.   Respiratory: Positive for cough.   Cardiovascular: Negative.   Gastrointestinal: Negative.   Genitourinary: Negative.   Musculoskeletal: Negative.   Skin: Negative.   Neurological: Negative.   Endo/Heme/Allergies: Negative.   Psychiatric/Behavioral: Positive for depression and substance abuse. The patient is nervous/anxious and has insomnia.     Blood pressure 136/94, pulse 82, temperature 98 F (36.7 C), temperature source Oral, resp. rate 18, height 5' 2.5" (1.588 m), weight 86.637 kg (191 lb).Body mass index is 34.36 kg/(m^2).  General Appearance: Fairly Groomed  Patent attorneyye Contact::  Fair  Speech:  Clear and Coherent and not spontaneous  Volume:  fluctuates  Mood:  Anxious, Depressed and worried  Affect:  Restricted  Thought Process:  Coherent and Goal Directed  Orientation:  Full (Time, Place, and Person)  Thought Content:  symptoms. worries, concerns, fear of losing control  Suicidal Thoughts:  No  Homicidal Thoughts:  No  Memory:  Immediate;   Fair Recent;   Fair Remote;   Fair  Judgement:  Fair  Insight:  Present  Psychomotor Activity:  Restlessness  Concentration:  Fair  Recall:  [employer]Fair  Fund of Knowledge:Fair  Language: Fair  Akathisia:  hashad some increased irritability  Handed:    AIMS (if indicated):     Assets:  Desire for Improvement  Housing Resilience Social Support Vocational/Educational  Sleep:  Number of Hours: 4   Musculoskeletal: Strength & Muscle Tone: within normal limits Gait & Station: normal Patient leans: N/A  Current Medications: Current Facility-Administered Medications  Medication Dose Route Frequency Provider Last Rate Last Dose  . ipratropium (ATROVENT) nebulizer solution 0.5 mg  0.5 mg Nebulization Q6H PRN Nicholaus Bloom, MD   0.5 mg at 09/18/13  1101   And  . albuterol (PROVENTIL) (2.5 MG/3ML) 0.083% nebulizer solution 2.5 mg  2.5 mg Nebulization Q6H PRN Nicholaus Bloom, MD   2.5 mg at 09/18/13 1101  . alum & mag hydroxide-simeth (MAALOX/MYLANTA) 200-200-20 MG/5ML suspension 30 mL  30 mL Oral Q4H PRN Laverle Hobby, PA-C      . citalopram (CELEXA) tablet 40 mg  40 mg Oral Daily Laverle Hobby, PA-C   40 mg at 09/18/13 0850  . cycloSPORINE (RESTASIS) 0.05 % ophthalmic emulsion 1 drop  1 drop Both Eyes BID Laverle Hobby, PA-C   1 drop at 09/18/13 0847  . fluticasone (FLONASE) 50 MCG/ACT nasal spray 2 spray  2 spray Each Nare Daily Nicholaus Bloom, MD   2 spray at 09/18/13 [phone]  . guaiFENesin (MUCINEX) 12 hr tablet 600 mg  600 mg Oral BID Encarnacion Slates, NP   600 mg at 09/16/13 5621  . guaiFENesin-dextromethorphan (ROBITUSSIN DM) 100-10 MG/5ML syrup 15 mL  15 mL Oral Q4H PRN Nicholaus Bloom, MD   15 mL at 09/18/13 3086  . hydrochlorothiazide (HYDRODIURIL) tablet 25 mg  25 mg Oral Daily Nicholaus Bloom, MD   25 mg at 09/18/13 0848  . ibuprofen (ADVIL,MOTRIN) tablet 400 mg  400 mg Oral Q6H PRN Laverle Hobby, PA-C   400 mg at 09/17/13 1306  . lisinopril (PRINIVIL,ZESTRIL) tablet 20 mg  20 mg Oral Daily Nicholaus Bloom, MD   20 mg at 09/18/13 0849  . magnesium hydroxide (MILK OF MAGNESIA) suspension 30 mL  30 mL Oral Daily PRN Laverle Hobby, PA-C   30 mL at 09/18/13 1321  . multivitamin with minerals tablet 1 tablet  1 tablet Oral Daily Laverle Hobby, PA-C   1 tablet at 09/18/13 0849  . nicotine polacrilex (NICORETTE) gum 2 mg  2 mg Oral PRN Encarnacion Slates, NP   2 mg at 09/18/13 1421  . pantoprazole (PROTONIX) EC tablet 40 mg  40 mg Oral Daily Laverle Hobby, PA-C   40 mg at 09/18/13 0849  . potassium chloride (K-DUR) CR tablet 10 mEq  10 mEq Oral Daily Encarnacion Slates, NP   10 mEq at 09/18/13 0848  . prazosin (MINIPRESS) capsule 1 mg  1 mg Oral QHS Nicholaus Bloom, MD   1 mg at 09/17/13 2143  . risperiDONE (RISPERDAL) tablet 0.5 mg  0.5 mg Oral BID  Nicholaus Bloom, MD   0.5 mg at 09/18/13 0849  . thiamine (VITAMIN B-1) tablet 100 mg  100 mg Oral Daily Laverle Hobby, PA-C   100 mg at 09/18/13 0848  . traZODone (DESYREL) tablet 50 mg  50 mg Oral QHS,MR X 1 Spencer E Simon, PA-C   50 mg at 09/18/13 5784    Lab Results: No results found for this or any previous visit (from the past 79 hour(s)).  Physical Findings: AIMS: Facial and Oral Movements Muscles of Facial Expression: None, normal Lips and Perioral Area: None, normal Jaw: None, normal Tongue: None, normal,Extremity Movements Upper (arms, wrists, hands, fingers): None, normal  Lower (legs, knees, ankles, toes): None, normal, Trunk Movements Neck, shoulders, hips: None, normal, Overall Severity Severity of abnormal movements (highest score from questions above): None, normal Incapacitation due to abnormal movements: None, normal Patient's awareness of abnormal movements (rate only patient's report): No Awareness, Dental Status Current problems with teeth and/or dentures?: No Does patient usually wear dentures?: No  CIWA:  CIWA-Ar Total: 0 COWS:     Treatment Plan Summary: Daily contact with patient to assess and evaluate symptoms and progress in treatment Medication management  Plan:  Supportive approach/coping skills/relapse prevention            CBT;mindfulness            Will D/C the Prazosin             Continue to work on increasing insight in terms of the dissociative experiences            Will continue the Risperdal and the Lamictal              Will try Topamax at bedtime            As well as Vistaril             Will explore rehab options Medical Decision Making Problem Points:  Review of psycho-social stressors (1) Data Points:  Review of medication regiment & side effects (2)  I certify that inpatient services furnished can reasonably be expected to improve the patient's condition.   Chinenye Katzenberger A 09/18/2013, 2:37 PM

## 2013-09-18 NOTE — Progress Notes (Signed)
D:Pt denies hallucinations at present time. She reports that she had voices yesterday and one voice tells her what to do "get out of here" "not put up with this." She describes the voice as agitated. Another voice is a little girl's voices that is scared and says "let's go." Pt reports feeling tired this morning. She did not get out of bed for her vital signs and writer went to pt's room to take vitals.  A:Offered support, encouragement and 15 minute checks. Gave medications as ordered.  R:Pt denies si and hi. Safety maintained on the unit.

## 2013-09-18 NOTE — Clinical Social Work Note (Signed)
CSW met with pt individually to discuss aftercare options. Pt interested in referral to [facility] [place]. She did not want referral to [facility] or [place] treatment center. Pt reports that even if she does not get into i/p treatment, she is interested in med management AND therapy that focuses on her mental health issues in addition to substance abuse. Referral sent to [facility] by CSW at 2:20PM today.  [place], [place]  09/18/2013 2:24 PM

## 2013-09-18 NOTE — BHH Group Notes (Signed)
[place] LCSW Group Therapy  09/18/2013 2:40 PM  Type of Therapy:  Group Therapy  Participation Level:  Active  Participation Quality:  Attentive  Affect:  Appropriate  Cognitive:  Alert and Oriented  Insight:  Improving  Engagement in Therapy:  Improving  Modes of Intervention:  Confrontation, Discussion, Education, Exploration, Problem-solving, Rapport Building, Socialization and Support  Summary of Progress/Problems: Today's Topic: Overcoming Obstacles. Pt identified obstacles faced currently and processed barriers involved in overcoming these obstacles. Pt identified steps necessary for overcoming these obstacles and explored motivation (internal and external) for facing these difficulties head on. Pt further identified one area of concern in their lives and chose a skill of focus pulled from their "toolbox." Julia [name] was attentive and engaged throughout today's therapy group. She shared that her biggest obstacle involves hearing voices and dealing with mental health issues that she had kept secret for years. Julia [name] identified her adult children and her husband as positive supports. Julia [name] shows progress in the group setting and improving insight AEB her ability to process how having positive supports in her life can help her address her mental health issues without the fear of stigma and lack of understanding.    Smart, Latrelle Fuston LCSWA  09/18/2013, 2:40 PM

## 2013-09-18 NOTE — Progress Notes (Signed)
D:pt is calm and cooperative. Pt still has a persistent cough. Pt stated when she does cough up stuff it has a little yellow tinge, but not as bad as its been. Denies si/hi/avh. Denies pain.  A: cough syrup and cough drops given to pt. q 15 min safety checks.  R: pt remains safe on unit. Cough dissipated somewhat

## 2013-09-18 NOTE — Progress Notes (Signed)
The focus of this group is to help patients review their daily goal of treatment and discuss progress on daily workbooks. Pt attended the evening group session and responded to all discussion prompts from the [place]. Pt shared that today was a good day on the unit, the highlight of which was a visit from her husband at lunch. Pt also shared that her husband was in agreement with her choice on aftercare treatment, which made her happy. Pt reported having no additional needs from Nursing Staff this evening and reported being grateful for everyone on staff she had worked with thus far. Pt's affect was upbeat.

## 2013-09-18 NOTE — Tx Team (Signed)
Interdisciplinary Treatment Plan Update (Adult)  Date: 09/18/2013   Time Reviewed: 11:04 AM  Progress in Treatment:  Attending groups: no.  Participating in groups: no.   Taking medication as prescribed: Yes  Tolerating medication: Yes  Family/Significant othe contact made: SPE completed with pt's husband.  Patient understands diagnosis: Yes, AEB seeking treatment for ETOH detox, mood stabilization, and medication management.  Discussing patient identified problems/goals with staff: Yes  Medical problems stabilized or resolved: Yes  Denies suicidal/homicidal ideation: Yes during admission/self report.  Patient has not harmed self or Others: Yes  New problem(s) identified:  Discharge Plan or Barriers: Pt currently not attending d/c planning group. CSW assessing for appropriate referrals. According to MD and pt's husband, pt may be interested in long term treatment. CSW to meet with pt today (if she gets out of bed) in order to discuss aftercare options and begin referral process.   Additional comments: Reason for Continuation of Hospitalization: Mood stabilization Medication management  SI (passive)  Estimated length of stay: 2-3 days  For review of initial/current patient goals, please see plan of care.  Attendees:  Patient:    Family:    Physician: Carlton Adam MD 09/18/2013 11:04 AM   Nursing: Butch Penny RN 09/18/2013 11:04 AM   Clinical Social Worker [place], [place]  09/18/2013 11:04 AM   Other: Anderson Malta RN  09/18/2013 11:04 AM   Other: Jan RN  09/18/2013 11:04 AM   Other: Gerline Legacy Nurse CM  09/18/2013 11:04 AM   Other: Hardie Pulley. PA 09/18/2013 11:04 AM   Scribe for Treatment Team:  Nira Conn Smart LCSWA 09/18/2013 11:04 AM

## 2013-09-18 NOTE — BHH Group Notes (Signed)
[facility] LCSW Aftercare Discharge Planning Group Note   09/18/2013 9:49 AM  Participation Quality:  DID not ATTEND-pt in bed/refused to attend d/c planning group.   Smart, HeatherLCSWA

## 2013-09-19 MED ORDER — QUETIAPINE FUMARATE 50 MG PO TABS
50.0000 mg | ORAL_TABLET | Freq: Two times a day (BID) | ORAL | Status: DC
  Administered 2013-09-19 – 2013-09-22 (×6): 50 mg via ORAL
  Filled 2013-09-19 (×10): qty 1

## 2013-09-19 MED ORDER — TRAZODONE HCL 100 MG PO TABS
100.0000 mg | ORAL_TABLET | Freq: Every day | ORAL | Status: DC
  Administered 2013-09-20 – 2013-09-21 (×2): 100 mg via ORAL
  Filled 2013-09-19 (×5): qty 1

## 2013-09-19 MED ORDER — AZITHROMYCIN 1 G PO PACK
1.0000 g | PACK | Freq: Once | ORAL | Status: AC
  Administered 2013-09-19: 1 g via ORAL
  Filled 2013-09-19: qty 1

## 2013-09-19 MED ORDER — TOPIRAMATE 25 MG PO TABS
50.0000 mg | ORAL_TABLET | Freq: Every day | ORAL | Status: DC
  Administered 2013-09-19 – 2013-09-21 (×3): 50 mg via ORAL
  Filled 2013-09-19 (×5): qty 2

## 2013-09-19 MED ORDER — TRAZODONE HCL 50 MG PO TABS: 50.0000 mg | ORAL_TABLET | Freq: Every day | ORAL | Status: DC

## 2013-09-19 MED ORDER — AZITHROMYCIN 1 G PO PACK: 1.0000 g | PACK | Freq: Once | ORAL | Status: DC
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
## 2013-09-19 NOTE — BHH Group Notes (Signed)
Adult Psychoeducational Group Note  Date:  09/19/2013 Time:  11:30 PM  Group Topic/Focus:  Wrap-Up Group:   The focus of this group is to help patients review their daily goal of treatment and discuss progress on daily workbooks.  Participation Level:  Did Not Attend  Participation Quality:  None  Affect:  None  Cognitive:  None  Insight: None  Engagement in Group:  None  Modes of Intervention:  Discussion  Additional Comments:  Julia [name] did not attend group.  [name] A 09/19/2013, 11:30 PM

## 2013-09-19 NOTE — BHH Group Notes (Signed)
[place] LCSW Group Therapy  09/19/2013 2:34 PM  Type of Therapy:  Group Therapy  Participation Level:  Did Not Attend-pt meeting with MD. Did not attend group.   Smart, Devonda Pequignot LCSWA  09/19/2013, 2:34 PM

## 2013-09-19 NOTE — Progress Notes (Signed)
Recreation Therapy Notes  Date: 02.16.2015 Time: 2:45pm Location: [address]   Group Topic: Wellness  Goal Area(s) Addresses:  Patient will define components of whole wellness. Patient will verbalize benefit of whole wellness. Patient will identified how neglecting wellness exacerbates depression/anxiety/substance abuse/ etc.  Behavioral Response: Did not attend.   Laureen Ochs Tryce Surratt, LRT/CTRS  Lane Hacker 09/19/2013 8:37 AM

## 2013-09-19 NOTE — Progress Notes (Signed)
[facility] MD Progress Note  09/19/2013 4:17 PM Julia [name]  MRN:  [id number] Subjective: Gerica is still having enuresis and nightmares at night. She is also having some increased restlesness. Thinks the Risperdal might be contributing. States that she was drinking alcohol  up until the time she came here. Alcohol is a major problem for her. States she used alcohol a lot to cope with the way she was feeling. States that it help to deal with the recollection of the trauma. She has been having a strong influence from "Selena Batten" she is less patient more irritable. States Selena Batten talks about getting angry, agitated. She is able to control [place] but at times afraid she will not be abel to do it anymore. States that Selena Batten is trying to protect the "little girl." the little girl is always afraid wanting to "go away." Diagnosis:   DSM5: Schizophrenia Disorders:  none Obsessive-Compulsive Disorders:  none Trauma-Stressor Disorders:  Posttraumatic Stress Disorder (309.81) Substance/Addictive Disorders:  Alcohol Related Disorder - Severe (303.90) Depressive Disorders:  Major Depressive Disorder - Severe (296.23) Total Time spent with patient: 30 minutes  Axis I: Generalized Anxiety Disorder  ADL's:  Intact  Sleep: Poor  Appetite:  Fair  Suicidal Ideation:  Plan:  denies Intent:  denies Means:  denies Homicidal Ideation:  Plan:  denies Intent:  denies Means:  denies AEB (as evidenced by):  Psychiatric Specialty Exam: Physical Exam  Review of Systems  Constitutional: Negative.   HENT: Negative.   Eyes: Negative.   Respiratory: Positive for cough and sputum production.   Cardiovascular: Negative.   Gastrointestinal: Negative.   Genitourinary: Negative.   Musculoskeletal: Negative.   Skin: Negative.   Neurological: Negative.   Endo/Heme/Allergies: Negative.   Psychiatric/Behavioral: Positive for depression and substance abuse. The patient is nervous/anxious and has insomnia.     Blood pressure  137/76, pulse 92, temperature 97.8 F (36.6 C), temperature source Oral, resp. rate 20, height 5' 2.5" (1.588 m), weight 86.637 kg (191 lb).Body mass index is 34.36 kg/(m^2).  General Appearance: Fairly Groomed  Engineer, water::  Fair  Speech:  Clear and Coherent  Volume:  fluctuates  Mood:  Anxious, Depressed and irritated worried  Affect:  restricted, anxious, worried  Thought Process:  Coherent and Goal Directed  Orientation:  Full (Time, Place, and Person)  Thought Content:  symptoms, worries, concerns  Suicidal Thoughts:  No  Homicidal Thoughts:  No  Memory:  Immediate;   Fair Recent;   Fair Remote;   Fair  Judgement:  Fair  Insight:  Present  Psychomotor Activity:  Restlessness  Concentration:  Fair  Recall:  [employer] [place]: Fair  Akathisia:  possibly  Handed:    AIMS (if indicated):     Assets:  Desire for Improvement  Sleep:  Number of Hours: 6   Musculoskeletal: Strength & Muscle Tone: within normal limits Gait & Station: normal Patient leans: N/A  Current Medications: Current Facility-Administered Medications  Medication Dose Route Frequency Provider Last Rate Last Dose  . ipratropium (ATROVENT) nebulizer solution 0.5 mg  0.5 mg Nebulization Q6H PRN Nicholaus Bloom, MD   0.5 mg at 09/18/13 1101   And  . albuterol (PROVENTIL) (2.5 MG/3ML) 0.083% nebulizer solution 2.5 mg  2.5 mg Nebulization Q6H PRN Nicholaus Bloom, MD   2.5 mg at 09/19/13 1039  . alum & mag hydroxide-simeth (MAALOX/MYLANTA) 200-200-20 MG/5ML suspension 30 mL  30 mL Oral Q4H PRN Laverle Hobby, PA-C      .  azithromycin (ZITHROMAX) powder 1 g  1 g Oral Once Nicholaus Bloom, MD      . citalopram (CELEXA) tablet 40 mg  40 mg Oral Daily Laverle Hobby, PA-C   40 mg at 09/19/13 1014  . cycloSPORINE (RESTASIS) 0.05 % ophthalmic emulsion 1 drop  1 drop Both Eyes BID Laverle Hobby, PA-C   1 drop at 09/19/13 1015  . fluticasone (FLONASE) 50 MCG/ACT nasal spray 2 spray  2 spray Each Nare  Daily Nicholaus Bloom, MD   2 spray at 09/19/13 1013  . folic acid (FOLVITE) tablet 1 mg  1 mg Oral Daily Encarnacion Slates, NP   1 mg at 09/19/13 1013  . guaiFENesin (MUCINEX) 12 hr tablet 600 mg  600 mg Oral BID Encarnacion Slates, NP   600 mg at 09/16/13 1941  . guaiFENesin-dextromethorphan (ROBITUSSIN DM) 100-10 MG/5ML syrup 15 mL  15 mL Oral Q4H PRN Nicholaus Bloom, MD   15 mL at 09/19/13 1500  . hydrochlorothiazide (HYDRODIURIL) tablet 25 mg  25 mg Oral Daily Nicholaus Bloom, MD   25 mg at 09/19/13 1014  . hydrOXYzine (ATARAX/VISTARIL) tablet 25 mg  25 mg Oral Q6H PRN Nicholaus Bloom, MD      . ibuprofen (ADVIL,MOTRIN) tablet 400 mg  400 mg Oral Q6H PRN Laverle Hobby, PA-C   400 mg at 09/18/13 1617  . lisinopril (PRINIVIL,ZESTRIL) tablet 20 mg  20 mg Oral Daily Nicholaus Bloom, MD   20 mg at 09/19/13 1014  . magnesium hydroxide (MILK OF MAGNESIA) suspension 30 mL  30 mL Oral Daily PRN Laverle Hobby, PA-C   30 mL at 09/18/13 1321  . multivitamin with minerals tablet 1 tablet  1 tablet Oral Daily Laverle Hobby, PA-C   1 tablet at 09/19/13 1013  . nicotine polacrilex (NICORETTE) gum 2 mg  2 mg Oral PRN Encarnacion Slates, NP   2 mg at 09/19/13 1459  . pantoprazole (PROTONIX) EC tablet 40 mg  40 mg Oral Daily Laverle Hobby, PA-C   40 mg at 09/19/13 1013  . potassium chloride (K-DUR) CR tablet 10 mEq  10 mEq Oral Daily Encarnacion Slates, NP   10 mEq at 09/19/13 1014  . QUEtiapine (SEROQUEL) tablet 50 mg  50 mg Oral BID Nicholaus Bloom, MD      . thiamine (VITAMIN B-1) tablet 100 mg  100 mg Oral Daily Laverle Hobby, PA-C   100 mg at 09/19/13 1014  . topiramate (TOPAMAX) tablet 50 mg  50 mg Oral QHS Nicholaus Bloom, MD      . traZODone (DESYREL) tablet 100 mg  100 mg Oral QHS Nicholaus Bloom, MD        Lab Results: No results found for this or any previous visit (from the past 48 hour(s)).  Physical Findings: AIMS: Facial and Oral Movements Muscles of Facial Expression: None, normal Lips and Perioral Area: None,  normal Jaw: None, normal Tongue: None, normal,Extremity Movements Upper (arms, wrists, hands, fingers): None, normal Lower (legs, knees, ankles, toes): None, normal, Trunk Movements Neck, shoulders, hips: None, normal, Overall Severity Severity of abnormal movements (highest score from questions above): None, normal Incapacitation due to abnormal movements: None, normal Patient's awareness of abnormal movements (rate only patient's report): No Awareness, Dental Status Current problems with teeth and/or dentures?: No Does patient usually wear dentures?: No  CIWA:  CIWA-Ar Total: 0 COWS:     Treatment Plan Summary: Daily  contact with patient to assess and evaluate symptoms and progress in treatment Medication management  Plan: Supportive approach/coping skills/relapse prevention           D/C the Risperdal will try Seroquel           Increase the Topamax at night           CBT;mindfulness  Medical Decision Making Problem Points:  Established problem, worsening (2), Review of last therapy session (1) and Review of psycho-social stressors (1) Data Points:  Review of medication regiment & side effects (2) Review of new medications or change in dosage (2)  I certify that inpatient services furnished can reasonably be expected to improve the patient's condition.   Woodrow Dulski A 09/19/2013, 4:17 PM

## 2013-09-19 NOTE — Progress Notes (Signed)
Patient in bed sleeping all evening. She did not attend group. She refused her 2000 pm Trazodone due to excessive sedation. Although she took her HS dosage of Topamax. She denied SI/HI and denied hallucinations. Mood and affect flat and depressed. Writer supported patient. Q 15 minute check continues as ordered to maintain safety.

## 2013-09-19 NOTE — Progress Notes (Signed)
[facility] Group Notes:  (Nursing/MHT/Case Management/Adjunct)  Date:  09/19/2013  Time:  3:35 PM  Type of Therapy:  Psychoeducational Skills  Participation Level:  Active  Participation Quality:  Appropriate  Affect:  Appropriate  Cognitive:  Alert and Appropriate  Insight:  Appropriate  Engagement in Group:  Engaged  Modes of Intervention:  Discussion  Summary of Progress/Problems: In group today we discussed coping skills and played coping skills pictionary. Pt said that she didn't have any coping skills. But, I discussed with her what a coping skill is and how to find some that work for her. Then she was able to list: sewing, fishing, knitting, watching tv, and listening to music.  Harrie Foreman A 09/19/2013, 3:35 PM

## 2013-09-19 NOTE — Progress Notes (Signed)
Pt reports she is doing a little better, but she did mention that she was frustrated at not having a fresh orange available for snack.  She says she usually has an orange every day.  Explained to pt that some times fresh fruit was available and sometimes not.  Writer looked in all the galleys for an orange and even called the C/A unit to see if they had any.  MHT offered an apple which pt accepted as a substitute.  Pt has been calm and cooperative with Probation officer.  Previous shift nurse reported that pt had been irritable during the day.  Pt says she has been to most of the groups.  She is getting along with her roommate well.  Pt voices no other needs or concerns at this time.  Pt says she is interested in long term treatment.  Support and encouragement offered.  Safety maintained with q15 minute checks.

## 2013-09-20 NOTE — Progress Notes (Addendum)
Pt rated both her depression a 7 anxiety 8 on her self-inventory.  She denies any S/H ideations.  She is talking about hearing voices she stated,"the voices get me agitated" and she stated,"I feel the little girl behind me"   Pt wants to go to [facility] from here.

## 2013-09-20 NOTE — Progress Notes (Signed)
Recreation Therapy Notes  Date: 02.18.2015 Time: 2:45pm Location: [address] Dayroom    Group Topic: Boundaries  Goal Area(s) Addresses:  Patient will identify benefit of establishing healthy boundaries.  Patient will identify what is preventing establishing healthy boundaries.   Behavioral Response: Did not attned  Lane Hacker, LRT/CTRS   Lane Hacker 09/20/2013 7:06 PM

## 2013-09-20 NOTE — Progress Notes (Signed)
Pt did not attend NA group meeting.  

## 2013-09-20 NOTE — BHH Group Notes (Signed)
[facility] Group Notes:  (Nursing/MHT/Case Management/Adjunct)  Date: 09/19/2013  Time: 0900 am   Type of Therapy:  Psychoeducational Skills  Participation Level:  Did Not Attend   Zipporah Plants 09/20/2013, 11:08 AM

## 2013-09-20 NOTE — BHH Group Notes (Signed)
[facility] LCSW Aftercare Discharge Planning Group Note   09/20/2013 10:44 AM  Participation Quality:  DID NOT ATTEND-pt meeting with PA during group and did not return after being taken out of group. CSW spoke with Julia [name] (UR). Pt's insurance may not cover inpatient treatment at [facility]. Julia [name] at this facility stated that her insurance is verified and that if they don't cover her, they may be able to work something out with insurance to cover partial services. Pt has bed available Thurs/Fri if insurance issues are worked out. CSW contacted husband for update.   Smart, [employer]

## 2013-09-20 NOTE — Progress Notes (Signed)
[facility] MD Progress Note  09/20/2013 4:59 PM Julia [name]  MRN:  [id number] Subjective:  Does not recall having nightmares last night. She did not have the enuresis. Still having the sense of a different entity by her side  talking to her. Last night states she was having a difficulty time with the staff and "Selena Batten' wanted to come out but she was able to control him. He states she is working on being assertive. removing herself from the conflictive situations and rather than retrieving and never coming back to address it ( she removes herself concerned that Selena Batten" would come out,) she will work towards processing what happened and come back to talk about it in a rational way. She is still concerned very well aware that she has to abstain from drinking as alcohol can make things worst for her.  Diagnosis:   DSM5: Schizophrenia Disorders:  none Obsessive-Compulsive Disorders:  none Trauma-Stressor Disorders:  Posttraumatic Stress Disorder (309.81) Substance/Addictive Disorders:  Alcohol Related Disorder - Severe (303.90) Depressive Disorders:  Major Depressive Disorder - Severe (296.23) Total Time spent with patient: 30 minutes  Axis I: Generalized Anxiety Disorder and Substance Induced Mood Disorder  ADL's:  Intact  Sleep: Poor  Appetite:  Fair  Suicidal Ideation:  Plan:  denies Intent:  denies Means:  denies Homicidal Ideation:  Plan:  denies Intent:  denies Means:  denies AEB (as evidenced by):  Psychiatric Specialty Exam: Physical Exam  Review of Systems  Constitutional: Negative.   HENT: Negative.   Eyes: Negative.   Respiratory: Negative.   Cardiovascular: Negative.   Gastrointestinal: Negative.   Genitourinary: Negative.   Musculoskeletal: Negative.   Skin: Negative.   Neurological: Negative.   Endo/Heme/Allergies: Negative.   Psychiatric/Behavioral: Positive for depression and substance abuse. The patient is nervous/anxious.     Blood pressure 135/86, pulse 90,  temperature 97.7 F (36.5 C), temperature source Oral, resp. rate 22, height 5' 2.5" (1.588 m), weight 86.637 kg (191 lb).Body mass index is 34.36 kg/(m^2).  General Appearance: Fairly Groomed  Engineer, water::  Fair  Speech:  Clear and Coherent  Volume:  Decreased  Mood:  Anxious and worried  Affect:  Restricted  Thought Process:  Coherent and Goal Directed  Orientation:  Full (Time, Place, and Person)  Thought Content:  symtpoms, worries, concerns  Suicidal Thoughts:  No  Homicidal Thoughts:  No  Memory:  Immediate;   Fair Recent;   Fair Remote;   Fair  Judgement:  Fair  Insight:  Present  Psychomotor Activity:  Restlessness  Concentration:  Fair  Recall:  [employer] [place]: Fair  Akathisia:  No  Handed:    AIMS (if indicated):     Assets:  Desire for Improvement Housing Social Support Vocational/Educational  Sleep:  Number of Hours: 5.75   Musculoskeletal: Strength & Muscle Tone: within normal limits Gait & Station: normal Patient leans: N/A  Current Medications: Current Facility-Administered Medications  Medication Dose Route Frequency Provider Last Rate Last Dose  . ipratropium (ATROVENT) nebulizer solution 0.5 mg  0.5 mg Nebulization Q6H PRN Nicholaus Bloom, MD   0.5 mg at 09/20/13 0848   And  . albuterol (PROVENTIL) (2.5 MG/3ML) 0.083% nebulizer solution 2.5 mg  2.5 mg Nebulization Q6H PRN Nicholaus Bloom, MD   2.5 mg at 09/20/13 0848  . alum & mag hydroxide-simeth (MAALOX/MYLANTA) 200-200-20 MG/5ML suspension 30 mL  30 mL Oral Q4H PRN Laverle Hobby, PA-C      . citalopram (CELEXA)  tablet 40 mg  40 mg Oral Daily Laverle Hobby, PA-C   40 mg at 09/20/13 [id number]  . cycloSPORINE (RESTASIS) 0.05 % ophthalmic emulsion 1 drop  1 drop Both Eyes BID Laverle Hobby, PA-C   1 drop at 09/20/13 1659  . fluticasone (FLONASE) 50 MCG/ACT nasal spray 2 spray  2 spray Each Nare Daily Nicholaus Bloom, MD   2 spray at 09/20/13 [phone]  . folic acid (FOLVITE) tablet 1 mg   1 mg Oral Daily Encarnacion Slates, NP   1 mg at 09/20/13 [id number]  . guaiFENesin-dextromethorphan (ROBITUSSIN DM) 100-10 MG/5ML syrup 15 mL  15 mL Oral Q4H PRN Nicholaus Bloom, MD   15 mL at 09/20/13 1608  . hydrochlorothiazide (HYDRODIURIL) tablet 25 mg  25 mg Oral Daily Nicholaus Bloom, MD   25 mg at 09/20/13 [id number]  . hydrOXYzine (ATARAX/VISTARIL) tablet 25 mg  25 mg Oral Q6H PRN Nicholaus Bloom, MD      . ibuprofen (ADVIL,MOTRIN) tablet 400 mg  400 mg Oral Q6H PRN Laverle Hobby, PA-C   400 mg at 09/20/13 1607  . lisinopril (PRINIVIL,ZESTRIL) tablet 20 mg  20 mg Oral Daily Nicholaus Bloom, MD   20 mg at 09/20/13 [phone]  . magnesium hydroxide (MILK OF MAGNESIA) suspension 30 mL  30 mL Oral Daily PRN Laverle Hobby, PA-C   30 mL at 09/18/13 1321  . multivitamin with minerals tablet 1 tablet  1 tablet Oral Daily Laverle Hobby, PA-C   1 tablet at 09/20/13 [id number]  . nicotine polacrilex (NICORETTE) gum 2 mg  2 mg Oral PRN Encarnacion Slates, NP   2 mg at 09/20/13 0847  . pantoprazole (PROTONIX) EC tablet 40 mg  40 mg Oral Daily Laverle Hobby, PA-C   40 mg at 09/20/13 [id number]  . potassium chloride (K-DUR) CR tablet 10 mEq  10 mEq Oral Daily Encarnacion Slates, NP   10 mEq at 09/20/13 0836  . QUEtiapine (SEROQUEL) tablet 50 mg  50 mg Oral BID Nicholaus Bloom, MD   50 mg at 09/20/13 1658  . thiamine (VITAMIN B-1) tablet 100 mg  100 mg Oral Daily Laverle Hobby, PA-C   100 mg at 09/20/13 [id number]  . topiramate (TOPAMAX) tablet 50 mg  50 mg Oral QHS Nicholaus Bloom, MD   50 mg at 09/19/13 2131  . traZODone (DESYREL) tablet 100 mg  100 mg Oral QHS Nicholaus Bloom, MD        Lab Results: No results found for this or any previous visit (from the past 48 hour(s)).  Physical Findings: AIMS: Facial and Oral Movements Muscles of Facial Expression: None, normal Lips and Perioral Area: None, normal Jaw: None, normal Tongue: None, normal,Extremity Movements Upper (arms, wrists, hands, fingers): None, normal Lower (legs, knees, ankles, toes):  None, normal, Trunk Movements Neck, shoulders, hips: None, normal, Overall Severity Severity of abnormal movements (highest score from questions above): None, normal Incapacitation due to abnormal movements: None, normal Patient's awareness of abnormal movements (rate only patient's report): No Awareness, Dental Status Current problems with teeth and/or dentures?: No Does patient usually wear dentures?: No  CIWA:  CIWA-Ar Total: 0 COWS:     Treatment Plan Summary: Daily contact with patient to assess and evaluate symptoms and progress in treatment Medication management  Plan: Supportive approach/coping skills/relapse prevention           Pursue the Seroquel/lamictal/Topamax combination  Medical Decision Making Problem Points:  Review of psycho-social stressors (1) Data Points:  Review of medication regiment & side effects (2) Review of new medications or change in dosage (2)  I certify that inpatient services furnished can reasonably be expected to improve the patient's condition.   Jkayla Spiewak A 09/20/2013, 4:59 PM

## 2013-09-20 NOTE — Progress Notes (Signed)
Patient ID: Julia [name], female   DOB: Sep 03, 1955, 58 y.o.   MRN: [id number]  D: Patient in room on approach. Pt reports increase anxiety over placement but was hopeful she will be admitted soon. Pt mood/affect appeared anxious. Pt denies SI/HI/AVH and pain. Pt did not attend NA group this evening. Pt denies any needs or concerns.  Cooperative with assessment. No acute distressed noted at this time.   A: Met with pt 1:1. Medications administered as prescribed. Writer encouraged pt to discuss feelings. Pt encouraged to come to staff with any question or concerns.   R: Patient remains safe. She is complaint with medications and denies any adverse reaction. Continue current POC.

## 2013-09-20 NOTE — Clinical Social Work Note (Signed)
Julia [name] from [facility] called CSW back. Pt completed phone interview-informed Julia [name] that she is actively hallucinating (hearing voice of little girl and grown man). This facility cannot accept pt due to [facility]. CSW contacted pt's husband to discuss alternative plan. Per MD, pt would beneift from seeing Dr. 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 Pt also in need of CDIOP and med management. Pt's husband voiced concern regarding pt's history of not attending aftercare appts. CSW to discuss o/p options with pt and the importance of followup. Pt's husband to think about his comfort with pt returning home and will touch base with CSW in the AM.   [facility], LCSWA 09/20/2013 3:45 PM

## 2013-09-20 NOTE — BHH Group Notes (Signed)
[facility] LCSW Group Therapy  09/20/2013 3:21 PM  Type of Therapy:  Group Therapy  Participation Level:  Active  Participation Quality:  Attentive  Affect:  Appropriate  Cognitive:  Alert and Oriented  Insight:  Engaged  Engagement in Therapy:  Engaged  Modes of Intervention:  Confrontation, Discussion, Education, Problem-solving, Rapport Building, Socialization and Support  Summary of Progress/Problems: Emotion Regulation: This group focused on both positive and negative emotion identification and allowed group members to process ways to identify feelings, regulate negative emotions, and find healthy ways to manage internal/external emotions. Group members were asked to reflect on a time when their reaction to an emotion led to a negative outcome and explored how alternative responses using emotion regulation would have benefited them. Group members were also asked to discuss a time when emotion regulation was utilized when a negative emotion was experienced. Julia [name] was attentive and engaged throughout today's therapy group. She shared that she struggles with anxiety and panic. Julia [name] was able to identify how anxiety feels both physically and emotionally. Julia [name] shows progress in the group setting and improving insight AEB her ability to process how "asking for help when I need it, confronting situations that make me nervous rather than running away or isolating."    Smart, Carleen Rhue LCSWA  09/20/2013, 3:21 PM

## 2013-09-21 NOTE — Progress Notes (Signed)
Pt has been up today she rated her depression 5 hopelessness 6 and anxiety 8 on her self-inventory.  She still talks about "I have the little girl behind me" She denies any S/H ideations.  [facility]

## 2013-09-21 NOTE — Progress Notes (Signed)
Recreation Therapy Notes  Date: 02.19.2015 Time: 2:45pm Location: [address] Dayroom   Group Topic: Communication, Team Building, Problem Solving  Goal Area(s) Addresses:  Patient will effectively work with peer towards shared goal.  Patient will identify skill used to make activity successful.  Patient will identify how skills used during activity can be used to reach post d/c goals.   Behavioral Response: Did not attend.   Laureen Ochs Lajeana Strough, LRT/CTRS  Ivey Cina L 09/21/2013 9:00 PM

## 2013-09-21 NOTE — Clinical Social Work Note (Signed)
Per Terrence Dupont at [facility], pt accepted now that she denies AVH. Pt requires 30 day med supply (Dr. Sabra Heck notified and agreeable to this). Pt's husband notified and will pick her up around lunchtime to transport her directly to facility. Pt completed phone assessment. Per Terrence Dupont, no need for pt to call in the AM to do this again. Pt on schedule to arrive between 1:30pm-3pm at [facility].   [place], LCSWA 09/21/2013 4:17 PM

## 2013-09-21 NOTE — BHH Group Notes (Signed)
[facility] LCSW Group Therapy  09/21/2013 2:24 PM  Type of Therapy:  Group Therapy  Participation Level:  Did Not Attend-pt meeting with MD-did not come to group.   Smart, [name] LCSWA  09/21/2013, 2:24 PM

## 2013-09-21 NOTE — Progress Notes (Signed)
[facility] Group Notes:  (Nursing/MHT/Case Management/Adjunct)  Date:  09/21/2013  Time:  2100  Type of Therapy:  wrap up grop  Participation Level:  Active  Participation Quality:  Appropriate, Attentive, Sharing and Supportive  Affect:  Appropriate  Cognitive:  Alert and Appropriate  Insight:  Good  Engagement in Group:  Engaged  Modes of Intervention:  Clarification, Education and Support  Summary of Progress/Problems: Pt plans on attending further treatment in [place], [place]. Pt shares that she knows she needs this and she is optimistic about it. Pt stated, " I feel like I always take one step forward, then three steps back". Pt shared that alcohol was always a quick fix for her but it is time that she addresses the underlying problem.   Jacques Navy 09/21/2013, 10:44 PM

## 2013-09-21 NOTE — Progress Notes (Signed)
[facility] MD Progress Note  09/21/2013 12:48 PM Julia [name]  MRN:  [id number] Subjective:  Julia [name] endorses that she if feeling much better. Has not experienced any nightmares or enuresis in the last two nights,or dissociative episodes or heard voices. (the voices are not true hallucinations but symptoms of the dissociative experience). She states she is ready to move on and work on her alcoholism.  Diagnosis:   DSM5: Schizophrenia Disorders:  none Obsessive-Compulsive Disorders:  none Trauma-Stressor Disorders:  Posttraumatic Stress Disorder (309.81) Substance/Addictive Disorders:  Alcohol Related Disorder - Severe (303.90) Depressive Disorders:  Major Depressive Disorder - Moderate (296.22) Total Time spent with patient: 30 minutes  Axis I: Substance Induced Mood Disorder  ADL's:  Intact  Sleep: Fair  Appetite:  Fair  Suicidal Ideation:  Plan:  denies Intent:  denies Means:  denies Homicidal Ideation:  Plan:  denies Intent:  denies Means:  denies AEB (as evidenced by):  Psychiatric Specialty Exam: Physical Exam  Review of Systems  Constitutional: Negative.   HENT: Negative.   Eyes: Negative.   Respiratory: Negative.   Cardiovascular: Negative.   Gastrointestinal: Negative.   Genitourinary: Negative.   Musculoskeletal: Negative.   Skin: Negative.   Neurological: Negative.   Endo/Heme/Allergies: Negative.   Psychiatric/Behavioral: Positive for substance abuse. The patient is nervous/anxious.     Blood pressure 108/72, pulse 75, temperature 97.2 F (36.2 C), temperature source Oral, resp. rate 18, height 5' 2.5" (1.588 m), weight 86.637 kg (191 lb).Body mass index is 34.36 kg/(m^2).  General Appearance: Fairly Groomed  Engineer, water::  Fair  Speech:  Clear and Coherent  Volume:  Normal  Mood:  Euthymic  Affect:  Appropriate  Thought Process:  Coherent and Goal Directed  Orientation:  Full (Time, Place, and Person)  Thought Content:  worries concerns  Suicidal Thoughts:   No  Homicidal Thoughts:  No  Memory:  Immediate;   Fair Recent;   Fair Remote;   Fair  Judgement:  Fair  Insight:  Present  Psychomotor Activity:  Normal  Concentration:  Fair  Recall:  [employer] [place]  Language: Fair  Akathisia:  No  Handed:    AIMS (if indicated):     Assets:  Desire for Improvement Housing Social Support Vocational/Educational  Sleep:  Number of Hours: 6.75   Musculoskeletal: Strength & Muscle Tone: within normal limits Gait & Station: normal Patient leans: N/A  Current Medications: Current Facility-Administered Medications  Medication Dose Route Frequency Provider Last Rate Last Dose  . ipratropium (ATROVENT) nebulizer solution 0.5 mg  0.5 mg Nebulization Q6H PRN Nicholaus Bloom, MD   0.5 mg at 09/20/13 0848   And  . albuterol (PROVENTIL) (2.5 MG/3ML) 0.083% nebulizer solution 2.5 mg  2.5 mg Nebulization Q6H PRN Nicholaus Bloom, MD   2.5 mg at 09/20/13 0848  . alum & mag hydroxide-simeth (MAALOX/MYLANTA) 200-200-20 MG/5ML suspension 30 mL  30 mL Oral Q4H PRN Laverle Hobby, PA-C      . citalopram (CELEXA) tablet 40 mg  40 mg Oral Daily Laverle Hobby, PA-C   40 mg at 09/21/13 0735  . cycloSPORINE (RESTASIS) 0.05 % ophthalmic emulsion 1 drop  1 drop Both Eyes BID Laverle Hobby, PA-C   1 drop at 09/21/13 0734  . fluticasone (FLONASE) 50 MCG/ACT nasal spray 2 spray  2 spray Each Nare Daily Nicholaus Bloom, MD   2 spray at 09/21/13 [phone]  . folic acid (FOLVITE) tablet 1 mg  1 mg Oral Daily Herbert Pun  I Nw[place], NP   1 mg at 09/21/13 0732  . guaiFENesin-dextromethorphan (ROBITUSSIN DM) 100-10 MG/5ML syrup 15 mL  15 mL Oral Q4H PRN Nicholaus Bloom, MD   15 mL at 09/21/13 0740  . hydrochlorothiazide (HYDRODIURIL) tablet 25 mg  25 mg Oral Daily Nicholaus Bloom, MD   25 mg at 09/21/13 0732  . hydrOXYzine (ATARAX/VISTARIL) tablet 25 mg  25 mg Oral Q6H PRN Nicholaus Bloom, MD      . ibuprofen (ADVIL,MOTRIN) tablet 400 mg  400 mg Oral Q6H PRN Laverle Hobby, PA-C   400  mg at 09/20/13 1607  . lisinopril (PRINIVIL,ZESTRIL) tablet 20 mg  20 mg Oral Daily Nicholaus Bloom, MD   20 mg at 09/21/13 0732  . magnesium hydroxide (MILK OF MAGNESIA) suspension 30 mL  30 mL Oral Daily PRN Laverle Hobby, PA-C   30 mL at 09/18/13 1321  . multivitamin with minerals tablet 1 tablet  1 tablet Oral Daily Laverle Hobby, PA-C   1 tablet at 09/21/13 0731  . nicotine polacrilex (NICORETTE) gum 2 mg  2 mg Oral PRN Encarnacion Slates, NP   2 mg at 09/20/13 2129  . pantoprazole (PROTONIX) EC tablet 40 mg  40 mg Oral Daily Laverle Hobby, PA-C   40 mg at 09/21/13 0731  . potassium chloride (K-DUR) CR tablet 10 mEq  10 mEq Oral Daily Encarnacion Slates, NP   10 mEq at 09/21/13 0732  . QUEtiapine (SEROQUEL) tablet 50 mg  50 mg Oral BID Nicholaus Bloom, MD   50 mg at 09/21/13 0735  . thiamine (VITAMIN B-1) tablet 100 mg  100 mg Oral Daily Laverle Hobby, PA-C   100 mg at 09/21/13 0732  . topiramate (TOPAMAX) tablet 50 mg  50 mg Oral QHS Nicholaus Bloom, MD   50 mg at 09/20/13 2129  . traZODone (DESYREL) tablet 100 mg  100 mg Oral QHS Nicholaus Bloom, MD   100 mg at 09/20/13 2129    Lab Results: No results found for this or any previous visit (from the past 48 hour(s)).  Physical Findings: AIMS: Facial and Oral Movements Muscles of Facial Expression: None, normal Lips and Perioral Area: None, normal Jaw: None, normal Tongue: None, normal,Extremity Movements Upper (arms, wrists, hands, fingers): None, normal Lower (legs, knees, ankles, toes): None, normal, Trunk Movements Neck, shoulders, hips: None, normal, Overall Severity Severity of abnormal movements (highest score from questions above): None, normal Incapacitation due to abnormal movements: None, normal Patient's awareness of abnormal movements (rate only patient's report): No Awareness, Dental Status Current problems with teeth and/or dentures?: No Does patient usually wear dentures?: No  CIWA:  CIWA-Ar Total: 0 COWS:     Treatment Plan  Summary: Daily contact with patient to assess and evaluate symptoms and progress in treatment Medication management  Plan: Supportive approach/coping skills           CBT;mindfulness           Continue medications           Refer to a [place] to continue to work on her alcohoism  Medical Decision Making Problem Points:  Review of psycho-social stressors (1) Data Points:  Review of medication regiment & side effects (2)  I certify that inpatient services furnished can reasonably be expected to improve the patient's condition.   Abbey Veith A 09/21/2013, 12:48 PM

## 2013-09-21 NOTE — Progress Notes (Signed)
The focus of this group is to educate the patient on the purpose and policies of crisis stabilization and provide a format to answer questions about their admission.  The group details unit policies and expectations of patients while admitted.   Pt was appropriate in group her goal "be cordial to others"

## 2013-09-22 MED ORDER — TOPIRAMATE 50 MG PO TABS: 50.0000 mg | ORAL_TABLET | Freq: Every day | ORAL | Status: DC

## 2013-09-22 MED ORDER — HYDROXYZINE HCL 25 MG PO TABS: ORAL_TABLET | ORAL | Status: DC

## 2013-09-22 MED ORDER — LISINOPRIL-HYDROCHLOROTHIAZIDE 20-25 MG PO TABS: 1.0000 | ORAL_TABLET | Freq: Every day | ORAL | Status: DC

## 2013-09-22 MED ORDER — TRAZODONE HCL 100 MG PO TABS: 100.0000 mg | ORAL_TABLET | Freq: Every day | ORAL | Status: DC

## 2013-09-22 MED ORDER — ALBUTEROL SULFATE (2.5 MG/3ML) 0.083% IN NEBU: 2.5000 mg | INHALATION_SOLUTION | Freq: Four times a day (QID) | RESPIRATORY_TRACT | Status: DC | PRN

## 2013-09-22 MED ORDER — QUETIAPINE FUMARATE 50 MG PO TABS: 50.0000 mg | ORAL_TABLET | Freq: Two times a day (BID) | ORAL | Status: DC

## 2013-09-22 MED ORDER — PANTOPRAZOLE SODIUM 40 MG PO TBEC: 40.0000 mg | DELAYED_RELEASE_TABLET | Freq: Every day | ORAL | Status: DC

## 2013-09-22 MED ORDER — CYCLOSPORINE 0.05 % OP EMUL: 1.0000 [drp] | Freq: Two times a day (BID) | OPHTHALMIC | Status: DC

## 2013-09-22 MED ORDER — FLUTICASONE PROPIONATE 50 MCG/ACT NA SUSP: 2.0000 | Freq: Every day | NASAL | Status: DC

## 2013-09-22 MED ORDER — IPRATROPIUM BROMIDE 0.02 % IN SOLN: 0.5000 mg | Freq: Four times a day (QID) | RESPIRATORY_TRACT | Status: DC | PRN

## 2013-09-22 MED ORDER — FOLIC ACID 1 MG PO TABS: 1.0000 mg | ORAL_TABLET | Freq: Every day | ORAL | Status: DC

## 2013-09-22 MED ORDER — CITALOPRAM HYDROBROMIDE 40 MG PO TABS: 40.0000 mg | ORAL_TABLET | Freq: Every day | ORAL | Status: DC

## 2013-09-22 MED ORDER — POTASSIUM CHLORIDE ER 10 MEQ PO TBCR: 10.0000 meq | EXTENDED_RELEASE_TABLET | Freq: Every day | ORAL | Status: DC

## 2013-09-22 MED ORDER — IBUPROFEN 200 MG PO TABS: 400.0000 mg | ORAL_TABLET | Freq: Four times a day (QID) | ORAL | Status: DC | PRN

## 2013-09-22 NOTE — Progress Notes (Signed)
[facility] Adult Case Management Discharge Plan :  Will you be returning to the same living situation after discharge: Yes,  pt can return home after treatment At discharge, do you have transportation home?:Yes,  husband will pick pt up today and transport to [place] Do you have the ability to pay for your medications:Yes,  provided pt with prescriptions and pt verbalizes ability to afford meds  Release of information consent forms completed and in the chart;  Patient's signature needed at discharge.  Patient to Follow up at: Follow-up Information   Follow up with [facility] On 09/22/2013. (Arrive today by 3:00 pm for further inpatient treatment)    Contact information:   [place] [address] Phone: [phone] Fax: [phone]      Patient denies SI/HI:   Yes,  denies SI/HI    Safety Planning and Suicide Prevention discussed:  Yes,  discussed with pt and pt's husband.  See suicide prevention education note.   Ane Payment 09/22/2013, 10:56 AM

## 2013-09-22 NOTE — Progress Notes (Signed)
Pt d/c from hospital with her husband. All items returned. D/C instructions given and prescription given. Nasal and eye drops in pt's med drawer returned. Pt denies si and hi.

## 2013-09-22 NOTE — BHH Group Notes (Cosign Needed)
[facility] LCSW Aftercare Discharge Planning Group Note   09/22/2013  8:45 AM  Participation Quality:  Did Not Attend - pt sleeping in her room  Regan Lemming, LCSW 09/22/2013 9:39 AM

## 2013-09-22 NOTE — Progress Notes (Signed)
Patient ID: Julia [name], female   DOB: 1956-07-14, 58 y.o.   MRN: [id number]  D: Patient reports possible discharge tomorrow with husband picking  up to the treatment center. Pt mood/affect is appropriate to situation. Pt denies SI/HI/AVH and pain. Pt denies any needs or concerns.  Cooperative with assessment. No acute distressed noted at this time.   A: Met with pt 1:1. Medications administered as prescribed. Writer encouraged pt to discuss feelings. Pt encouraged to come to staff with any question or concerns.   R: Patient remains safe. She is complaint with medications and denies any adverse reaction. Continue current POC.

## 2013-09-22 NOTE — Discharge Summary (Signed)
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 She appears well-developed.  HENT:  Head: Normocephalic.  Eyes: Pupils are equal, round, and reactive to light.  Neck: Normal range of motion.  Cardiovascular: Normal rate.   Respiratory: Effort normal.  GI: Soft.  Genitourinary:  Denies any issues to this area  Musculoskeletal: Normal range of motion.  Neurological: She is alert and oriented to person, place, and time.  Skin: Skin is warm and dry.  Psychiatric: Her speech is normal and behavior is normal. Judgment and thought content normal. Her mood appears not anxious. Her affect is not angry, not blunt, not labile and not inappropriate. Cognition and memory are normal. She does not exhibit a depressed mood.    Review of Systems  Constitutional: Negative.   HENT: Negative.   Eyes: Negative.   Respiratory: Negative.   Cardiovascular: Negative.   Gastrointestinal: Negative.   Genitourinary: Negative.   Musculoskeletal: Negative.   Skin: Negative.   Neurological: Negative.   Endo/Heme/Allergies: Negative.   Psychiatric/Behavioral: Positive for depression (SDtabilized with medication prior to discharge) and substance abuse (Alcoholism, chronic). Negative for suicidal ideas, hallucinations and  memory loss. The patient is nervous/anxious (Stabilized with medication prior to discharge) and has insomnia (Stable).     Blood pressure 121/80, pulse 73, temperature 97.6 F (36.4 C), temperature source Oral, resp. rate 16, height 5' 2.5" (1.588 m), weight 86.637 kg (191 lb).Body mass index is 34.36 kg/(m^2).  General Appearance: Fairly Groomed  Engineer, water::  Good  Speech:  Clear and Coherent  Volume:  Normal  Mood:  Stable  Affect:  Appropriate and Congruent  Thought Process:  Coherent and Goal Directed  Orientation:  Full (Time, Place, and Person)  Thought Content:  Denies any hallucinations, delusional thoughts and or paranoia  Suicidal Thoughts:  No  Homicidal Thoughts:  No  Memory:  Immediate;   Good Recent;   Good Remote;   Good  Judgement:  Intact  Insight:  Present  Psychomotor Activity:  Normal  Concentration:  Good  Recall:  Good  Fund of Knowledge:Fair  Language: Good  Akathisia:  No  Handed:  Right  AIMS (if indicated):     Assets:  Desire for Improvement  Sleep:  Number of Hours: 4.75    Past Psychiatric History: Diagnosis: Alcohol related disorder-severe, PTSD with dissociative symptoms,  Severe recurrent depression with psychotic features, mood cngruent      Hospitalizations: [facility]  Outpatient Care: [facility]  Substance Abuse Care: [facility]  Self-Mutilation: Denies  Suicidal Attempts: Denies  Violent Behaviors: Denies   Musculoskeletal: Strength & Muscle Tone: within normal limits Gait & Station: normal Patient leans: N/A  DSM5: Schizophrenia Disorders:  NA Obsessive-Compulsive Disorders:  NA Trauma-Stressor Disorders:  PTSD with dissociative symptoms and depersonalization Substance/Addictive Disorders:  Alcohol Related Disorder - Severe (303.90) Depressive Disorders:  Severe  recurrent depression with psychotic features, mood cngruent   Axis Diagnosis:  AXIS I:  Alcohol related disorder-severe, PTSD with dissociative  symptoms,  Severe recurrent depression with psychotic features, mood cngruent     AXIS II:  Deferred AXIS III:   Past Medical History  Diagnosis Date  . ETOH abuse   . Meningitis   . Depression, major     Suicide attempt w/ injection of KCL  . Hypertension   . GERD (gastroesophageal reflux disease)   . Anxiety disorder   . Obesity    AXIS IV:  Chronic mental illness, alcoholism, chronic AXIS V:  62  Level of Care:  [facility]  Hospital Course:  58 Y/O female who states that the depression kept gettng worst over the last year and a half. "I just want to sleep." Admits she is drinking "not a lot" states she drinks two or three mix drinks a day, occasional beer, and wine.   [name] received Librium detox treatment while in this hospital for alcohol detox. She was admitted with blood alcohol level of 16 per toxicology report. She was also enrolled in group counseling sessions/activities, including AA/NA meetings being held on this unit. She was ordered and received medication management for mood stabilization. [name] was resumed in all her pertinent home medication for her other medical issues. She tolerated her treatment regimen without any adverse effects and or reactions.  [name] has completed detox treatment and her mood stabilized. This is evidenced by her reports of improved mood and absence of withdrawal symptoms. she is currently being discharged to continue substance abuse treatment at the [facility] in [place]. [name] was provided with 30 days worth of prescriptions for all her discharged medications. Upon discharge, she adamantly denies any SIHI, AVH, delusional thoughts, paranoia and or withdrawal symptoms. She left [facility] with all personal belongings in no apparent distress. Transportation per husband.  Consults:  psychiatry  Significant Diagnostic Studies:  labs: Current lab reports on file, reviewed, no changes  Discharge Vitals:   Blood pressure 121/80, pulse 73, temperature  97.6 F (36.4 C), temperature source Oral, resp. rate 16, height 5' 2.5" (1.588 m), weight 86.637 kg (191 lb). Body mass index is 34.36 kg/(m^2). Lab Results:   No results found for this or any previous visit (from the past 72 hour(s)).  Physical Findings: AIMS: Facial and Oral Movements Muscles of Facial Expression: None, normal Lips and Perioral Area: None, normal Jaw: None, normal Tongue: None, normal,Extremity Movements Upper (arms, wrists, hands, fingers): None, normal Lower (legs, knees, ankles, toes): None, normal, Trunk Movements Neck, shoulders, hips: None, normal, Overall Severity Severity of abnormal movements (highest score from questions above): None, normal Incapacitation due to abnormal movements: None, normal Patient's awareness of abnormal movements (rate only patient's report): No Awareness, Dental Status Current problems with teeth and/or dentures?: No Does patient usually wear dentures?: No  CIWA:  CIWA-Ar Total: 0 COWS:     Psychiatric Specialty Exam: See Psychiatric Specialty Exam and Suicide Risk Assessment completed by Attending Physician prior to discharge.  Discharge destination:  [facility] ([facility] in [place], [place])  Is patient on multiple antipsychotic therapies at discharge:  No   Has Patient had three or more failed trials of antipsychotic monotherapy by history:  No  Recommended Plan for Multiple Antipsychotic Therapies: NA    Medication List    STOP taking these medications       ADVIL COLD & SINUS LIQUI-GELS 30-200 MG Caps  Generic drug:  Pseudoephedrine-Ibuprofen  NASACORT ALLERGY 24HR 55 MCG/ACT Aero nasal inhaler  Generic drug:  triamcinolone     varenicline 1 MG tablet  Commonly known as:  CHANTIX CONTINUING MONTH PAK      TAKE these medications     Indication   albuterol (2.5 MG/3ML) 0.083% nebulizer solution  Commonly known as:  PROVENTIL  Take 3 mLs (2.5 mg total) by nebulization every 6 (six) hours as needed for  wheezing or shortness of breath.   Indication:  Acute Bronchospasm     citalopram 40 MG tablet  Commonly known as:  CELEXA  Take 1 tablet (40 mg total) by mouth daily. For depression   Indication:  Depression     cycloSPORINE 0.05 % ophthalmic emulsion  Commonly known as:  RESTASIS  Place 1 drop into both eyes 2 (two) times daily. For dry eyes   Indication:  Drying and Inflammation of Cornea and Conjunctiva of Eyes     fluticasone 50 MCG/ACT nasal spray  Commonly known as:  FLONASE  Place 2 sprays into both nostrils daily. For allergies   Indication:  Perennial Rhinitis, Hayfever     folic acid 1 MG tablet  Commonly known as:  FOLVITE  Take 1 tablet (1 mg total) by mouth daily. For low folate in the blood   Indication:  Deficiency of Folic Acid in the Diet     hydrOXYzine 25 MG tablet  Commonly known as:  ATARAX/VISTARIL  Take 1 tablet (25 mg) four times daily for anxiety/tension   Indication:  Anxiety associated with Organic Disease, Tension     ibuprofen 200 MG tablet  Commonly known as:  ADVIL,MOTRIN  Take 2 tablets (400 mg total) by mouth every 6 (six) hours as needed for headache or mild pain. For headache   Indication:  Mild to Moderate Pain     ipratropium 0.02 % nebulizer solution  Commonly known as:  ATROVENT  Take 2.5 mLs (0.5 mg total) by nebulization every 6 (six) hours as needed for wheezing or shortness of breath.   Indication:  Asthma     lisinopril-hydrochlorothiazide 20-25 MG per tablet  Commonly known as:  PRINZIDE,ZESTORETIC  Take 1 tablet by mouth daily. For high blood pressure   Indication:  High Blood Pressure     pantoprazole 40 MG tablet  Commonly known as:  PROTONIX  Take 1 tablet (40 mg total) by mouth daily. For acid reflux   Indication:  Gastroesophageal Reflux Disease     potassium chloride 10 MEQ tablet  Commonly known as:  K-DUR  Take 1 tablet (10 mEq total) by mouth daily. For low potassium replacement   Indication:  Low Amount of  Potassium in the Blood     QUEtiapine 50 MG tablet  Commonly known as:  SEROQUEL  Take 1 tablet (50 mg total) by mouth 2 (two) times daily. For mood control   Indication:  Mood control     topiramate 50 MG tablet  Commonly known as:  TOPAMAX  Take 1 tablet (50 mg total) by mouth at bedtime. For mood stabilization   Indication:  Mood stabilization     traZODone 100 MG tablet  Commonly known as:  DESYREL  Take 1 tablet (100 mg total) by mouth at bedtime. For sleep   Indication:  Trouble Sleeping       Follow-up Information   Follow up with [facility] On 09/22/2013. (Arrive today by 3:00 pm for further inpatient treatment)    Contact information:   [address] [place] [address] Phone: [phone] Fax: [phone]     Follow-up recommendations: Activity:  As tolerated Diet: As recommended by your primary care doctor. Keep all scheduled follow-up appointments as recommended.  Continue to work your relapse prevention plan Pursue individual psychotherapy Comments:  Take all your medications as prescribed by your mental healthcare provider. Report any adverse effects and or reactions from your medicines to your outpatient provider promptly. Patient is instructed and cautioned to not engage in alcohol and or illegal drug use while on prescription medicines. In the event of worsening symptoms, patient is instructed to call the crisis hotline, 911 and or go to the nearest ED for appropriate evaluation and treatment of symptoms. Follow-up with your primary care provider for your other medical issues, concerns and or health care needs.   Total Discharge Time:  Greater than 30 minutes.  Signed: Encarnacion Slates, [place], FNP 09/22/2013, 10:58 AM Agree with assessment and plan Woodroe Chen. Sabra Heck, M.D.

## 2013-09-22 NOTE — Tx Team (Signed)
Interdisciplinary Treatment Plan Update (Adult)  Date: 09/22/2013  Time Reviewed:  9:45 AM  Progress in Treatment: Attending groups: Yes Participating in groups:  Yes Taking medication as prescribed:  Yes Tolerating medication:  Yes Family/Significant othe contact made: Yes, with pt's husband Patient understands diagnosis:  Yes Discussing patient identified problems/goals with staff:  Yes Medical problems stabilized or resolved:  Yes Denies suicidal/homicidal ideation: Yes Issues/concerns per patient self-inventory:  Yes Other:  New problem(s) identified: N/A  Discharge Plan or Barriers: Pt will follow up at [facility] for further inpatient treatment today.    Reason for Continuation of Hospitalization: Stable to d/c today  Comments: N/A  Estimated length of stay: D/C today  For review of initial/current patient goals, please see plan of care.  Attendees: Patient:     Family:     Physician:  Dr. Sabra Heck 09/22/2013 9:46 AM   Nursing:   Desma Paganini, RN 09/22/2013 9:46 AM   Clinical Social Worker:  Regan Lemming, LCSW 09/22/2013 9:46 AM   Other: Agustina Caroli, NP 09/22/2013 9:46 AM   Other: Eduard Roux, RN 09/22/2013 9:46 AM   Other:  Lars Pinks, case manager 09/22/2013 9:49 AM   Other:     Other:    Other:    Other:    Other:    Other:    Other:     Scribe for Treatment Team:   Ane Payment, 09/22/2013 , 9:46 AM

## 2013-09-22 NOTE — Progress Notes (Signed)
Adult Psychoeducational Group Note  Date:  09/22/2013 Time:  12:45 PM  Group Topic/Focus:  Early Warning Signs:   The focus of this group is to help patients identify signs or symptoms they exhibit before slipping into an unhealthy state or crisis.  Participation Level:  Did not attend.  Additional Comments:  Pt was in bed asleep.  Eily Louvier C 09/22/2013, 12:45 PM 
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
## 2013-09-27 NOTE — Progress Notes (Signed)
Patient Discharge Instructions:  After Visit Summary (AVS):   Faxed to:  09/27/13 Discharge Summary Note:   Faxed to:  09/27/13 Psychiatric Admission Assessment Note:   Faxed to:  09/27/13 Suicide Risk Assessment - Discharge Assessment:   Faxed to:  09/27/13 Faxed/Sent to the Next Level Care provider:  09/27/13 Faxed to [facility] @ [place], 09/27/2013, 3:23 PM

## 2013-11-17 ENCOUNTER — Other Ambulatory Visit: Payer: Self-pay | Admitting: Family Medicine

## 2013-12-14 ENCOUNTER — Other Ambulatory Visit: Payer: Self-pay | Admitting: Family Medicine

## 2014-01-22 ENCOUNTER — Other Ambulatory Visit: Payer: Self-pay | Admitting: Family Medicine

## 2014-01-23 ENCOUNTER — Other Ambulatory Visit: Payer: Self-pay | Admitting: Family Medicine

## 2014-02-13 ENCOUNTER — Ambulatory Visit: Payer: Managed Care, Other (non HMO) | Admitting: Family Medicine

## 2014-02-22 ENCOUNTER — Other Ambulatory Visit: Payer: Self-pay | Admitting: Family Medicine

## 2014-02-23 ENCOUNTER — Other Ambulatory Visit: Payer: Self-pay | Admitting: Family Medicine

## 2014-03-28 ENCOUNTER — Other Ambulatory Visit: Payer: Self-pay | Admitting: Family Medicine

## 2014-03-29 ENCOUNTER — Other Ambulatory Visit: Payer: Self-pay | Admitting: Family Medicine

## 2014-05-01 ENCOUNTER — Other Ambulatory Visit: Payer: Self-pay | Admitting: Family Medicine

## 2014-05-02 ENCOUNTER — Other Ambulatory Visit: Payer: Self-pay | Admitting: Family Medicine

## 2014-05-30 ENCOUNTER — Other Ambulatory Visit: Payer: Self-pay | Admitting: Family Medicine

## 2014-05-31 ENCOUNTER — Other Ambulatory Visit: Payer: Self-pay | Admitting: Family Medicine

## 2014-06-28 ENCOUNTER — Other Ambulatory Visit: Payer: Self-pay | Admitting: Family Medicine

## 2014-06-30 ENCOUNTER — Other Ambulatory Visit: Payer: Self-pay | Admitting: Family Medicine

## 2014-07-28 ENCOUNTER — Other Ambulatory Visit: Payer: Self-pay | Admitting: Family Medicine

## 2014-07-29 ENCOUNTER — Other Ambulatory Visit: Payer: Self-pay | Admitting: Family Medicine

## 2014-08-09 ENCOUNTER — Other Ambulatory Visit: Payer: Self-pay | Admitting: Family Medicine

## 2014-08-10 ENCOUNTER — Other Ambulatory Visit: Payer: Self-pay | Admitting: Family Medicine

## 2014-08-28 ENCOUNTER — Ambulatory Visit (INDEPENDENT_AMBULATORY_CARE_PROVIDER_SITE_OTHER): Payer: Managed Care, Other (non HMO) | Admitting: Family Medicine

## 2014-08-28 ENCOUNTER — Encounter: Payer: Self-pay | Admitting: Family Medicine

## 2014-08-28 VITALS — BP 122/74 | Ht 62.5 in | Wt 164.0 lb

## 2014-08-28 DIAGNOSIS — R945 Abnormal results of liver function studies: Secondary | ICD-10-CM

## 2014-08-28 DIAGNOSIS — R7989 Other specified abnormal findings of blood chemistry: Secondary | ICD-10-CM

## 2014-08-28 DIAGNOSIS — I10 Essential (primary) hypertension: Secondary | ICD-10-CM

## 2014-08-28 DIAGNOSIS — Z13220 Encounter for screening for lipoid disorders: Secondary | ICD-10-CM

## 2014-08-28 DIAGNOSIS — K219 Gastro-esophageal reflux disease without esophagitis: Secondary | ICD-10-CM

## 2014-08-28 DIAGNOSIS — G4700 Insomnia, unspecified: Secondary | ICD-10-CM

## 2014-08-28 DIAGNOSIS — Z79899 Other long term (current) drug therapy: Secondary | ICD-10-CM

## 2014-08-28 MED ORDER — HYDROXYZINE HCL 25 MG PO TABS: ORAL_TABLET | ORAL | Status: DC

## 2014-08-28 MED ORDER — PANTOPRAZOLE SODIUM 40 MG PO TBEC: 40.0000 mg | DELAYED_RELEASE_TABLET | Freq: Every day | ORAL | Status: DC

## 2014-08-28 MED ORDER — POTASSIUM CHLORIDE CRYS ER 10 MEQ PO TBCR: 10.0000 meq | EXTENDED_RELEASE_TABLET | Freq: Every day | ORAL | Status: DC

## 2014-08-28 MED ORDER — LISINOPRIL-HYDROCHLOROTHIAZIDE 20-25 MG PO TABS: 1.0000 | ORAL_TABLET | Freq: Every morning | ORAL | Status: DC

## 2014-08-28 NOTE — Patient Instructions (Signed)
You have had labs ordered as part of today's visit. These test are necessary for [place] to provide good care. Failure to complete the labs in a timely basis can cause significant risk to your health. It is your responsibility to do the test. These orders are good for 30 days. After 30 days they are cancelled by the system and require new orders. Labs are drawn at [facility], located across from the hospital.Please do your test as soon as possible!! All results are forwarded to you via phone call for urgent findings and via "My Chart" or letter for routine findings.Letters typically take 7 to 10 days for you to receive.If you don't receive results within 2 weeks please call us for the results.  In the future,for chronic visits we encourage you to have labs ordered and completed a few days before your visit so we can have a detailed discussion about the results. Doing labs for chronic health issues ( thyroid,diabetes, cholesterol,etc.) before your visit allows for you to have a more thorough and efficient visit.  Lab orders can be given by our office to you by request.

## 2014-08-28 NOTE — Progress Notes (Signed)
   Subjective:    Patient ID: Julia [name], female    DOB: [birth year], 59 y.o.   MRN: [id number]  Hypertension This is a chronic problem. The current episode started more than 1 year ago. Pertinent negatives include no chest pain or shortness of breath. There are no compliance problems.   pt has lost about 30 lbs. She has been exercising and watching her diet.  pt states no concerns.   Patient states she does need refills on medication  Review of Systems  Constitutional: Negative for activity change, appetite change and fatigue.  Respiratory: Negative for cough and shortness of breath.   Cardiovascular: Negative for chest pain.  Endocrine: Negative for polydipsia and polyphagia.  Genitourinary: Negative for frequency.  Neurological: Negative for weakness.  Psychiatric/Behavioral: Negative for confusion.       Objective:   Physical Exam  Constitutional: She appears well-nourished. No distress.  Cardiovascular: Normal rate, regular rhythm and normal heart sounds.   No murmur heard. Pulmonary/Chest: Effort normal and breath sounds normal. No respiratory distress.  Musculoskeletal: She exhibits no edema.  Lymphadenopathy:    She has no cervical adenopathy.  Neurological: She is alert. She exhibits normal muscle tone.  Psychiatric: Her behavior is normal.  Vitals reviewed.         Assessment & Plan:  Colon recommended Patient is doing a great job exercising watching diet losing weight Blood pressure under good control continue current measures Reflux under good control she will try reducing the medication every other day and see if she can still keep it under good control She is no longer smoking She denies depression denies alcohol use She has a history of insomnia she uses hydroxyzine for this She does have a history of liver function problems we will check that

## 2014-08-29 LAB — BASIC METABOLIC PANEL
BUN: 11 mg/dL (ref 6–23)
CALCIUM: 10 mg/dL (ref 8.4–10.5)
CHLORIDE: 100 meq/L (ref 96–112)
CO2: 33 mEq/L — ABNORMAL HIGH (ref 19–32)
CREATININE: 0.87 mg/dL (ref 0.50–1.10)
GLUCOSE: 97 mg/dL (ref 70–99)
Potassium: 3.4 mEq/L — ABNORMAL LOW (ref 3.5–5.3)
Sodium: 141 mEq/L (ref 135–145)

## 2014-08-29 LAB — LIPID PANEL
CHOLESTEROL: 223 mg/dL — AB (ref 0–200)
HDL: 50 mg/dL (ref >39)
LDL CALC: 147 mg/dL — AB (ref 0–99)
Total CHOL/HDL Ratio: 4.5 Ratio
Triglycerides: 129 mg/dL (ref <150)
VLDL: 26 mg/dL (ref 0–40)

## 2014-08-29 LAB — HEPATIC FUNCTION PANEL
ALT: 23 U/L (ref 0–35)
AST: 21 U/L (ref 0–37)
Albumin: 4.4 g/dL (ref 3.5–5.2)
Alkaline Phosphatase: 53 U/L (ref 39–117)
Bilirubin, Direct: 0.1 mg/dL (ref 0.0–0.3)
Indirect Bilirubin: 0.6 mg/dL (ref 0.2–1.2)
TOTAL PROTEIN: 7 g/dL (ref 6.0–8.3)
Total Bilirubin: 0.7 mg/dL (ref 0.2–1.2)

## 2014-09-12 ENCOUNTER — Telehealth: Payer: Self-pay | Admitting: *Deleted

## 2014-09-12 NOTE — Telephone Encounter (Signed)
From Labwork : Overall lab work looks very good. If patient is interested in getting a copy please send to her. What let her know her cholesterol did show her bad cholesterol mildly elevated at 147 ideally we would love for it to be less than 100 but we will accept less than 1:30. I recommend the patient to maintain a healthy diet regular physical activity in repeat her cholesterol again in approximately 4 months with a follow-up office visit. If cholesterol persistently stays elevated she may need to go on medication

## 2014-09-12 NOTE — Telephone Encounter (Signed)
Left message to return call 

## 2014-09-25 NOTE — Telephone Encounter (Signed)
Blood work results discussed with patient. Patient advised  From Labwork : Overall lab work looks very good. If patient is interested in getting a copy please send to her. What let her know her cholesterol did show her bad cholesterol mildly elevated at 147 ideally we would love for it to be less than 100 but we will accept less than 130 Doctors recommends the patient to maintain a healthy diet regular physical activity in repeat her cholesterol again in approximately 4 months with a follow-up office visit. If cholesterol persistently stays elevated she may need to go on medication. Patient verbalized understanding.

## 2015-02-26 ENCOUNTER — Ambulatory Visit (INDEPENDENT_AMBULATORY_CARE_PROVIDER_SITE_OTHER): Payer: Managed Care, Other (non HMO) | Admitting: Family Medicine

## 2015-02-26 ENCOUNTER — Encounter: Payer: Self-pay | Admitting: Family Medicine

## 2015-02-26 VITALS — BP 110/80 | Ht 62.5 in | Wt 167.1 lb

## 2015-02-26 DIAGNOSIS — Z79899 Other long term (current) drug therapy: Secondary | ICD-10-CM | POA: Diagnosis not present

## 2015-02-26 DIAGNOSIS — E876 Hypokalemia: Secondary | ICD-10-CM

## 2015-02-26 DIAGNOSIS — K219 Gastro-esophageal reflux disease without esophagitis: Secondary | ICD-10-CM | POA: Diagnosis not present

## 2015-02-26 DIAGNOSIS — E785 Hyperlipidemia, unspecified: Secondary | ICD-10-CM | POA: Diagnosis not present

## 2015-02-26 DIAGNOSIS — B9689 Other specified bacterial agents as the cause of diseases classified elsewhere: Secondary | ICD-10-CM

## 2015-02-26 DIAGNOSIS — I10 Essential (primary) hypertension: Secondary | ICD-10-CM | POA: Diagnosis not present

## 2015-02-26 DIAGNOSIS — J019 Acute sinusitis, unspecified: Secondary | ICD-10-CM

## 2015-02-26 MED ORDER — PANTOPRAZOLE SODIUM 40 MG PO TBEC: 40.0000 mg | DELAYED_RELEASE_TABLET | Freq: Every day | ORAL | Status: DC

## 2015-02-26 MED ORDER — POTASSIUM CHLORIDE CRYS ER 10 MEQ PO TBCR: 10.0000 meq | EXTENDED_RELEASE_TABLET | Freq: Every day | ORAL | Status: DC

## 2015-02-26 MED ORDER — LEVOFLOXACIN 500 MG PO TABS: 500.0000 mg | ORAL_TABLET | Freq: Every day | ORAL | Status: DC

## 2015-02-26 MED ORDER — LISINOPRIL-HYDROCHLOROTHIAZIDE 20-25 MG PO TABS: 1.0000 | ORAL_TABLET | Freq: Every morning | ORAL | Status: DC

## 2015-02-26 NOTE — Progress Notes (Signed)
   Subjective:    Patient ID: Julia [name], female    DOB: 1956/03/11, 59 y.o.   MRN: [id number]  Hypertension This is a chronic problem. The current episode started more than 1 year ago. The problem has been gradually improving since onset. Pertinent negatives include no chest pain or shortness of breath. There are no associated agents to hypertension. There are no known risk factors for coronary artery disease. Treatments tried: lisinopril. The current treatment provides moderate improvement. There are no compliance problems.    Patient states that she has a sinus infection. Yellow congestion, headaches noted. This has present for about 2 weeks now. Treatments tried: Flonase with no relief.    patient does smoke occasionally she know she needs to quit totally  patient relates reflux under decent control with medication  allergy issues under decent control  Review of Systems  Constitutional: Negative for fever, activity change, appetite change and fatigue.  HENT: Positive for rhinorrhea. Negative for congestion and ear pain.   Eyes: Negative for discharge.  Respiratory: Negative for cough, shortness of breath and wheezing.   Cardiovascular: Negative for chest pain.  Gastrointestinal: Negative for abdominal pain.  Endocrine: Negative for polydipsia and polyphagia.  Neurological: Negative for weakness.  Psychiatric/Behavioral: Negative for confusion.       Objective:   Physical Exam  Constitutional: She appears well-developed and well-nourished. No distress.  HENT:  Head: Normocephalic.  Nose: Nose normal.  Mouth/Throat: Oropharynx is clear and moist. No oropharyngeal exudate.  Neck: Neck supple.  Cardiovascular: Normal rate, regular rhythm and normal heart sounds.   No murmur heard. Pulmonary/Chest: Effort normal and breath sounds normal. No respiratory distress. She has no wheezes.  Musculoskeletal: She exhibits no edema.  Lymphadenopathy:    She has no cervical adenopathy.    Neurological: She is alert. She exhibits normal muscle tone.  Skin: Skin is warm and dry.  Psychiatric: Her behavior is normal.  Nursing note and vitals reviewed.         Assessment & Plan:  1. Essential hypertension  blood pressure good control continue current measures  2. Gastroesophageal reflux disease without esophagitis  reflux controlled with medication  3. Hyperlipidemia  lipid panel ordered low fat diet - Lipid panel  4. Hypokalemia  history of hypo- Kayleen year check potassium and magnesium - Magnesium  5. Acute bacterial rhinosinusitis  has sinus infection antibiotics prescribed  6. High risk medication use  diuretic use met 7 - Basic metabolic panel

## 2015-03-31 ENCOUNTER — Encounter: Payer: Self-pay | Admitting: Family Medicine

## 2015-08-07 ENCOUNTER — Ambulatory Visit (INDEPENDENT_AMBULATORY_CARE_PROVIDER_SITE_OTHER): Payer: Managed Care, Other (non HMO) | Admitting: Family Medicine

## 2015-08-07 ENCOUNTER — Encounter: Payer: Self-pay | Admitting: Family Medicine

## 2015-08-07 VITALS — BP 112/74 | Temp 98.4°F | Ht 62.5 in | Wt 165.0 lb

## 2015-08-07 DIAGNOSIS — B338 Other specified viral diseases: Secondary | ICD-10-CM | POA: Diagnosis not present

## 2015-08-07 DIAGNOSIS — J019 Acute sinusitis, unspecified: Secondary | ICD-10-CM | POA: Diagnosis not present

## 2015-08-07 DIAGNOSIS — B348 Other viral infections of unspecified site: Secondary | ICD-10-CM

## 2015-08-07 DIAGNOSIS — B9689 Other specified bacterial agents as the cause of diseases classified elsewhere: Secondary | ICD-10-CM

## 2015-08-07 MED ORDER — LEVOFLOXACIN 500 MG PO TABS: 500.0000 mg | ORAL_TABLET | Freq: Every day | ORAL | Status: DC

## 2015-08-07 NOTE — Progress Notes (Signed)
   Subjective:    Patient ID: Julia [name], female    DOB: July 19, 1956, 60 y.o.   MRN: [id number]  Cough This is a new problem. Episode onset: 4 days ago. Associated symptoms include ear pain, a fever, nasal congestion, rhinorrhea, a sore throat and wheezing. Pertinent negatives include no chest pain or shortness of breath. Treatments tried: ibuprofen, cold and sinus meds.    patient with significant head congestion drainage coughing start off low-grade fever now the fevers gone away. She findings a self feeling tired fatigue ADD coughing denies wheezing or shortness of breath   Review of Systems  Constitutional: Positive for fever. Negative for activity change.  HENT: Positive for congestion, ear pain, rhinorrhea and sore throat.   Eyes: Negative for discharge.  Respiratory: Positive for cough and wheezing. Negative for shortness of breath.   Cardiovascular: Negative for chest pain.       Objective:   Physical Exam  Constitutional: She appears well-developed.  HENT:  Head: Normocephalic.  Nose: Nose normal.  Mouth/Throat: Oropharynx is clear and moist. No oropharyngeal exudate.  Neck: Neck supple.  Cardiovascular: Normal rate and normal heart sounds.   No murmur heard. Pulmonary/Chest: Effort normal and breath sounds normal. She has no wheezes.  Lymphadenopathy:    She has no cervical adenopathy.  Skin: Skin is warm and dry.  Nursing note and vitals reviewed.    patient not toxic not respiratory distress      Assessment & Plan:  Patient was seen today for upper respiratory illness. It is felt that the patient is dealing with sinusitis. Antibiotics were prescribed today. Importance of compliance with medication was discussed. Symptoms should gradually resolve over the course of the next several days. If high fevers, progressive illness, difficulty breathing, worsening condition or failure for symptoms to improve over the next several days then the patient is to follow-up. If  any emergent conditions the patient is to follow-up in the emergency department otherwise to follow-up in the office.  I do not hear any pneumonia with this patient I would not recommend any x-rays or lab work.

## 2015-08-24 ENCOUNTER — Other Ambulatory Visit: Payer: Self-pay | Admitting: Family Medicine

## 2015-09-14 ENCOUNTER — Other Ambulatory Visit: Payer: Self-pay | Admitting: Family Medicine

## 2015-09-24 ENCOUNTER — Encounter: Payer: Self-pay | Admitting: Family Medicine

## 2015-10-02 ENCOUNTER — Other Ambulatory Visit: Payer: Self-pay | Admitting: Family Medicine

## 2015-10-04 ENCOUNTER — Other Ambulatory Visit: Payer: Self-pay | Admitting: Family Medicine

## 2015-10-06 ENCOUNTER — Other Ambulatory Visit: Payer: Self-pay | Admitting: Family Medicine

## 2015-11-04 ENCOUNTER — Other Ambulatory Visit: Payer: Self-pay | Admitting: Family Medicine

## 2015-12-03 ENCOUNTER — Telehealth: Payer: Self-pay | Admitting: Family Medicine

## 2015-12-03 NOTE — Telephone Encounter (Signed)
error 

## 2015-12-09 ENCOUNTER — Other Ambulatory Visit: Payer: Self-pay | Admitting: Family Medicine

## 2015-12-09 ENCOUNTER — Telehealth: Payer: Self-pay | Admitting: Family Medicine

## 2015-12-09 DIAGNOSIS — Z79899 Other long term (current) drug therapy: Secondary | ICD-10-CM

## 2015-12-09 DIAGNOSIS — I10 Essential (primary) hypertension: Secondary | ICD-10-CM

## 2015-12-09 DIAGNOSIS — E785 Hyperlipidemia, unspecified: Secondary | ICD-10-CM

## 2015-12-09 NOTE — Telephone Encounter (Signed)
Blood work ordered in [facility]. Patient notified. 

## 2015-12-09 NOTE — Telephone Encounter (Signed)
Pt is requesting lab orders to be sent over to [facility]. The orders that are in the system have expired.

## 2015-12-09 NOTE — Telephone Encounter (Signed)
Please call work number [phone]

## 2015-12-09 NOTE — Telephone Encounter (Signed)
Lipid, liver, metabolic 7, magnesium-hyperlipidemia, hypertension, diuretic use

## 2016-01-11 ENCOUNTER — Other Ambulatory Visit: Payer: Self-pay | Admitting: Family Medicine

## 2016-02-06 ENCOUNTER — Telehealth: Payer: Self-pay | Admitting: Family Medicine

## 2016-02-06 NOTE — Telephone Encounter (Signed)
Pt working in [place], [place] & is not able to get home to get labs done - needs lab orders transferred to that location of [facility] (ph# [phone])  Pt called [facility] & was told that they are not able to transfer the orders from one location to another     ?'s please call pt  & please call pt when done

## 2016-02-06 NOTE — Telephone Encounter (Signed)
Patient was notified that labs have been ordered through [facility] and should be able to have them done at any [place] facility.

## 2016-03-12 LAB — BASIC METABOLIC PANEL
BUN / CREAT RATIO: 13 (ref 9–23)
BUN: 11 mg/dL (ref 6–24)
CALCIUM: 9.7 mg/dL (ref 8.7–10.2)
CHLORIDE: 97 mmol/L (ref 96–106)
CO2: 28 mmol/L (ref 18–29)
Creatinine, Ser: 0.82 mg/dL (ref 0.57–1.00)
GFR calc non Af Amer: 79 mL/min/{1.73_m2} (ref >59)
GFR, EST AFRICAN AMERICAN: 91 mL/min/{1.73_m2} (ref >59)
Glucose: 105 mg/dL — ABNORMAL HIGH (ref 65–99)
POTASSIUM: 3.4 mmol/L — AB (ref 3.5–5.2)
Sodium: 142 mmol/L (ref 134–144)

## 2016-03-12 LAB — HEPATIC FUNCTION PANEL
ALT: 53 IU/L — AB (ref 0–32)
AST: 48 IU/L — AB (ref 0–40)
Albumin: 4.6 g/dL (ref 3.5–5.5)
Alkaline Phosphatase: 55 IU/L (ref 39–117)
BILIRUBIN TOTAL: 0.6 mg/dL (ref 0.0–1.2)
BILIRUBIN, DIRECT: 0.14 mg/dL (ref 0.00–0.40)
Total Protein: 7 g/dL (ref 6.0–8.5)

## 2016-03-12 LAB — LIPID PANEL
CHOLESTEROL TOTAL: 219 mg/dL — AB (ref 100–199)
Chol/HDL Ratio: 3.2 ratio units (ref 0.0–4.4)
HDL: 69 mg/dL (ref >39)
LDL CALC: 130 mg/dL — AB (ref 0–99)
TRIGLYCERIDES: 99 mg/dL (ref 0–149)
VLDL Cholesterol Cal: 20 mg/dL (ref 5–40)

## 2016-03-12 LAB — MAGNESIUM: Magnesium: 1.7 mg/dL (ref 1.6–2.3)

## 2016-03-13 ENCOUNTER — Ambulatory Visit: Payer: Managed Care, Other (non HMO) | Admitting: Family Medicine

## 2016-03-22 ENCOUNTER — Other Ambulatory Visit: Payer: Self-pay | Admitting: Family Medicine

## 2016-03-29 ENCOUNTER — Other Ambulatory Visit: Payer: Self-pay | Admitting: Family Medicine

## 2016-04-05 ENCOUNTER — Other Ambulatory Visit: Payer: Self-pay | Admitting: Family Medicine

## 2016-04-29 ENCOUNTER — Ambulatory Visit (INDEPENDENT_AMBULATORY_CARE_PROVIDER_SITE_OTHER): Payer: Managed Care, Other (non HMO) | Admitting: Family Medicine

## 2016-04-29 ENCOUNTER — Encounter: Payer: Self-pay | Admitting: Family Medicine

## 2016-04-29 VITALS — BP 126/78 | Ht 63.0 in | Wt 167.5 lb

## 2016-04-29 DIAGNOSIS — G4700 Insomnia, unspecified: Secondary | ICD-10-CM | POA: Diagnosis not present

## 2016-04-29 DIAGNOSIS — R7989 Other specified abnormal findings of blood chemistry: Secondary | ICD-10-CM | POA: Diagnosis not present

## 2016-04-29 DIAGNOSIS — I10 Essential (primary) hypertension: Secondary | ICD-10-CM

## 2016-04-29 DIAGNOSIS — F1010 Alcohol abuse, uncomplicated: Secondary | ICD-10-CM | POA: Diagnosis not present

## 2016-04-29 DIAGNOSIS — E785 Hyperlipidemia, unspecified: Secondary | ICD-10-CM | POA: Diagnosis not present

## 2016-04-29 DIAGNOSIS — K760 Fatty (change of) liver, not elsewhere classified: Secondary | ICD-10-CM | POA: Diagnosis not present

## 2016-04-29 DIAGNOSIS — R945 Abnormal results of liver function studies: Secondary | ICD-10-CM

## 2016-04-29 DIAGNOSIS — F1011 Alcohol abuse, in remission: Secondary | ICD-10-CM

## 2016-04-29 MED ORDER — HYDROXYZINE HCL 25 MG PO TABS: ORAL_TABLET | ORAL | 12 refills | Status: DC

## 2016-04-29 MED ORDER — CYCLOSPORINE 0.05 % OP EMUL: 1.0000 [drp] | Freq: Two times a day (BID) | OPHTHALMIC | 5 refills | Status: DC

## 2016-04-29 MED ORDER — ALBUTEROL SULFATE HFA 108 (90 BASE) MCG/ACT IN AERS: 2.0000 | INHALATION_SPRAY | Freq: Four times a day (QID) | RESPIRATORY_TRACT | 5 refills | Status: DC | PRN

## 2016-04-29 MED ORDER — LISINOPRIL-HYDROCHLOROTHIAZIDE 20-25 MG PO TABS: ORAL_TABLET | ORAL | 5 refills | Status: DC

## 2016-04-29 MED ORDER — POTASSIUM CHLORIDE CRYS ER 10 MEQ PO TBCR: EXTENDED_RELEASE_TABLET | ORAL | 12 refills | Status: DC

## 2016-04-29 MED ORDER — PANTOPRAZOLE SODIUM 40 MG PO TBEC: 40.0000 mg | DELAYED_RELEASE_TABLET | Freq: Every day | ORAL | 11 refills | Status: DC

## 2016-04-29 NOTE — Progress Notes (Signed)
   Subjective:    Patient ID: Julia [name], female    DOB: [birth year], 60 y.o.   MRN: [id number]  Hypertension  This is a chronic problem. The current episode started more than 1 year ago. There are no compliance problems.    patient states that she doesn't have much reflux as long as she watches her diet  she states her allergies under decent control States asthma under good control uses albuterol when necessary  patient states that she stays away from alcohol.  history fatty liver she is trying to watch diet lose weight Patient states no other concerns this visit.   patient denies smoking drinking. She states she's trying eat healthier and stay active. She works as a Marine scientist. Review of Systems  she denies chest tightness pressure pain shortness breath heartburn vomiting diarrhea bloody stools hematuria joint pain and rash fevers    Objective:   Physical Exam  HEENT benign neck no masses lungs are clear no crackles heart regular extremities no edema skin warm dry blood pressure good       Assessment & Plan:   insomnia uses hydroxyzine when necessary HTN doing well on current dose. Patient did state that occasionally she has low blood  Pressure when she loses weight. I told her if she does lose weight she needs to call back and we would reduce the dose  hyperlipidemia mild elevation watch diet exercise try to lose weight  Reactive airway under good control albuterol inhaler given  patient with history of borderline hypokalemic anemia along with diuretic use potassium twice daily as recommended  reflux under good control with diet does not use medication every day which I think is wise   mild obesity patient try and watch diet exercise Patient states her moods are doing well  mild elevation of liver functions recommend diet exercise losing weight recheck this again in 6 months it is still persistently elevated will need to do additional testing

## 2016-04-29 NOTE — Patient Instructions (Signed)
Dear Patient,  It has been recommended to you that you have a colonoscopy. It is your responsibility to carry through with this recommendation.   Did you realize that colon cancer is the second leading cancer killer in the [place]. One in every 20 adults will get colon cancer. If all adults would go through the recommended screening for colon cancer (getting a colonoscopy), then there would be a 60% reduction in the number of people dying from colon cancer.  Colon cancer just doesn't come out of the blue. It starts off as a small polyp which over time grows into a cancer. A colonoscopy can prevent cancer and in many cases detected when it is at a very treatable phase. Small colon cancers can have cure rates of 95%. Advanced colon cancer, which often occurs in people who do not do their screenings, have cure rates less than 20%. The risk of colon cancer advances with age. Most adults should have regular colonoscopies every 10 years starting at age 50. This recommendation can vary depending on a person's medical history.  Health-care laws now allow for you to call the gastroenterologist office directly in order to set yourself up for this very important tests. Today we have recommended to you that you do this test. This test may save your life. Failure to do this test puts you at risk for premature death from colon cancer. Do the right thing and schedule this test now.  Here as a list of specialists we recommend in the surrounding area. When you call their office let them know that you are a patient of our practice in your interested in doing a screening colonoscopy. They should assist you without problems. You will need the following information when you called them: 1-name of which Dr. you see, 2-your insurance information, 3-a list of medications that you currently take, 4-any allergies you have to medications.  [place] [facility]ologist Dr. Mike Rourk, Dr Sandi Fields   [place]  gastroenterologist   [phone]  Dr.Najeeb Rehman [facility]   [phone]  [facility] (Dr. Perry, N, Stark, Brodie, Gesner, Jacobs and Pyrtle) [phone]  [facility] (Dr. Buscemi, Edwards, Hayes, Maygod,Outlaw,Schooler) [phone]  Each group of specialists has assured us that when you called them they will help you get your colonoscopy set up. Should you have problems or if the GI practice insist a referral be done please let us know. Be sure to call soon. Sincerely, Carolyn Hoskins, Dr Steve Yeshaya Vath, Dr.Reida Hem    

## 2016-10-23 ENCOUNTER — Other Ambulatory Visit: Payer: Self-pay | Admitting: Family Medicine

## 2016-11-26 ENCOUNTER — Other Ambulatory Visit: Payer: Self-pay | Admitting: Family Medicine

## 2016-12-09 ENCOUNTER — Other Ambulatory Visit: Payer: Self-pay | Admitting: Family Medicine

## 2016-12-13 ENCOUNTER — Other Ambulatory Visit: Payer: Self-pay | Admitting: Family Medicine

## 2016-12-30 ENCOUNTER — Other Ambulatory Visit: Payer: Self-pay | Admitting: Family Medicine

## 2017-01-05 ENCOUNTER — Other Ambulatory Visit: Payer: Self-pay | Admitting: Family Medicine

## 2017-01-09 ENCOUNTER — Other Ambulatory Visit: Payer: Self-pay | Admitting: Family Medicine

## 2017-01-20 ENCOUNTER — Telehealth: Payer: Self-pay | Admitting: Family Medicine

## 2017-01-20 ENCOUNTER — Other Ambulatory Visit: Payer: Self-pay | Admitting: *Deleted

## 2017-01-20 MED ORDER — LISINOPRIL-HYDROCHLOROTHIAZIDE 20-25 MG PO TABS: 1.0000 | ORAL_TABLET | Freq: Every morning | ORAL | 0 refills | Status: DC

## 2017-01-20 NOTE — Telephone Encounter (Signed)
Refill sent. Pt notified.

## 2017-01-20 NOTE — Telephone Encounter (Signed)
Last seen for bp check up 9/27. Sent in #7 tablets on 01/11/17.

## 2017-01-20 NOTE — Telephone Encounter (Signed)
Patient has a follow up appointment on 03/03/17 with Dr. Nicki Reaper.  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  She is hoping we can send her in enough Lisinopril to [facility] [place] to last her.  Her husband will pick the medication up for her and bring it to her.

## 2017-01-20 NOTE — Telephone Encounter (Signed)
She may have a 90 day supply no refills

## 2017-03-03 ENCOUNTER — Other Ambulatory Visit: Payer: Self-pay | Admitting: Family Medicine

## 2017-03-03 ENCOUNTER — Encounter: Payer: Self-pay | Admitting: Family Medicine

## 2017-03-03 ENCOUNTER — Ambulatory Visit (INDEPENDENT_AMBULATORY_CARE_PROVIDER_SITE_OTHER): Payer: Managed Care, Other (non HMO) | Admitting: Family Medicine

## 2017-03-03 VITALS — BP 124/84 | Ht 63.0 in | Wt 167.0 lb

## 2017-03-03 DIAGNOSIS — I10 Essential (primary) hypertension: Secondary | ICD-10-CM | POA: Diagnosis not present

## 2017-03-03 DIAGNOSIS — E784 Other hyperlipidemia: Secondary | ICD-10-CM

## 2017-03-03 DIAGNOSIS — K219 Gastro-esophageal reflux disease without esophagitis: Secondary | ICD-10-CM

## 2017-03-03 DIAGNOSIS — Z1231 Encounter for screening mammogram for malignant neoplasm of breast: Secondary | ICD-10-CM

## 2017-03-03 DIAGNOSIS — K760 Fatty (change of) liver, not elsewhere classified: Secondary | ICD-10-CM | POA: Diagnosis not present

## 2017-03-03 DIAGNOSIS — G542 Cervical root disorders, not elsewhere classified: Secondary | ICD-10-CM | POA: Diagnosis not present

## 2017-03-03 DIAGNOSIS — E7849 Other hyperlipidemia: Secondary | ICD-10-CM

## 2017-03-03 MED ORDER — POTASSIUM CHLORIDE CRYS ER 10 MEQ PO TBCR: EXTENDED_RELEASE_TABLET | ORAL | 12 refills | Status: DC

## 2017-03-03 MED ORDER — LISINOPRIL-HYDROCHLOROTHIAZIDE 20-25 MG PO TABS: 1.0000 | ORAL_TABLET | Freq: Every morning | ORAL | 3 refills | Status: DC

## 2017-03-03 MED ORDER — PANTOPRAZOLE SODIUM 40 MG PO TBEC: 40.0000 mg | DELAYED_RELEASE_TABLET | Freq: Every day | ORAL | 11 refills | Status: DC

## 2017-03-03 NOTE — Progress Notes (Signed)
   Subjective:    Patient ID: Julia [name], female    DOB: [birth year], 61 y.o.   MRN: [id number]  Hypertension  This is a recurrent problem. The current episode started more than 1 year ago. The problem has been gradually improving since onset. Pertinent negatives include no chest pain, headaches or shortness of breath.   States she eats healthy and exercises. Patient relates she takes her blood pressure medicine regular basis Uses inhaler rarely She does take potassium as well She takes a medication to keep her reflux under control She know she has fatty liver she knows importance of trying to lose weight She will do her lab work in the near future Stiffness in left side of neck radiates from neck down shoulder in to the arm. Relates pain goes from less side the neck down into the trapezius down into the upper arm does not radiate to the hand no tingling or numbness with it no weakness with it. No other concerns.  Review of Systems  Constitutional: Negative for activity change, fatigue and fever.  Respiratory: Negative for cough and shortness of breath.   Cardiovascular: Negative for chest pain and leg swelling.  Neurological: Negative for headaches.       Objective:   Physical Exam  Constitutional: She appears well-nourished. No distress.  Cardiovascular: Normal rate, regular rhythm and normal heart sounds.   No murmur heard. Pulmonary/Chest: Effort normal and breath sounds normal. No respiratory distress.  Musculoskeletal: She exhibits no edema.  Lymphadenopathy:    She has no cervical adenopathy.  Neurological: She is alert. She exhibits normal muscle tone.  Psychiatric: Her behavior is normal.  Vitals reviewed.         Assessment & Plan:  Cervical nerve impingement currently right now patient using ibuprofen if he gets worse they will need to do imaging may need to do studies and possible advance medications no sign or weakness detected warning signs were  discussed  Fatty liver the importance of losing weight watching diet was discussed patient does not drink we will check lab work  Blood pressure good control continue current measures refills given  Reflux patient does have to use PPI to keep it under control continue current measures. Follow-up again in one years time  Patient denies smoking or drinking She denies being depressed currently

## 2017-03-04 LAB — BASIC METABOLIC PANEL
BUN/Creatinine Ratio: 13 (ref 9–23)
BUN: 10 mg/dL (ref 6–24)
CALCIUM: 9.8 mg/dL (ref 8.7–10.2)
CHLORIDE: 95 mmol/L — AB (ref 96–106)
CO2: 30 mmol/L — AB (ref 20–29)
Creatinine, Ser: 0.76 mg/dL (ref 0.57–1.00)
GFR calc Af Amer: 99 mL/min/{1.73_m2} (ref >59)
GFR calc non Af Amer: 86 mL/min/{1.73_m2} (ref >59)
GLUCOSE: 108 mg/dL — AB (ref 65–99)
POTASSIUM: 3.4 mmol/L — AB (ref 3.5–5.2)
Sodium: 139 mmol/L (ref 134–144)

## 2017-03-04 LAB — LIPID PANEL
CHOL/HDL RATIO: 3.4 ratio (ref 0.0–4.4)
Cholesterol, Total: 228 mg/dL — ABNORMAL HIGH (ref 100–199)
HDL: 68 mg/dL (ref >39)
LDL Calculated: 136 mg/dL — ABNORMAL HIGH (ref 0–99)
Triglycerides: 118 mg/dL (ref 0–149)
VLDL CHOLESTEROL CAL: 24 mg/dL (ref 5–40)

## 2017-03-04 LAB — HEPATIC FUNCTION PANEL
ALBUMIN: 4.6 g/dL (ref 3.5–5.5)
ALK PHOS: 62 IU/L (ref 39–117)
ALT: 43 IU/L — ABNORMAL HIGH (ref 0–32)
AST: 43 IU/L — ABNORMAL HIGH (ref 0–40)
Bilirubin Total: 0.5 mg/dL (ref 0.0–1.2)
Bilirubin, Direct: 0.14 mg/dL (ref 0.00–0.40)
Total Protein: 7.3 g/dL (ref 6.0–8.5)

## 2017-03-05 ENCOUNTER — Ambulatory Visit (HOSPITAL_COMMUNITY)
Admission: RE | Admit: 2017-03-05 | Discharge: 2017-03-05 | Disposition: A | Discharge status: Home / Self Care | Payer: Managed Care, Other (non HMO) | Source: Ambulatory Visit | Attending: Family Medicine | Admitting: Family Medicine

## 2017-03-05 ENCOUNTER — Ambulatory Visit (HOSPITAL_COMMUNITY): Payer: Self-pay

## 2017-03-05 ENCOUNTER — Encounter (HOSPITAL_COMMUNITY): Payer: Self-pay

## 2017-03-05 ENCOUNTER — Other Ambulatory Visit: Payer: Self-pay | Admitting: Family Medicine

## 2017-03-05 DIAGNOSIS — Z1231 Encounter for screening mammogram for malignant neoplasm of breast: Secondary | ICD-10-CM | POA: Diagnosis present

## 2017-03-08 ENCOUNTER — Other Ambulatory Visit: Payer: Self-pay | Admitting: *Deleted

## 2017-03-08 DIAGNOSIS — Z79899 Other long term (current) drug therapy: Secondary | ICD-10-CM

## 2017-04-17 ENCOUNTER — Other Ambulatory Visit: Payer: Self-pay | Admitting: Family Medicine

## 2017-07-23 ENCOUNTER — Ambulatory Visit: Payer: Managed Care, Other (non HMO) | Admitting: Family Medicine

## 2017-11-28 ENCOUNTER — Other Ambulatory Visit: Payer: Self-pay | Admitting: Family Medicine

## 2018-02-05 ENCOUNTER — Other Ambulatory Visit: Payer: Self-pay | Admitting: Family Medicine

## 2018-03-13 ENCOUNTER — Other Ambulatory Visit: Payer: Self-pay | Admitting: Family Medicine

## 2018-03-31 ENCOUNTER — Telehealth: Payer: Self-pay | Admitting: Family Medicine

## 2018-03-31 DIAGNOSIS — I10 Essential (primary) hypertension: Secondary | ICD-10-CM

## 2018-03-31 DIAGNOSIS — R748 Abnormal levels of other serum enzymes: Secondary | ICD-10-CM

## 2018-03-31 DIAGNOSIS — E7849 Other hyperlipidemia: Secondary | ICD-10-CM

## 2018-03-31 DIAGNOSIS — Z79899 Other long term (current) drug therapy: Secondary | ICD-10-CM

## 2018-03-31 DIAGNOSIS — Z114 Encounter for screening for human immunodeficiency virus [HIV]: Secondary | ICD-10-CM

## 2018-03-31 NOTE — Telephone Encounter (Signed)
Coming in for BP check on 04-13-18 and wants to have any labs done prior to appt.  Let her know if Dr. Nicki Reaper puts in an order for labwork at [facility].

## 2018-03-31 NOTE — Telephone Encounter (Signed)
Last labs on 03/03/18 BMP, hepatic, and lipid.

## 2018-04-01 NOTE — Telephone Encounter (Signed)
I rec lipid,liver,met7 Rea: hyperlip,elev liver enzymes, htn Also HIV Ab per CDC guidelines- tell her this is a one time test rec for all patients

## 2018-04-01 NOTE — Telephone Encounter (Signed)
I called and left a message asked that she please return call. I did not place the lab orders as of yet because she may not want the HIV AB done. I will ask her permission and then order all.

## 2018-04-05 NOTE — Telephone Encounter (Signed)
Patient is aware of all.Agrees to have the HIV and the orders have been placed.

## 2018-04-05 NOTE — Telephone Encounter (Signed)
Left message to return call 

## 2018-04-13 ENCOUNTER — Encounter: Payer: Self-pay | Admitting: Family Medicine

## 2018-04-13 ENCOUNTER — Ambulatory Visit (INDEPENDENT_AMBULATORY_CARE_PROVIDER_SITE_OTHER): Payer: 59 | Admitting: Family Medicine

## 2018-04-13 VITALS — BP 118/72 | Ht 63.0 in | Wt 174.8 lb

## 2018-04-13 DIAGNOSIS — I10 Essential (primary) hypertension: Secondary | ICD-10-CM | POA: Diagnosis not present

## 2018-04-13 DIAGNOSIS — Z23 Encounter for immunization: Secondary | ICD-10-CM

## 2018-04-13 DIAGNOSIS — E7849 Other hyperlipidemia: Secondary | ICD-10-CM | POA: Diagnosis not present

## 2018-04-13 DIAGNOSIS — K219 Gastro-esophageal reflux disease without esophagitis: Secondary | ICD-10-CM

## 2018-04-13 MED ORDER — PREDNISONE 20 MG PO TABS: ORAL_TABLET | ORAL | 0 refills | Status: DC

## 2018-04-13 MED ORDER — GABAPENTIN 100 MG PO CAPS: 100.0000 mg | ORAL_CAPSULE | Freq: Three times a day (TID) | ORAL | 2 refills | Status: DC

## 2018-04-13 NOTE — Progress Notes (Signed)
   Subjective:    Patient ID: Julia [name], female    DOB: January 01, 1956, 62 y.o.   MRN: [id number]  Hypertension  This is a chronic problem. Pertinent negatives include no chest pain or headaches. Compliance problems include exercise (takes meds every day, eats healthy, not able to exercise due to back pain).    Spasms in between shoulder blades. Back pain radiates down left leg. Started about 2 weeks ago. Taking ibuprofen. Not helping.  She relates low back pain radiates down the left leg hurts with certain movements.  That is her main reason for coming today hurts with certain activities Keeps her awake at night Denies weakness Able to walk  Flu vaccine given today.   Review of Systems  Constitutional: Negative for activity change, appetite change and fatigue.  HENT: Negative for congestion.   Respiratory: Negative for cough.   Cardiovascular: Negative for chest pain.  Gastrointestinal: Negative for abdominal pain.  Musculoskeletal: Positive for back pain. Negative for gait problem.  Skin: Negative for color change.  Neurological: Negative for headaches.  Psychiatric/Behavioral: Negative for behavioral problems.       Objective:   Physical Exam  Constitutional: She appears well-nourished. No distress.  HENT:  Head: Normocephalic and atraumatic.  Eyes: Right eye exhibits no discharge. Left eye exhibits no discharge.  Neck: No tracheal deviation present.  Cardiovascular: Normal rate, regular rhythm and normal heart sounds.  No murmur heard. Pulmonary/Chest: Effort normal and breath sounds normal. No respiratory distress.  Musculoskeletal: She exhibits no edema.  Lymphadenopathy:    She has no cervical adenopathy.  Neurological: She is alert. Coordination normal.  Skin: Skin is warm and dry.  Psychiatric: She has a normal mood and affect. Her behavior is normal.  Vitals reviewed.  Low back subjective discomfort positive straight leg raise on the left fair range of motion able  to raise both big toes reflexes equally bilateral able to walk on her toes and on her heels       Assessment & Plan:  Blood pressure good control continue current measures  Sciatica stretches shown short course prednisone may use gabapentin gradually titrate up to 100 mg 3 times a day should gradually get better over time 85% will get much better with 8 weeks follow-up in a couple months if ongoing troubles may need MRI follow-up sooner problems call sooner problems

## 2018-04-13 NOTE — Patient Instructions (Signed)
Sciatica Sciatica is pain, numbness, weakness, or tingling along the path of the sciatic nerve. The sciatic nerve starts in the lower back and runs down the back of each leg. The nerve controls the muscles in the lower leg and in the back of the knee. It also provides feeling (sensation) to the back of the thigh, the lower leg, and the sole of the foot. Sciatica is a symptom of another medical condition that pinches or puts pressure on the sciatic nerve. Generally, sciatica only affects one side of the body. Sciatica usually goes away on its own or with treatment. In some cases, sciatica may keep coming back (recur). What are the causes? This condition is caused by pressure on the sciatic nerve, or pinching of the sciatic nerve. This may be the result of:  A disk in between the bones of the spine (vertebrae) bulging out too far (herniated disk).  Age-related changes in the spinal disks (degenerative disk disease).  A pain disorder that affects a muscle in the buttock (piriformis syndrome).  Extra bone growth (bone spur) near the sciatic nerve.  An injury or break (fracture) of the pelvis.  Pregnancy.  Tumor (rare).  What increases the risk? The following factors may make you more likely to develop this condition:  Playing sports that place pressure or stress on the spine, such as football or weight lifting.  Having poor strength and flexibility.  A history of back injury.  A history of back surgery.  Sitting for long periods of time.  Doing activities that involve repetitive bending or lifting.  Obesity.  What are the signs or symptoms? Symptoms can vary from mild to very severe, and they may include:  Any of these problems in the lower back, leg, hip, or buttock: ? Mild tingling or dull aches. ? Burning sensations. ? Sharp pains.  Numbness in the back of the calf or the sole of the foot.  Leg weakness.  Severe back pain that makes movement difficult.  These  symptoms may get worse when you cough, sneeze, or laugh, or when you sit or stand for long periods of time. Being overweight may also make symptoms worse. In some cases, symptoms may recur over time. How is this diagnosed? This condition may be diagnosed based on:  Your symptoms.  A physical exam. Your health care provider may ask you to do certain movements to check whether those movements trigger your symptoms.  You may have tests, including: ? Blood tests. ? X-rays. ? MRI. ? CT scan.  How is this treated? In many cases, this condition improves on its own, without any treatment. However, treatment may include:  Reducing or modifying physical activity during periods of pain.  Exercising and stretching to strengthen your abdomen and improve the flexibility of your spine.  Icing and applying heat to the affected area.  Medicines that help: ? To relieve pain and swelling. ? To relax your muscles.  Injections of medicines that help to relieve pain, irritation, and inflammation around the sciatic nerve (steroids).  Surgery.  Follow these instructions at home: Medicines  Take over-the-counter and prescription medicines only as told by your health care provider.  Do not drive or operate heavy machinery while taking prescription pain medicine. Managing pain  If directed, apply ice to the affected area. ? Put ice in a plastic bag. ? Place a towel between your skin and the bag. ? Leave the ice on for 20 minutes, 2-3 times a day.  After icing, apply   heat to the affected area before you exercise or as often as told by your health care provider. Use the heat source that your health care provider recommends, such as a moist heat pack or a heating pad. ? Place a towel between your skin and the heat source. ? Leave the heat on for 20-30 minutes. ? Remove the heat if your skin turns bright red. This is especially important if you are unable to feel pain, heat, or cold. You may have a  greater risk of getting burned. Activity  Return to your normal activities as told by your health care provider. Ask your health care provider what activities are safe for you. ? Avoid activities that make your symptoms worse.  Take brief periods of rest throughout the day. Resting in a lying or standing position is usually better than sitting to rest. ? When you rest for longer periods, mix in some mild activity or stretching between periods of rest. This will help to prevent stiffness and pain. ? Avoid sitting for long periods of time without moving. Get up and move around at least one time each hour.  Exercise and stretch regularly, as told by your health care provider.  Do not lift anything that is heavier than 10 lb (4.5 kg) while you have symptoms of sciatica. When you do not have symptoms, you should still avoid heavy lifting, especially repetitive heavy lifting.  When you lift objects, always use proper lifting technique, which includes: ? Bending your knees. ? Keeping the load close to your body. ? Avoiding twisting. General instructions  Use good posture. ? Avoid leaning forward while sitting. ? Avoid hunching over while standing.  Maintain a healthy weight. Excess weight puts extra stress on your back and makes it difficult to maintain good posture.  Wear supportive, comfortable shoes. Avoid wearing high heels.  Avoid sleeping on a mattress that is too soft or too hard. A mattress that is firm enough to support your back when you sleep may help to reduce your pain.  Keep all follow-up visits as told by your health care provider. This is important. Contact a health care provider if:  You have pain that wakes you up when you are sleeping.  You have pain that gets worse when you lie down.  Your pain is worse than you have experienced in the past.  Your pain lasts longer than 4 weeks.  You experience unexplained weight loss. Get help right away if:  You lose control  of your bowel or bladder (incontinence).  You have: ? Weakness in your lower back, pelvis, buttocks, or legs that gets worse. ? Redness or swelling of your back. ? A burning sensation when you urinate. This information is not intended to replace advice given to you by your health care provider. Make sure you discuss any questions you have with your health care provider. Document Released: 07/14/2001 Document Revised: 12/24/2015 Document Reviewed: 03/29/2015 Elsevier Interactive Patient Education  2018 [employer]  

## 2018-04-14 LAB — BASIC METABOLIC PANEL
BUN/Creatinine Ratio: 11 — ABNORMAL LOW (ref 12–28)
BUN: 11 mg/dL (ref 8–27)
CO2: 27 mmol/L (ref 20–29)
CREATININE: 1.02 mg/dL — AB (ref 0.57–1.00)
Calcium: 10.5 mg/dL — ABNORMAL HIGH (ref 8.7–10.3)
Chloride: 94 mmol/L — ABNORMAL LOW (ref 96–106)
GFR calc non Af Amer: 60 mL/min/{1.73_m2} (ref >59)
GFR, EST AFRICAN AMERICAN: 69 mL/min/{1.73_m2} (ref >59)
Glucose: 106 mg/dL — ABNORMAL HIGH (ref 65–99)
Potassium: 3.1 mmol/L — ABNORMAL LOW (ref 3.5–5.2)
SODIUM: 139 mmol/L (ref 134–144)

## 2018-04-14 LAB — LIPID PANEL
CHOL/HDL RATIO: 3.4 ratio (ref 0.0–4.4)
Cholesterol, Total: 212 mg/dL — ABNORMAL HIGH (ref 100–199)
HDL: 63 mg/dL (ref >39)
LDL Calculated: 131 mg/dL — ABNORMAL HIGH (ref 0–99)
TRIGLYCERIDES: 88 mg/dL (ref 0–149)
VLDL Cholesterol Cal: 18 mg/dL (ref 5–40)

## 2018-04-14 LAB — HEPATIC FUNCTION PANEL
ALK PHOS: 66 IU/L (ref 39–117)
ALT: 68 IU/L — ABNORMAL HIGH (ref 0–32)
AST: 52 IU/L — ABNORMAL HIGH (ref 0–40)
Albumin: 4.6 g/dL (ref 3.6–4.8)
BILIRUBIN TOTAL: 0.4 mg/dL (ref 0.0–1.2)
BILIRUBIN, DIRECT: 0.12 mg/dL (ref 0.00–0.40)
TOTAL PROTEIN: 7.3 g/dL (ref 6.0–8.5)

## 2018-04-14 LAB — HIV ANTIBODY (ROUTINE TESTING W REFLEX): HIV Screen 4th Generation wRfx: NONREACTIVE

## 2018-04-14 NOTE — Addendum Note (Signed)
Addended by: Karle Barr on: 04/14/2018 04:00 PM   Modules accepted: Orders

## 2018-04-15 ENCOUNTER — Other Ambulatory Visit: Payer: Self-pay

## 2018-04-15 MED ORDER — LISINOPRIL-HYDROCHLOROTHIAZIDE 20-25 MG PO TABS: 1.0000 | ORAL_TABLET | Freq: Every morning | ORAL | 3 refills | Status: DC

## 2018-04-15 NOTE — Addendum Note (Signed)
Addended by: Karle Barr on: 04/15/2018 10:31 AM   Modules accepted: Orders

## 2018-04-20 ENCOUNTER — Other Ambulatory Visit: Payer: Self-pay | Admitting: Family Medicine

## 2018-04-20 DIAGNOSIS — Z1231 Encounter for screening mammogram for malignant neoplasm of breast: Secondary | ICD-10-CM

## 2018-04-22 ENCOUNTER — Encounter (HOSPITAL_COMMUNITY): Payer: Self-pay

## 2018-04-22 ENCOUNTER — Ambulatory Visit (HOSPITAL_COMMUNITY)
Admission: RE | Admit: 2018-04-22 | Discharge: 2018-04-22 | Disposition: A | Discharge status: Home / Self Care | Payer: 59 | Source: Ambulatory Visit | Attending: Family Medicine | Admitting: Family Medicine

## 2018-04-22 DIAGNOSIS — K760 Fatty (change of) liver, not elsewhere classified: Secondary | ICD-10-CM | POA: Diagnosis not present

## 2018-04-22 DIAGNOSIS — Z1231 Encounter for screening mammogram for malignant neoplasm of breast: Secondary | ICD-10-CM | POA: Diagnosis present

## 2018-04-22 DIAGNOSIS — R748 Abnormal levels of other serum enzymes: Secondary | ICD-10-CM

## 2018-04-27 ENCOUNTER — Other Ambulatory Visit: Payer: Self-pay | Admitting: Family Medicine

## 2018-07-12 ENCOUNTER — Other Ambulatory Visit: Payer: Self-pay | Admitting: Family Medicine

## 2018-07-13 ENCOUNTER — Ambulatory Visit: Payer: 59 | Admitting: Family Medicine

## 2018-08-09 ENCOUNTER — Ambulatory Visit (INDEPENDENT_AMBULATORY_CARE_PROVIDER_SITE_OTHER): Payer: 59 | Admitting: Family Medicine

## 2018-08-09 ENCOUNTER — Encounter: Payer: Self-pay | Admitting: Family Medicine

## 2018-08-09 VITALS — BP 128/74 | Temp 98.3°F | Ht 63.0 in | Wt 177.8 lb

## 2018-08-09 DIAGNOSIS — R300 Dysuria: Secondary | ICD-10-CM | POA: Diagnosis not present

## 2018-08-09 DIAGNOSIS — N3000 Acute cystitis without hematuria: Secondary | ICD-10-CM | POA: Diagnosis not present

## 2018-08-09 LAB — POCT URINALYSIS DIPSTICK
Spec Grav, UA: 1.02 (ref 1.010–1.025)
pH, UA: 5 (ref 5.0–8.0)

## 2018-08-09 MED ORDER — CIPROFLOXACIN HCL 500 MG PO TABS: 500.0000 mg | ORAL_TABLET | Freq: Two times a day (BID) | ORAL | 0 refills | Status: AC

## 2018-08-09 NOTE — Progress Notes (Signed)
   Subjective:    Patient ID: Julia [name], female    DOB: 08/11/1955, 63 y.o.   MRN: [id number]  Urinary Tract Infection   This is a new problem. Episode onset: one week. Associated symptoms include chills, frequency and nausea. Pertinent negatives include no flank pain, hematuria or vomiting. Associated symptoms comments: Burning with urination, frequent urination, low abdominal pain, joint pain that she had in the past when she had UTI. Treatments tried: increased water intake, cranapple juice.   Started getting worse wabout 1 week ago. Has been taking ibuprofen for joint pain so unsure of fever, reports chills occasionally, some nausea. Reports bilateral low back pain, has been worse since symptoms started. Denies hematuria. Denies vaginal discharge or itching.   Results for orders placed or performed in visit on 08/09/18  POCT urinalysis dipstick  Result Value Ref Range   Color, UA     Clarity, UA     Glucose, UA     Bilirubin, UA ++    Ketones, UA     Spec Grav, UA 1.020 1.010 - 1.025   Blood, UA     pH, UA 5.0 5.0 - 8.0   Protein, UA     Urobilinogen, UA     Nitrite, UA     Leukocytes, UA Trace (A) Negative   Appearance     Odor        Review of Systems  Constitutional: Positive for chills. Negative for fever.  Gastrointestinal: Positive for abdominal pain and nausea. Negative for diarrhea and vomiting.  Genitourinary: Positive for dysuria and frequency. Negative for flank pain, hematuria, vaginal bleeding and vaginal discharge.       Objective:   Physical Exam Vitals signs and nursing note reviewed.  Constitutional:      General: She is not in acute distress.    Appearance: Normal appearance. She is not toxic-appearing.  HENT:     Head: Normocephalic and atraumatic.  Neck:     Musculoskeletal: Neck supple. No neck rigidity.  Cardiovascular:     Rate and Rhythm: Normal rate and regular rhythm.     Heart sounds: Normal heart sounds.  Pulmonary:     Effort:  Pulmonary effort is normal. No respiratory distress.     Breath sounds: Normal breath sounds.  Abdominal:     General: Bowel sounds are normal. There is no distension.     Palpations: Abdomen is soft. There is no mass.     Tenderness: There is abdominal tenderness in the suprapubic area. There is no right CVA tenderness or left CVA tenderness.  Lymphadenopathy:     Cervical: No cervical adenopathy.  Skin:    General: Skin is warm and dry.  Neurological:     Mental Status: She is alert and oriented to person, place, and time.  Psychiatric:        Mood and Affect: Mood normal.           Assessment & Plan:  Acute cystitis without hematuria  Dysuria - Plan: POCT urinalysis dipstick, Urine Culture  Will send urine for culture. Likely UTI, possible early pyelonephritis. Will treat empirically with cipro 500 mg bid x 7 days. Warning signs discussed. F/u if symptoms worsen or fail to improve.

## 2018-08-14 LAB — URINE CULTURE

## 2018-08-14 LAB — SPECIMEN STATUS REPORT

## 2018-08-17 MED ORDER — DOXYCYCLINE HYCLATE 100 MG PO TABS: 100.0000 mg | ORAL_TABLET | Freq: Two times a day (BID) | ORAL | 0 refills | Status: DC

## 2018-08-17 NOTE — Addendum Note (Signed)
Addended by: Dairl Ponder on: 08/17/2018 04:57 PM   Modules accepted: Orders

## 2018-09-08 ENCOUNTER — Other Ambulatory Visit: Payer: Self-pay | Admitting: Family Medicine

## 2018-09-10 ENCOUNTER — Other Ambulatory Visit: Payer: Self-pay | Admitting: Family Medicine

## 2018-11-02 DIAGNOSIS — M545 Low back pain, unspecified: Secondary | ICD-10-CM

## 2018-11-02 DIAGNOSIS — G8929 Other chronic pain: Secondary | ICD-10-CM

## 2018-11-02 HISTORY — DX: Low back pain, unspecified: M54.50

## 2018-11-02 HISTORY — DX: Other chronic pain: G89.29

## 2018-11-24 ENCOUNTER — Other Ambulatory Visit: Payer: Self-pay

## 2018-11-24 ENCOUNTER — Encounter (INDEPENDENT_AMBULATORY_CARE_PROVIDER_SITE_OTHER): Payer: Self-pay | Admitting: Orthopaedic Surgery

## 2018-11-24 ENCOUNTER — Ambulatory Visit (INDEPENDENT_AMBULATORY_CARE_PROVIDER_SITE_OTHER): Payer: 59

## 2018-11-24 ENCOUNTER — Ambulatory Visit (INDEPENDENT_AMBULATORY_CARE_PROVIDER_SITE_OTHER): Payer: 59 | Admitting: Orthopaedic Surgery

## 2018-11-24 VITALS — Ht 62.0 in | Wt 162.0 lb

## 2018-11-24 DIAGNOSIS — G8929 Other chronic pain: Secondary | ICD-10-CM

## 2018-11-24 DIAGNOSIS — M545 Low back pain, unspecified: Secondary | ICD-10-CM

## 2018-11-24 DIAGNOSIS — M542 Cervicalgia: Secondary | ICD-10-CM | POA: Diagnosis not present

## 2018-11-24 NOTE — Addendum Note (Signed)
Addended by: Meyer Cory on: 11/24/2018 11:50 AM   Modules accepted: Orders

## 2018-11-24 NOTE — Progress Notes (Signed)
Office Visit Note   Patient: Julia [name]           Date of Birth: 1955-09-09           MRN: [id number] Visit Date: 11/24/2018              Requested by: Kathyrn Drown, MD [address] [place], [place] [address] PCP: Kathyrn Drown, MD   Assessment & Plan: Visit Diagnoses:  1. Neck pain   2. Chronic bilateral low back pain, unspecified whether sciatica present     Plan: We will set up for some formal some physical therapy in [place] outpatient.  We will check her back again in 6 weeks.  She is having persistent problems will consider diagnostic imaging lumbar spine with MRI scan.  Follow-Up Instructions: No follow-ups on file.   Orders:  Orders Placed This Encounter  Procedures  . XR Cervical Spine 2 or 3 views  . XR Lumbar Spine 2-3 Views   No orders of the defined types were placed in this encounter.     Procedures: No procedures performed   Clinical Data: No additional findings.   Subjective: Chief Complaint  Patient presents with  . Neck - Pain  . 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  She is seeing Dr. Wolfgang Phoenix has been on home exercise program without improvement.  She is taking gabapentin 1 3 times daily but mostly just takes 100 mg at night since it makes her feel loopy.  Past history of alcoholism recovered x7 years.  She took prednisone made her somewhat hypersensitive could not sleep.  She is continued to have pain with activities turning twisting.  She denies associated bowel bladder symptoms.  She does have some associated neck pain but low back pain is worse at the lumbosacral junction radiates into her buttocks worse on the left than right sometimes radiates into the thigh.  Review of Systems positive for hypertension GERD history of alcohol disorder, abnormal LFTs, substance abuse mood disorder, insomnia, fatty liver, hyperlipidemia, chronic  low back pain otherwise negative pertains HPI.   Objective: Vital Signs: Ht 5\' 2"  (1.575 m)   Wt 162 lb (73.5 kg)   BMI 29.63 kg/m   Physical Exam Constitutional:      Appearance: She is well-developed.  HENT:     Head: Normocephalic.     Right Ear: External ear normal.     Left Ear: External ear normal.  Eyes:     Pupils: Pupils are equal, round, and reactive to light.  Neck:     Thyroid: No thyromegaly.     Trachea: No tracheal deviation.  Cardiovascular:     Rate and Rhythm: Normal rate.  Pulmonary:     Effort: Pulmonary effort is normal.  Abdominal:     Palpations: Abdomen is soft.  Skin:    General: Skin is warm and dry.  Neurological:     Mental Status: She is alert and oriented to person, place, and time.  Psychiatric:        Behavior: Behavior normal.     Ortho Exam pain with straight leg raising 90 degrees on the left negative to 90 degrees on the right.  Knee and ankle jerk are 1+ and symmetrical negative logroll of the hips no pain with hip flexion good quad strength.  Mild tenderness trochanteric bursa on the left mild  sciatic notch tenderness some tenderness palpation of the lumbosacral junction.  Skin of the lumbar spine is normal.  She is slow getting from sitting to standing with walking.  Anterior tib gastrocsoleus is intact no calf atrophy.  Distal pulses are intact. Specialty Comments:  No specialty comments available.  Imaging: No results found.   PMFS History: Patient Active Problem List   Diagnosis Date Noted  . History of alcohol abuse 04/29/2016  . Fatty liver 04/29/2016  . Hyperlipidemia 04/29/2016  . Insomnia 08/28/2014  . 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 Substance induced mood disorder ([place]) 09/13/2013  . Severe recurrent major depression with psychotic features, 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[place]) 09/13/2013  . HTN (hypertension) 03/06/2011  . GERD (gastroesophageal reflux disease)  03/06/2011  . Abnormal LFTs 03/06/2011   Past Medical History:  Diagnosis Date  . Anxiety disorder   . Depression, major    Suicide attempt w/ injection of KCL  . ETOH abuse   . GERD (gastroesophageal reflux disease)   . Hypertension   . Meningitis   . Obesity     Family History  Problem Relation Age of Onset  . Cancer Mother        Breast  . Hypertension Father     Past Surgical History:  Procedure Laterality Date  . APPENDECTOMY    . BREAST SURGERY     biopsy  . COLON SURGERY     pylops   Social History   Occupational History  . Not on file  Tobacco Use  . Smoking status: Former Smoker    Packs/day: 1.50    Years: 2.00    Pack years: 3.00    Types: Cigarettes  . Smokeless tobacco: Never Used  Substance and Sexual Activity  . Alcohol use: Yes    Alcohol/week: 0.0 standard drinks    Comment: twice montly 1 pint total  . Drug use: No  . Sexual activity: Not Currently

## 2018-11-25 ENCOUNTER — Telehealth (INDEPENDENT_AMBULATORY_CARE_PROVIDER_SITE_OTHER): Payer: Self-pay | Admitting: Radiology

## 2018-11-25 ENCOUNTER — Telehealth (HOSPITAL_COMMUNITY): Payer: Self-pay | Admitting: Physical Therapy

## 2018-11-25 ENCOUNTER — Ambulatory Visit (HOSPITAL_COMMUNITY): Payer: 59 | Admitting: Physical Therapy

## 2018-11-25 NOTE — Telephone Encounter (Signed)
Patient called to cx she has a sore thorat today and did not want to r/s - I told her we would call again to r/s next week and check on how she is feeling

## 2018-11-25 NOTE — Telephone Encounter (Signed)
Please see below message from [place] with [facility] and advise. She sent this in a staff message to me.          I have already spoke with Ms. Mattes but she asked me to let you know that our office is closed right now and are looking to re-open 6/1 or earlier. We had a PT look at her referral and chart to see if she should be a priority patient to go ahead and see in the office or for Telehealth visits and the therapist stated that she wasn't a priority and so we would schedule once the office re-opened.   We are only seeing wound, lymphedema, recent surgical patients or recent CVA patients for now on a M, W, F schedule, just FYI.

## 2019-01-03 ENCOUNTER — Other Ambulatory Visit: Payer: Self-pay | Admitting: Family Medicine

## 2019-01-03 NOTE — Telephone Encounter (Signed)
May have this +1 refill needs follow-up visit this summer

## 2019-01-05 ENCOUNTER — Ambulatory Visit: Payer: 59 | Admitting: Orthopaedic Surgery

## 2019-01-16 ENCOUNTER — Other Ambulatory Visit: Payer: Self-pay | Admitting: Family Medicine

## 2019-01-16 NOTE — Telephone Encounter (Signed)
May have this +2 refills Needs follow-up visit in person or virtual this summer

## 2019-03-10 ENCOUNTER — Other Ambulatory Visit: Payer: Self-pay | Admitting: Family Medicine

## 2019-03-12 NOTE — Telephone Encounter (Signed)
Patient may have 1 month on refills Needs virtual visit within the next month or in person

## 2019-04-09 ENCOUNTER — Other Ambulatory Visit: Payer: Self-pay | Admitting: Family Medicine

## 2019-04-11 NOTE — Telephone Encounter (Signed)
1 month refill needs office visit or virtual

## 2019-04-12 NOTE — Telephone Encounter (Signed)
Patient scheduled an appt for 04-18-19 but is also out of hydroxyzine that she uses occasionally to help her sleep.   [facility] [address]

## 2019-04-12 NOTE — Telephone Encounter (Signed)
Please schedule and then send back so we can send in refill

## 2019-04-12 NOTE — Telephone Encounter (Signed)
Left message to schedule appointment

## 2019-04-18 ENCOUNTER — Other Ambulatory Visit: Payer: Self-pay

## 2019-04-18 ENCOUNTER — Ambulatory Visit (INDEPENDENT_AMBULATORY_CARE_PROVIDER_SITE_OTHER): Payer: 59 | Admitting: Family Medicine

## 2019-04-18 VITALS — BP 132/76

## 2019-04-18 DIAGNOSIS — I10 Essential (primary) hypertension: Secondary | ICD-10-CM

## 2019-04-18 DIAGNOSIS — K760 Fatty (change of) liver, not elsewhere classified: Secondary | ICD-10-CM

## 2019-04-18 DIAGNOSIS — E7849 Other hyperlipidemia: Secondary | ICD-10-CM

## 2019-04-18 DIAGNOSIS — G4709 Other insomnia: Secondary | ICD-10-CM

## 2019-04-18 MED ORDER — POTASSIUM CHLORIDE CRYS ER 10 MEQ PO TBCR: 10.0000 meq | EXTENDED_RELEASE_TABLET | Freq: Two times a day (BID) | ORAL | 1 refills | Status: DC

## 2019-04-18 MED ORDER — HYDROXYZINE HCL 25 MG PO TABS: ORAL_TABLET | ORAL | 1 refills | Status: DC

## 2019-04-18 MED ORDER — LISINOPRIL-HYDROCHLOROTHIAZIDE 20-25 MG PO TABS: 1.0000 | ORAL_TABLET | Freq: Every morning | ORAL | 3 refills | Status: DC

## 2019-04-18 MED ORDER — PANTOPRAZOLE SODIUM 40 MG PO TBEC: DELAYED_RELEASE_TABLET | ORAL | 1 refills | Status: DC

## 2019-04-18 NOTE — Progress Notes (Signed)
   Subjective:    Patient ID: Julia [name], female    DOB: 1955/10/23, 63 y.o.   MRN: [id number]  Pt states bp 132/76.  Hypertension This is a chronic problem. Pertinent negatives include no chest pain or shortness of breath. Compliance problems: tries to eat healthy, exercises and takes meds every day.    Did go see ortho for back pain and was told she had some bone spurs. Doing some exercises and is doing better and she also found out she had a kidney stone. Kidney stone not causing any problems.  Patient does not feel the kidney stones are bothering Patient does have a history of elevated liver enzymes is willing to do her blood work She does states she uses her PPI intermittently for reflux illness She states blood pressure is been under good control lately. Needs refill on hydroxyzine.   Virtual Visit via Telephone Note  I connected with Julia [name] on 04/18/19 at 11:00 AM EDT by telephone and verified that I am speaking with the correct person using two identifiers.  Location: Patient: home Provider: office   I discussed the limitations, risks, security and privacy concerns of performing an evaluation and management service by telephone and the availability of in person appointments. I also discussed with the patient that there may be a patient responsible charge related to this service. The patient expressed understanding and agreed to proceed.   History of Present Illness:    Observations/Objective:   Assessment and Plan:   Follow Up Instructions:    I discussed the assessment and treatment plan with the patient. The patient was provided an opportunity to ask questions and all were answered. The patient agreed with the plan and demonstrated an understanding of the instructions.   The patient was advised to call back or seek an in-person evaluation if the symptoms worsen or if the condition fails to improve as anticipated.  I provided 15 minutes of non-face-to-face  time during this encounter.      Review of Systems  Constitutional: Negative for activity change, appetite change and fatigue.  HENT: Negative for congestion and rhinorrhea.   Respiratory: Negative for cough and shortness of breath.   Cardiovascular: Negative for chest pain and leg swelling.  Gastrointestinal: Negative for abdominal pain and diarrhea.  Endocrine: Negative for polydipsia and polyphagia.  Skin: Negative for color change.  Neurological: Negative for dizziness and weakness.  Psychiatric/Behavioral: Negative for behavioral problems and confusion.       Objective:   Physical Exam  Today's visit was via telephone Physical exam was not possible for this visit       Assessment & Plan:  HTN good control continue current measures PPI use intermittently for GERD Elevated liver enzymes do lab work watch diet Follow-up 6 months follow-up sooner problems

## 2019-05-11 ENCOUNTER — Other Ambulatory Visit: Payer: Self-pay | Admitting: Family Medicine

## 2019-05-17 ENCOUNTER — Other Ambulatory Visit: Payer: Self-pay | Admitting: Family Medicine

## 2019-05-18 NOTE — Telephone Encounter (Signed)
90 with 1 refill each

## 2019-07-16 ENCOUNTER — Other Ambulatory Visit: Payer: Self-pay | Admitting: Family Medicine

## 2019-09-28 ENCOUNTER — Ambulatory Visit (INDEPENDENT_AMBULATORY_CARE_PROVIDER_SITE_OTHER): Payer: 59 | Admitting: Family Medicine

## 2019-09-28 ENCOUNTER — Ambulatory Visit: Payer: 59 | Attending: Internal Medicine

## 2019-09-28 ENCOUNTER — Other Ambulatory Visit: Payer: Self-pay

## 2019-09-28 DIAGNOSIS — Z20822 Contact with and (suspected) exposure to covid-19: Secondary | ICD-10-CM

## 2019-09-28 DIAGNOSIS — B349 Viral infection, unspecified: Secondary | ICD-10-CM

## 2019-09-28 DIAGNOSIS — R110 Nausea: Secondary | ICD-10-CM

## 2019-09-28 MED ORDER — ONDANSETRON 4 MG PO TBDP: 4.0000 mg | ORAL_TABLET | Freq: Four times a day (QID) | ORAL | 0 refills | Status: DC | PRN

## 2019-09-28 NOTE — Progress Notes (Signed)
   Subjective:  Audio plus video  Patient ID: Julia [name], female    DOB: 06/22/1956, 64 y.o.   MRN: [id number]  Sinusitis This is a new problem. Episode onset: 2 days.  started feeling run down 2 days ago then yesterday cough, drainage from nose, vomiting, joint pain and feeling achy, burning sensation in chest, diarrhea all last night.   Pt had uti  Took macrobid  wearimg mask  tue started feelimg queazzy  vomitiny yest   Cough quite a bit  And throat soe and achey Virtual Visit via Telephone Note  I connected with Stefanie Libel on 09/28/19 at 11:00 AM EST by telephone and verified that I am speaking with the correct person using two identifiers.  Location: Patient: home Provider: office   I discussed the limitations, risks, security and privacy concerns of performing an evaluation and management service by telephone and the availability of in person appointments. I also discussed with the patient that there may be a patient responsible charge related to this service. The patient expressed understanding and agreed to proceed.   History of Present Illness:    Observations/Objective:   Assessment and Plan:   Follow Up Instructions:    I discussed the assessment and treatment plan with the patient. The patient was provided an opportunity to ask questions and all were answered. The patient agreed with the plan and demonstrated an understanding of the instructions.   The patient was advised to call back or seek an in-person evaluation if the symptoms worsen or if the condition fails to improve as anticipated.  I provided 22 minutes of non-face-to-face time during this encounter.      Review of Systems See above    Objective:   Physical Exam  Virtual      Assessment & Plan:  Impression viral syndrome.  Both respiratory and GI features.  Recently treated UTI.  Will add Zofran as needed for nausea.  Hopefully this is a non-COVID-19 viral syndrome, however  testing strongly encouraged.  Rationale discussed

## 2019-09-29 LAB — NOVEL CORONAVIRUS, NAA: SARS-CoV-2, NAA: NOT DETECTED

## 2019-10-17 ENCOUNTER — Ambulatory Visit (INDEPENDENT_AMBULATORY_CARE_PROVIDER_SITE_OTHER): Payer: 59 | Admitting: Family Medicine

## 2019-10-17 ENCOUNTER — Other Ambulatory Visit: Payer: Self-pay

## 2019-10-17 DIAGNOSIS — N3000 Acute cystitis without hematuria: Secondary | ICD-10-CM

## 2019-10-17 MED ORDER — CIPROFLOXACIN HCL 500 MG PO TABS: 500.0000 mg | ORAL_TABLET | Freq: Two times a day (BID) | ORAL | 0 refills | Status: DC

## 2019-10-17 NOTE — Progress Notes (Signed)
   Subjective:    Patient ID: Julia [name], female    DOB: 01-23-1956, 64 y.o.   MRN: [id number]  HPI Mild UTI symptoms been present over the past several days denies high fever chills sweats wheezing difficulty breathing Virtual Visit via Video Note  I connected with Julia [name] on 10/20/19 at  4:10 PM EDT by a video enabled telemedicine application and verified that I am speaking with the correct person using two identifiers.  Location: Patient: Home Provider: Office   I discussed the limitations of evaluation and management by telemedicine and the availability of in person appointments. The patient expressed understanding and agreed to proceed.  History of Present Illness:    Observations/Objective:   Assessment and Plan:   Follow Up Instructions:    I discussed the assessment and treatment plan with the patient. The patient was provided an opportunity to ask questions and all were answered. The patient agreed with the plan and demonstrated an understanding of the instructions.   The patient was advised to call back or seek an in-person evaluation if the symptoms worsen or if the condition fails to improve as anticipated.  I provided 12 minutes of non-face-to-face time during this encounter.   Sallee Lange, MD     Review of Systems    Dysuria urinary frequency Objective:   Physical Exam  Virtual visit  Denies hematuria denies fever chills flank pain    Assessment & Plan:  UTI Warning signs discussed Antibiotic sent

## 2019-11-17 ENCOUNTER — Telehealth: Payer: Self-pay | Admitting: Family Medicine

## 2019-11-17 ENCOUNTER — Other Ambulatory Visit: Payer: Self-pay | Admitting: *Deleted

## 2019-11-17 MED ORDER — LISINOPRIL-HYDROCHLOROTHIAZIDE 20-25 MG PO TABS: 1.0000 | ORAL_TABLET | Freq: Every morning | ORAL | 0 refills | Status: DC

## 2019-11-17 MED ORDER — POTASSIUM CHLORIDE CRYS ER 10 MEQ PO TBCR: 10.0000 meq | EXTENDED_RELEASE_TABLET | Freq: Two times a day (BID) | ORAL | 1 refills | Status: DC

## 2019-11-17 NOTE — Telephone Encounter (Signed)
Prescription sent electronically to pharmacy. 

## 2019-11-17 NOTE — Addendum Note (Signed)
Addended by: Dairl Ponder on: 11/17/2019 01:56 PM   Modules accepted: Orders

## 2019-11-17 NOTE — Telephone Encounter (Signed)
Fax from [facility] requesting refill on Potassium 10 mEq tablets. Pt last seen 10/17/19 for cystitis. Please advise. Thank you

## 2019-11-17 NOTE — Telephone Encounter (Signed)
May have 90-day patient needs to do follow-up visit within 3 months

## 2019-12-23 ENCOUNTER — Other Ambulatory Visit: Payer: Self-pay | Admitting: Family Medicine

## 2020-02-24 ENCOUNTER — Other Ambulatory Visit: Payer: Self-pay | Admitting: Family Medicine

## 2020-03-20 ENCOUNTER — Encounter: Payer: Self-pay | Admitting: Family Medicine

## 2020-03-20 ENCOUNTER — Ambulatory Visit (INDEPENDENT_AMBULATORY_CARE_PROVIDER_SITE_OTHER): Payer: 59 | Admitting: Family Medicine

## 2020-03-20 ENCOUNTER — Other Ambulatory Visit: Payer: Self-pay

## 2020-03-20 VITALS — BP 118/70 | Temp 96.9°F | Wt 165.2 lb

## 2020-03-20 DIAGNOSIS — R1032 Left lower quadrant pain: Secondary | ICD-10-CM

## 2020-03-20 DIAGNOSIS — K921 Melena: Secondary | ICD-10-CM

## 2020-03-20 DIAGNOSIS — I10 Essential (primary) hypertension: Secondary | ICD-10-CM

## 2020-03-20 DIAGNOSIS — E7849 Other hyperlipidemia: Secondary | ICD-10-CM

## 2020-03-20 DIAGNOSIS — K760 Fatty (change of) liver, not elsewhere classified: Secondary | ICD-10-CM

## 2020-03-20 MED ORDER — LISINOPRIL-HYDROCHLOROTHIAZIDE 20-25 MG PO TABS: 1.0000 | ORAL_TABLET | Freq: Every morning | ORAL | 5 refills | Status: DC

## 2020-03-20 MED ORDER — POTASSIUM CHLORIDE CRYS ER 10 MEQ PO TBCR: 10.0000 meq | EXTENDED_RELEASE_TABLET | Freq: Two times a day (BID) | ORAL | 5 refills | Status: DC

## 2020-03-20 MED ORDER — PANTOPRAZOLE SODIUM 40 MG PO TBEC: DELAYED_RELEASE_TABLET | ORAL | 5 refills | Status: DC

## 2020-03-20 MED ORDER — ALBUTEROL SULFATE HFA 108 (90 BASE) MCG/ACT IN AERS: 2.0000 | INHALATION_SPRAY | Freq: Four times a day (QID) | RESPIRATORY_TRACT | 5 refills | Status: DC | PRN

## 2020-03-20 NOTE — Progress Notes (Signed)
Subjective:    Patient ID: Julia [name], female    DOB: 1955-12-31, 64 y.o.   MRN: [id number]  HPI Pt here for med check. Pt is taking Lisinopril-HCTZ for HTN. Pt also taking Potassium, hydroxyzine and Claritin as needed.   Pt also has been having some blood in stool and LLQ pain. Pt states she has had blood in stool twice. Pt did take a stool softener.  LLQ abdominal pain - Plan: Ceruloplasmin, Hepatitis B Surface AntiGEN, Ferritin, Antinuclear Antib (ANA), Hepatitis C Antibody, Basic Metabolic Panel (BMET), Hepatic function panel, Lipid Profile, Ambulatory referral to Gastroenterology  Blood in stool - Plan: Ceruloplasmin, Hepatitis B Surface AntiGEN, Ferritin, Antinuclear Antib (ANA), Hepatitis C Antibody, Basic Metabolic Panel (BMET), Hepatic function panel, Lipid Profile, Ambulatory referral to Gastroenterology  Essential hypertension - Plan: Ceruloplasmin, Hepatitis B Surface AntiGEN, Ferritin, Antinuclear Antib (ANA), Hepatitis C Antibody, Basic Metabolic Panel (BMET), Hepatic function panel, Lipid Profile  Other hyperlipidemia - Plan: Ceruloplasmin, Hepatitis B Surface AntiGEN, Ferritin, Antinuclear Antib (ANA), Hepatitis C Antibody, Basic Metabolic Panel (BMET), Hepatic function panel, Lipid Profile  Fatty liver - Plan: Ceruloplasmin, Hepatitis B Surface AntiGEN, Ferritin, Antinuclear Antib (ANA), Hepatitis C Antibody, Basic Metabolic Panel (BMET), Hepatic function panel, Lipid Profile  Patient states amount of blood very small Denies any fever chills weight loss. Patient try to work hard on cholesterol by watching diet Minimizing salt in the diet and staying physically active taking blood pressure medicine Patient knows she needs to have a colonoscopy in the past she is deferred this  Review of Systems  Constitutional: Negative for activity change, appetite change and fatigue.  HENT: Negative for congestion and rhinorrhea.   Respiratory: Negative for cough and shortness of  breath.   Cardiovascular: Negative for chest pain and leg swelling.  Gastrointestinal: Positive for abdominal pain and blood in stool. Negative for diarrhea.  Endocrine: Negative for polydipsia and polyphagia.  Skin: Negative for color change.  Neurological: Negative for dizziness and weakness.  Psychiatric/Behavioral: Negative for behavioral problems and confusion.       Objective:   Physical Exam Vitals reviewed.  Constitutional:      General: She is not in acute distress. HENT:     Head: Normocephalic and atraumatic.  Eyes:     General:        Right eye: No discharge.        Left eye: No discharge.  Neck:     Trachea: No tracheal deviation.  Cardiovascular:     Rate and Rhythm: Normal rate and regular rhythm.     Heart sounds: Normal heart sounds. No murmur heard.   Pulmonary:     Effort: Pulmonary effort is normal. No respiratory distress.     Breath sounds: Normal breath sounds.  Lymphadenopathy:     Cervical: No cervical adenopathy.  Skin:    General: Skin is warm and dry.  Neurological:     Mental Status: She is alert.     Coordination: Coordination normal.  Psychiatric:        Behavior: Behavior normal.    Tender left lower quadrant on physical exam       Assessment & Plan:  1. LLQ abdominal pain Check lab work.  Await results - Ceruloplasmin - Hepatitis B Surface AntiGEN - Ferritin - Antinuclear Antib (ANA) - Hepatitis C Antibody - Basic Metabolic Panel (BMET) - Hepatic function panel - Lipid Profile - Ambulatory referral to Gastroenterology  2. Blood in stool Needs colonoscopy referral placed - Ceruloplasmin -  Hepatitis B Surface AntiGEN - Ferritin - Antinuclear Antib (ANA) - Hepatitis C Antibody - Basic Metabolic Panel (BMET) - Hepatic function panel - Lipid Profile - Ambulatory referral to Gastroenterology  3. Essential hypertension Blood pressure good control continue current measures - Ceruloplasmin - Hepatitis B Surface  AntiGEN - Ferritin - Antinuclear Antib (ANA) - Hepatitis C Antibody - Basic Metabolic Panel (BMET) - Hepatic function panel - Lipid Profile  4. Other hyperlipidemia Watch diet stay healthy with eating habits - Ceruloplasmin - Hepatitis B Surface AntiGEN - Ferritin - Antinuclear Antib (ANA) - Hepatitis C Antibody - Basic Metabolic Panel (BMET) - Hepatic function panel - Lipid Profile  5. Fatty liver Check lab work await results - Ceruloplasmin - Hepatitis B Surface AntiGEN - Ferritin - Antinuclear Antib (ANA) - Hepatitis C Antibody - Basic Metabolic Panel (BMET) - Hepatic function panel - Lipid Profile

## 2020-03-21 LAB — HEPATITIS C ANTIBODY: Hep C Virus Ab: 0.2 s/co ratio (ref 0.0–0.9)

## 2020-03-21 LAB — HEPATITIS B SURFACE ANTIGEN: Hepatitis B Surface Ag: NEGATIVE

## 2020-03-21 LAB — BASIC METABOLIC PANEL
BUN/Creatinine Ratio: 19 (ref 12–28)
BUN: 17 mg/dL (ref 8–27)
CO2: 28 mmol/L (ref 20–29)
Calcium: 9.9 mg/dL (ref 8.7–10.3)
Chloride: 97 mmol/L (ref 96–106)
Creatinine, Ser: 0.9 mg/dL (ref 0.57–1.00)
GFR calc Af Amer: 79 mL/min/{1.73_m2} (ref >59)
GFR calc non Af Amer: 69 mL/min/{1.73_m2} (ref >59)
Glucose: 95 mg/dL (ref 65–99)
Potassium: 4.4 mmol/L (ref 3.5–5.2)
Sodium: 137 mmol/L (ref 134–144)

## 2020-03-21 LAB — ANA: Anti Nuclear Antibody (ANA): NEGATIVE

## 2020-03-21 LAB — HEPATIC FUNCTION PANEL
ALT: 14 IU/L (ref 0–32)
AST: 14 IU/L (ref 0–40)
Albumin: 4.7 g/dL (ref 3.8–4.8)
Alkaline Phosphatase: 62 IU/L (ref 48–121)
Bilirubin Total: 0.4 mg/dL (ref 0.0–1.2)
Bilirubin, Direct: 0.12 mg/dL (ref 0.00–0.40)
Total Protein: 7 g/dL (ref 6.0–8.5)

## 2020-03-21 LAB — LIPID PANEL
Chol/HDL Ratio: 3.5 ratio (ref 0.0–4.4)
Cholesterol, Total: 223 mg/dL — ABNORMAL HIGH (ref 100–199)
HDL: 64 mg/dL (ref >39)
LDL Chol Calc (NIH): 142 mg/dL — ABNORMAL HIGH (ref 0–99)
Triglycerides: 96 mg/dL (ref 0–149)
VLDL Cholesterol Cal: 17 mg/dL (ref 5–40)

## 2020-03-21 LAB — CERULOPLASMIN: Ceruloplasmin: 25.7 mg/dL (ref 19.0–39.0)

## 2020-03-21 LAB — FERRITIN: Ferritin: 249 ng/mL — ABNORMAL HIGH (ref 15–150)

## 2020-03-25 ENCOUNTER — Telehealth: Payer: Self-pay | Admitting: Family Medicine

## 2020-03-25 ENCOUNTER — Other Ambulatory Visit: Payer: Self-pay | Admitting: Family Medicine

## 2020-03-25 DIAGNOSIS — K625 Hemorrhage of anus and rectum: Secondary | ICD-10-CM

## 2020-03-25 DIAGNOSIS — R1032 Left lower quadrant pain: Secondary | ICD-10-CM

## 2020-03-25 NOTE — Telephone Encounter (Signed)
Nurses This patient was having some left lower quadrant pain and discomfort along with rectal bleeding.  I did speak with gastroenterology-Dr Rourk-nurse practitioner-it is recommended for patient to do CBC and also do a urgent CT scan abdomen pelvis with contrast with attention to the left lower quadrant due to discomfort and rectal bleeding.  This CT scan needs to be completed this week. Then gastroenterology will be seeing the patient in the near future Please talk with patient let her know the above if there is any questions or concerns let me know.

## 2020-03-25 NOTE — Telephone Encounter (Signed)
Attempted to contact patient; pt does not have voicemail set up at this time. CT scan ordered and lab work orders placed. Sent pt a my chart message.

## 2020-03-25 NOTE — Telephone Encounter (Signed)
Pt returned call I let her know CT scan and Lab work was ordered. Pt said she does not do My Chart. I let her know her voice mail was not set up.

## 2020-03-26 ENCOUNTER — Encounter: Payer: Self-pay | Admitting: Gastroenterology

## 2020-03-26 ENCOUNTER — Other Ambulatory Visit: Payer: Self-pay

## 2020-03-26 ENCOUNTER — Ambulatory Visit (INDEPENDENT_AMBULATORY_CARE_PROVIDER_SITE_OTHER): Payer: 59 | Admitting: Gastroenterology

## 2020-03-26 DIAGNOSIS — R198 Other specified symptoms and signs involving the digestive system and abdomen: Secondary | ICD-10-CM | POA: Diagnosis not present

## 2020-03-26 DIAGNOSIS — R1032 Left lower quadrant pain: Secondary | ICD-10-CM | POA: Insufficient documentation

## 2020-03-26 DIAGNOSIS — K625 Hemorrhage of anus and rectum: Secondary | ICD-10-CM | POA: Insufficient documentation

## 2020-03-26 NOTE — H&P (View-Only) (Signed)
Primary Care Physician:  Kathyrn Drown, MD  Primary Gastroenterologist:  Elon Alas. Abbey Chatters, DO   Chief Complaint  Patient presents with  . Abdominal Pain    LLQ abd pain,constipation and loose stools at times, blood in stool last Friday    HPI:  Julia [name] is a 64 y.o. female here at the request of Dr. Sallee Lange for further evaluation of LLQ pain and blood in stool.   Patient states she woke up with LLQ pain about two weeks ago. Associated with bout of constipation. Took stool softener but didn't go much. Increased fiber and water and then two days later large stool with dark blood present. Few more days more constipation. Then little bouts of diarrhea. Feeling obstructed. Waking up with pain and burning in LLQ. Moved into middle of abdomen. Last episode of blood in the stool was Friday. No melena. No fever. Pain still present but not worse. Feels like something in the LLQ and causing obstructions. Maternal aunt recently diagnosed with colon cancer, she is in her 22s. Patient has never had a colonoscopy.  H/O fatty liver. Ruq U/S with elastography 04/2018, hepatic steatosis with F2+some F3. One year ago her AST was 52, ALT 68 but currently normal. Viral markers (Hep B and C) negative. Ferritin recently 249. ANA negative.   Weighed 177 in 08/2018 Weighed 162 in 11/2018 Weighed 163 today  History of alcohol abuse, drank heavily for about 4 years but quit in 2015. Reports fighting propensity of alcoholism all of her life.     Current Outpatient Medications  Medication Sig Dispense Refill  . albuterol (VENTOLIN HFA) 108 (90 Base) MCG/ACT inhaler Inhale 2 puffs into the lungs every 6 (six) hours as needed for wheezing or shortness of breath. 18 g 5  . hydrOXYzine (ATARAX/VISTARIL) 25 MG tablet 1 qhs prn insomnia 90 tablet 1  . ibuprofen (ADVIL,MOTRIN) 200 MG tablet Take 2 tablets (400 mg total) by mouth every 6 (six) hours as needed for headache or mild pain. For headache (Patient taking  differently: Take 400 mg by mouth as needed for headache or mild pain. For headache) 30 tablet 0  . lisinopril-hydrochlorothiazide (ZESTORETIC) 20-25 MG tablet Take 1 tablet by mouth every morning. 30 tablet 5  . loratadine (CLARITIN) 10 MG tablet Take 10 mg by mouth daily.    . pantoprazole (PROTONIX) 40 MG tablet TAKE 1 TABLET(40 MG) BY MOUTH DAILY 30 tablet 5  . potassium chloride (KLOR-CON) 10 MEQ tablet Take 1 tablet (10 mEq total) by mouth 2 (two) times daily. 60 tablet 5   No current facility-administered medications for this visit.    Allergies as of 03/26/2020 - Review Complete 03/26/2020  Allergen Reaction Noted  . Ceftin [cefuroxime axetil] Shortness Of Breath, Swelling, and Rash 09/09/2012  . Codeine Nausea And Vomiting 09/12/2013  . Vicodin [hydrocodone-acetaminophen]  12/02/2012  . Metformin and related  06/14/2013    Past Medical History:  Diagnosis Date  . Anxiety disorder   . Depression, major    Suicide attempt w/ injection of KCL  . ETOH abuse    remission since 2015  . GERD (gastroesophageal reflux disease)   . Hypertension   . Meningitis   . Obesity     Past Surgical History:  Procedure Laterality Date  . APPENDECTOMY    . BREAST SURGERY     biopsy    Family History  Problem Relation Age of Onset  . Cancer Mother        Breast  .  Hypertension Father   . Colon cancer Maternal Aunt        90s    Social History   Socioeconomic History  . Marital status: Married    Spouse name: Not on file  . Number of children: Not on file  . Years of education: Not on file  . Highest education level: Not on file  Occupational History  . Occupation: retired Therapist, sports  Tobacco Use  . Smoking status: Former Smoker    Packs/day: 1.50    Years: 2.00    Pack years: 3.00    Types: Cigarettes  . Smokeless tobacco: Never Used  Substance and Sexual Activity  . Alcohol use: Not Currently    Alcohol/week: 0.0 standard drinks    Comment: twice montly 1 pint total, quit  6 years ago (2015)  . Drug use: No  . Sexual activity: Not Currently  Other Topics Concern  . Not on file  Social History Narrative  . Not on file   Social Determinants of Health   Financial Resource Strain:   . Difficulty of Paying Living Expenses: Not on file  Food Insecurity:   . Worried About Charity fundraiser in the Last Year: Not on file  . Ran Out of Food in the Last Year: Not on file  Transportation Needs:   . Lack of Transportation (Medical): Not on file  . Lack of Transportation (Non-Medical): Not on file  Physical Activity:   . Days of Exercise per Week: Not on file  . Minutes of Exercise per Session: Not on file  Stress:   . Feeling of Stress : Not on file  Social Connections:   . Frequency of Communication with Friends and Family: Not on file  . Frequency of Social Gatherings with Friends and Family: Not on file  . Attends Religious Services: Not on file  . Active Member of Clubs or Organizations: Not on file  . Attends Archivist Meetings: Not on file  . Marital Status: Not on file  Intimate Partner Violence:   . Fear of Current or Ex-Partner: Not on file  . Emotionally Abused: Not on file  . Physically Abused: Not on file  . Sexually Abused: Not on file      ROS:  General: Negative for anorexia, weight loss, fever, chills, fatigue, weakness. Eyes: Negative for vision changes.  ENT: Negative for hoarseness, difficulty swallowing , nasal congestion. CV: Negative for chest pain, angina, palpitations, dyspnea on exertion, peripheral edema.  Respiratory: Negative for dyspnea at rest, dyspnea on exertion, cough, sputum, wheezing.  GI: See history of present illness. GU:  Negative for dysuria, hematuria, urinary incontinence, urinary frequency, nocturnal urination.  MS: Negative for joint pain, low back pain.  Derm: Negative for rash or itching.  Neuro: Negative for weakness, abnormal sensation, seizure, frequent headaches, memory loss, confusion.   Psych: Negative for anxiety, depression, suicidal ideation, hallucinations.  Endo: Negative for unusual weight change.  Heme: Negative for bruising or bleeding. Allergy: Negative for rash or hives.    Physical Examination:  BP 125/88   Pulse 85   Temp 98.2 F (36.8 C) (Oral)   Ht 5\' 2"  (1.575 m)   Wt 163 lb 9.6 oz (74.2 kg)   BMI 29.92 kg/m    General: Well-nourished, well-developed in no acute distress.  Head: Normocephalic, atraumatic.   Eyes: Conjunctiva pink, no icterus. Mouth:masked Neck: Supple without thyromegaly, masses, or lymphadenopathy.  Lungs: Clear to auscultation bilaterally.  Heart: Regular rate and rhythm, no  murmurs rubs or gallops.  Abdomen: Bowel sounds are normal,  nondistended, no hepatosplenomegaly or masses, no abdominal bruits or    hernia , no rebound or guarding.  Mild to moderate LLQ tenderness Rectal: not performed Extremities: No lower extremity edema. No clubbing or deformities.  Neuro: Alert and oriented x 4 , grossly normal neurologically.  Skin: Warm and dry, no rash or jaundice.   Psych: Alert and cooperative, normal mood and affect.  Labs:  Lab Results  Component Value Date   CREATININE 0.90 03/20/2020   BUN 17 03/20/2020   NA 137 03/20/2020   K 4.4 03/20/2020   CL 97 03/20/2020   CO2 28 03/20/2020   Lab Results  Component Value Date   ALT 14 03/20/2020   AST 14 03/20/2020   ALKPHOS 62 03/20/2020   BILITOT 0.4 03/20/2020   Lab Results  Component Value Date   FERRITIN 249 (H) 03/20/2020     Imaging Studies: No results found.  Impression/Plan:  Pleasant 64 y/o female presents for further evaluation of recent onset LLQ pain in setting of constipation and associated with blood in the stool. Bowel function with change, does not feel like bowel movements are adequate. Using stool softeners, trying to avoid harsh laxatives. Ddx includes constipation or diverticulitis with benign anorectal bleeding vs malignancy. Unlikely  ischemic colitis or IBD. I agree with CT A/P with contrast ordered STAT by PCP yesterday but it has not been scheduled. I have asked she contact PCP's office today to find out if CT is pending insurance approval or if it can be scheduled. She is also due CBC which was ordered yesterday as well. Once CT obtained, we will move towards colonoscopy as appropriate. Would hold off on antibiotic therapy if we can obtain a CT in next 24 hours given she does not look acutely ill.

## 2020-03-26 NOTE — Patient Instructions (Signed)
1. Your CT scan has been ordered STAT by your PCP. It looks like it is pending insurance review. Please call your PCP and follow up in order to get this scheduled ASAP.  2. Please go for the CBC today.  3. Would advise holding off antibiotics until we get your CT results. If your symptoms worsen please let me know. 4. Make sure you stay on top of your stools to get them moving, you can use stool softener or Miralax one capful once to twice daily is okay to take.

## 2020-03-26 NOTE — Progress Notes (Addendum)
Primary Care Physician:  Kathyrn Drown, MD  Primary Gastroenterologist:  Elon Alas. Abbey Chatters, DO   Chief Complaint  Patient presents with   Abdominal Pain    LLQ abd pain,constipation and loose stools at times, blood in stool last Friday    HPI:  Julia [name] is a 64 y.o. female here at the request of Dr. Sallee Lange for further evaluation of LLQ pain and blood in stool.   Patient states she woke up with LLQ pain about two weeks ago. Associated with bout of constipation. Took stool softener but didn't go much. Increased fiber and water and then two days later large stool with dark blood present. Few more days more constipation. Then little bouts of diarrhea. Feeling obstructed. Waking up with pain and burning in LLQ. Moved into middle of abdomen. Last episode of blood in the stool was Friday. No melena. No fever. Pain still present but not worse. Feels like something in the LLQ and causing obstructions. Maternal aunt recently diagnosed with colon cancer, she is in her 86s. Patient has never had a colonoscopy.  H/O fatty liver. Ruq U/S with elastography 04/2018, hepatic steatosis with F2+some F3. One year ago her AST was 52, ALT 68 but currently normal. Viral markers (Hep B and C) negative. Ferritin recently 249. ANA negative.   Weighed 177 in 08/2018 Weighed 162 in 11/2018 Weighed 163 today  History of alcohol abuse, drank heavily for about 4 years but quit in 2015. Reports fighting propensity of alcoholism all of her life.     Current Outpatient Medications  Medication Sig Dispense Refill   albuterol (VENTOLIN HFA) 108 (90 Base) MCG/ACT inhaler Inhale 2 puffs into the lungs every 6 (six) hours as needed for wheezing or shortness of breath. 18 g 5   hydrOXYzine (ATARAX/VISTARIL) 25 MG tablet 1 qhs prn insomnia 90 tablet 1   ibuprofen (ADVIL,MOTRIN) 200 MG tablet Take 2 tablets (400 mg total) by mouth every 6 (six) hours as needed for headache or mild pain. For headache (Patient taking  differently: Take 400 mg by mouth as needed for headache or mild pain. For headache) 30 tablet 0   lisinopril-hydrochlorothiazide (ZESTORETIC) 20-25 MG tablet Take 1 tablet by mouth every morning. 30 tablet 5   loratadine (CLARITIN) 10 MG tablet Take 10 mg by mouth daily.     pantoprazole (PROTONIX) 40 MG tablet TAKE 1 TABLET(40 MG) BY MOUTH DAILY 30 tablet 5   potassium chloride (KLOR-CON) 10 MEQ tablet Take 1 tablet (10 mEq total) by mouth 2 (two) times daily. 60 tablet 5   No current facility-administered medications for this visit.    Allergies as of 03/26/2020 - Review Complete 03/26/2020  Allergen Reaction Noted   Ceftin [cefuroxime axetil] Shortness Of Breath, Swelling, and Rash 09/09/2012   Codeine Nausea And Vomiting 09/12/2013   Vicodin [hydrocodone-acetaminophen]  12/02/2012   Metformin and related  06/14/2013    Past Medical History:  Diagnosis Date   Anxiety disorder    Depression, major    Suicide attempt w/ injection of KCL   ETOH abuse    remission since 2015   GERD (gastroesophageal reflux disease)    Hypertension    Meningitis    Obesity     Past Surgical History:  Procedure Laterality Date   APPENDECTOMY     BREAST SURGERY     biopsy    Family History  Problem Relation Age of Onset   Cancer Mother        Breast  Hypertension Father    Colon cancer Maternal Aunt        75s    Social History   Socioeconomic History   Marital status: Married    Spouse name: Not on file   Number of children: Not on file   Years of education: Not on file   Highest education level: Not on file  Occupational History   Occupation: retired Therapist, sports  Tobacco Use   Smoking status: Former Smoker    Packs/day: 1.50    Years: 2.00    Pack years: 3.00    Types: Cigarettes   Smokeless tobacco: Never Used  Substance and Sexual Activity   Alcohol use: Not Currently    Alcohol/week: 0.0 standard drinks    Comment: twice montly 1 pint total, quit  6 years ago (2015)   Drug use: No   Sexual activity: Not Currently  Other Topics Concern   Not on file  Social History Narrative   Not on file   Social Determinants of Health   Financial Resource Strain:    Difficulty of Paying Living Expenses: Not on file  Food Insecurity:    Worried About Charity fundraiser in the Last Year: Not on file   [place] in the Last Year: Not on file  Transportation Needs:    Lack of Transportation (Medical): Not on file   Lack of Transportation (Non-Medical): Not on file  Physical Activity:    Days of Exercise per Week: Not on file   Minutes of Exercise per Session: Not on file  Stress:    Feeling of Stress : Not on file  Social Connections:    Frequency of Communication with Friends and Family: Not on file   Frequency of Social Gatherings with Friends and Family: Not on file   Attends Religious Services: Not on file   Active Member of Clubs or Organizations: Not on file   Attends Archivist Meetings: Not on file   Marital Status: Not on file  Intimate Partner Violence:    Fear of Current or Ex-Partner: Not on file   Emotionally Abused: Not on file   Physically Abused: Not on file   Sexually Abused: Not on file      ROS:  General: Negative for anorexia, weight loss, fever, chills, fatigue, weakness. Eyes: Negative for vision changes.  ENT: Negative for hoarseness, difficulty swallowing , nasal congestion. CV: Negative for chest pain, angina, palpitations, dyspnea on exertion, peripheral edema.  Respiratory: Negative for dyspnea at rest, dyspnea on exertion, cough, sputum, wheezing.  GI: See history of present illness. GU:  Negative for dysuria, hematuria, urinary incontinence, urinary frequency, nocturnal urination.  MS: Negative for joint pain, low back pain.  Derm: Negative for rash or itching.  Neuro: Negative for weakness, abnormal sensation, seizure, frequent headaches, memory loss, confusion.   Psych: Negative for anxiety, depression, suicidal ideation, hallucinations.  Endo: Negative for unusual weight change.  Heme: Negative for bruising or bleeding. Allergy: Negative for rash or hives.    Physical Examination:  BP 125/88    Pulse 85    Temp 98.2 F (36.8 C) (Oral)    Ht 5\' 2"  (1.575 m)    Wt 163 lb 9.6 oz (74.2 kg)    BMI 29.92 kg/m    General: Well-nourished, well-developed in no acute distress.  Head: Normocephalic, atraumatic.   Eyes: Conjunctiva pink, no icterus. Mouth:masked Neck: Supple without thyromegaly, masses, or lymphadenopathy.  Lungs: Clear to auscultation bilaterally.  Heart:  Regular rate and rhythm, no murmurs rubs or gallops.  Abdomen: Bowel sounds are normal,  nondistended, no hepatosplenomegaly or masses, no abdominal bruits or    hernia , no rebound or guarding.  Mild to moderate LLQ tenderness Rectal: not performed Extremities: No lower extremity edema. No clubbing or deformities.  Neuro: Alert and oriented x 4 , grossly normal neurologically.  Skin: Warm and dry, no rash or jaundice.   Psych: Alert and cooperative, normal mood and affect.  Labs:  Lab Results  Component Value Date   CREATININE 0.90 03/20/2020   BUN 17 03/20/2020   NA 137 03/20/2020   K 4.4 03/20/2020   CL 97 03/20/2020   CO2 28 03/20/2020   Lab Results  Component Value Date   ALT 14 03/20/2020   AST 14 03/20/2020   ALKPHOS 62 03/20/2020   BILITOT 0.4 03/20/2020   Lab Results  Component Value Date   FERRITIN 249 (H) 03/20/2020     Imaging Studies: No results found.  Impression/Plan:  Pleasant 64 y/o female presents for further evaluation of recent onset LLQ pain in setting of constipation and associated with blood in the stool. Bowel function with change, does not feel like bowel movements are adequate. Using stool softeners, trying to avoid harsh laxatives. Ddx includes constipation or diverticulitis with benign anorectal bleeding vs malignancy. Unlikely  ischemic colitis or IBD. I agree with CT A/P with contrast ordered STAT by PCP yesterday but it has not been scheduled. I have asked she contact PCP's office today to find out if CT is pending insurance approval or if it can be scheduled. She is also due CBC which was ordered yesterday as well. Once CT obtained, we will move towards colonoscopy as appropriate. Would hold off on antibiotic therapy if we can obtain a CT in next 24 hours given she does not look acutely ill.

## 2020-03-27 ENCOUNTER — Telehealth: Payer: Self-pay | Admitting: Family Medicine

## 2020-03-27 ENCOUNTER — Other Ambulatory Visit: Payer: Self-pay | Admitting: *Deleted

## 2020-03-27 ENCOUNTER — Ambulatory Visit
Admission: RE | Admit: 2020-03-27 | Discharge: 2020-03-27 | Disposition: A | Discharge status: Home / Self Care | Payer: 59 | Source: Ambulatory Visit | Attending: Family Medicine | Admitting: Family Medicine

## 2020-03-27 DIAGNOSIS — R1032 Left lower quadrant pain: Secondary | ICD-10-CM

## 2020-03-27 DIAGNOSIS — K625 Hemorrhage of anus and rectum: Secondary | ICD-10-CM

## 2020-03-27 LAB — CBC WITH DIFFERENTIAL/PLATELET
Basophils Absolute: 0.2 10*3/uL (ref 0.0–0.2)
Basos: 3 %
EOS (ABSOLUTE): 0.3 10*3/uL (ref 0.0–0.4)
Eos: 3 %
Hematocrit: 42.3 % (ref 34.0–46.6)
Hemoglobin: 14.6 g/dL (ref 11.1–15.9)
Immature Grans (Abs): 0 10*3/uL (ref 0.0–0.1)
Immature Granulocytes: 0 %
Lymphocytes Absolute: 2.9 10*3/uL (ref 0.7–3.1)
Lymphs: 32 %
MCH: 29.2 pg (ref 26.6–33.0)
MCHC: 34.5 g/dL (ref 31.5–35.7)
MCV: 85 fL (ref 79–97)
Monocytes Absolute: 0.8 10*3/uL (ref 0.1–0.9)
Monocytes: 9 %
Neutrophils Absolute: 4.7 10*3/uL (ref 1.4–7.0)
Neutrophils: 53 %
Platelets: 287 10*3/uL (ref 150–450)
RBC: 5 x10E6/uL (ref 3.77–5.28)
RDW: 12.1 % (ref 11.7–15.4)
WBC: 9 10*3/uL (ref 3.4–10.8)

## 2020-03-27 MED ORDER — IOPAMIDOL (ISOVUE-300) INJECTION 61%
100.0000 mL | Freq: Once | INTRAVENOUS | Status: AC | PRN
  Administered 2020-03-27: 100 mL via INTRAVENOUS

## 2020-03-27 NOTE — Telephone Encounter (Signed)
Patient is at [facility] to have ct scan done now.

## 2020-03-27 NOTE — Telephone Encounter (Signed)
Please tell patient no tumor seen on CT no diverticulitis Further details will be posted on mychart later when I have time to document further

## 2020-03-27 NOTE — Telephone Encounter (Signed)
Lmtc

## 2020-03-27 NOTE — Telephone Encounter (Signed)
Nurses This CT scan was ordered stat.  This scheduling is pending?. She saw gastroenterology who agreed that the CT scan needs to be done ASAP Lets make sure that the CT scan gets done if possible today if not possible by today by tomorrow morning thank you If I have to do a peer-to-peer please let me know

## 2020-03-28 ENCOUNTER — Other Ambulatory Visit: Payer: Self-pay | Admitting: *Deleted

## 2020-03-28 ENCOUNTER — Telehealth: Payer: Self-pay | Admitting: Gastroenterology

## 2020-03-28 DIAGNOSIS — E7849 Other hyperlipidemia: Secondary | ICD-10-CM

## 2020-03-28 DIAGNOSIS — Z79899 Other long term (current) drug therapy: Secondary | ICD-10-CM

## 2020-03-28 NOTE — Telephone Encounter (Signed)
Informed pt that Neil Crouch, PA reviewed her CT. No evidence of tumor or diverticulitis. CBC with no anemia.    Informed her that Magda Paganini recommends she take Miralax 17 grams once or twice daily to gently get her BMs moving. It's important to keep stools soft and avoid constipation.    Pt informed that we will be in touch with her to schedule her for colonoscopy with Dr. Abbey Chatters, ASA II.  Pt voiced understanding to all results and recommendations given by Neil Crouch, PA.

## 2020-03-28 NOTE — Telephone Encounter (Signed)
Please let pt know I reviewed her CT. No evidence of tumor or diverticulitis. CBC with no anemia.  Let's have her take Miralax 17 grams once or twice daily to gently can her BMs moving. It's important to keep stools soft and avoid constipation.   Schedule her for colonoscopy with Dr. Abbey Chatters, ASA II.

## 2020-03-28 NOTE — Telephone Encounter (Signed)
Patient notified

## 2020-03-29 ENCOUNTER — Other Ambulatory Visit: Payer: Self-pay | Admitting: Family Medicine

## 2020-03-29 ENCOUNTER — Telehealth: Payer: Self-pay | Admitting: Internal Medicine

## 2020-03-29 MED ORDER — ROSUVASTATIN CALCIUM 5 MG PO TABS: ORAL_TABLET | ORAL | 5 refills | Status: DC

## 2020-03-29 NOTE — Telephone Encounter (Signed)
Spoke to pt, TCS scheduled for 04/22/20 at 12:45pm. COVID test 04/19/20 at 10:15am. Orders entered. Appt letter mailed with procedure instructions.

## 2020-03-29 NOTE — Telephone Encounter (Signed)
Pt returning call. [phone]

## 2020-03-29 NOTE — Telephone Encounter (Signed)
See other phone note for today. 

## 2020-03-29 NOTE — Telephone Encounter (Signed)
Tried to call pt, no answer, LMOVM for return call.  

## 2020-04-07 ENCOUNTER — Encounter: Payer: Self-pay | Admitting: Family Medicine

## 2020-04-07 DIAGNOSIS — I700 Atherosclerosis of aorta: Secondary | ICD-10-CM | POA: Insufficient documentation

## 2020-04-07 HISTORY — DX: Atherosclerosis of aorta: I70.0

## 2020-04-09 ENCOUNTER — Encounter (HOSPITAL_COMMUNITY): Payer: Self-pay

## 2020-04-15 ENCOUNTER — Telehealth: Payer: Self-pay | Admitting: Family Medicine

## 2020-04-15 DIAGNOSIS — Z79899 Other long term (current) drug therapy: Secondary | ICD-10-CM

## 2020-04-15 DIAGNOSIS — E7849 Other hyperlipidemia: Secondary | ICD-10-CM

## 2020-04-15 NOTE — Telephone Encounter (Signed)
Patient is having side-effects from crestor 20 mg so she stopped taking it. She states causing her to have mood swings,legs aching and cant sleep at night. Please advise

## 2020-04-18 NOTE — Telephone Encounter (Signed)
Pravastatin 10 mg, take 1 on Monday, 1 on Friday, #8, 3 refills, check lipid liver profile in 3 to 4 months Report to [place] if any side effects

## 2020-04-18 NOTE — Telephone Encounter (Signed)
Patient agreeable to trying the pravastatin. Patient requesting sending the smallest amount you can to make sure she can tolerate this.

## 2020-04-18 NOTE — Telephone Encounter (Signed)
Sorry to hear this Stop the medicine please remove it from epic Patient does have underlying aortic atherosclerosis as well as elevated LDL Because of this she is at risk of strokes and heart attacks In situations such as her we recommend trying a low-dose statin 2 days a week.  Although it may not necessarily get her cholesterol down tremendously the study show that using the low-dose for 2 days a week still has benefit at lowering risk of heart attack and strokes.  Pravastatin is considered milder.  If she is willing to try it then let me know and I can direct the amount If she does choose to try it certainly if it causes side effects we would stop but there is a decent chance she will tolerate just 2 days a week.

## 2020-04-19 ENCOUNTER — Other Ambulatory Visit: Payer: Self-pay

## 2020-04-19 ENCOUNTER — Other Ambulatory Visit (HOSPITAL_COMMUNITY)
Admission: RE | Admit: 2020-04-19 | Discharge: 2020-04-19 | Disposition: A | Discharge status: Home / Self Care | Payer: 59 | Source: Ambulatory Visit | Attending: Internal Medicine | Admitting: Internal Medicine

## 2020-04-19 ENCOUNTER — Other Ambulatory Visit: Payer: Self-pay | Admitting: *Deleted

## 2020-04-19 DIAGNOSIS — Z01812 Encounter for preprocedural laboratory examination: Secondary | ICD-10-CM | POA: Insufficient documentation

## 2020-04-19 DIAGNOSIS — Z20822 Contact with and (suspected) exposure to covid-19: Secondary | ICD-10-CM | POA: Diagnosis not present

## 2020-04-19 LAB — BASIC METABOLIC PANEL WITH GFR
Anion gap: 10 (ref 5–15)
BUN: 11 mg/dL (ref 8–23)
CO2: 29 mmol/L (ref 22–32)
Calcium: 9 mg/dL (ref 8.9–10.3)
Chloride: 98 mmol/L (ref 98–111)
Creatinine, Ser: 0.87 mg/dL (ref 0.44–1.00)
GFR calc Af Amer: >60 mL/min
GFR calc non Af Amer: >60 mL/min
Glucose, Bld: 106 mg/dL — ABNORMAL HIGH (ref 70–99)
Potassium: 2.9 mmol/L — ABNORMAL LOW (ref 3.5–5.1)
Sodium: 137 mmol/L (ref 135–145)

## 2020-04-19 LAB — SARS CORONAVIRUS 2 (TAT 6-24 HRS): SARS Coronavirus 2: NEGATIVE

## 2020-04-19 MED ORDER — PRAVASTATIN SODIUM 10 MG PO TABS: ORAL_TABLET | ORAL | 3 refills | Status: DC

## 2020-04-19 NOTE — Telephone Encounter (Signed)
Patient notified and med sent to pharmacy.  

## 2020-04-22 ENCOUNTER — Other Ambulatory Visit: Payer: Self-pay

## 2020-04-22 ENCOUNTER — Encounter (HOSPITAL_COMMUNITY)
Admission: RE | Disposition: A | Discharge status: Home / Self Care | Payer: Self-pay | Source: Home / Self Care | Attending: Internal Medicine

## 2020-04-22 ENCOUNTER — Telehealth: Payer: 59 | Admitting: Family Medicine

## 2020-04-22 ENCOUNTER — Encounter (HOSPITAL_COMMUNITY): Payer: Self-pay

## 2020-04-22 ENCOUNTER — Ambulatory Visit (HOSPITAL_COMMUNITY): Payer: 59 | Admitting: Anesthesiology

## 2020-04-22 ENCOUNTER — Ambulatory Visit (HOSPITAL_COMMUNITY)
Admission: RE | Admit: 2020-04-22 | Discharge: 2020-04-22 | Disposition: A | Discharge status: Home / Self Care | Payer: 59 | Attending: Internal Medicine | Admitting: Internal Medicine

## 2020-04-22 DIAGNOSIS — Z79899 Other long term (current) drug therapy: Secondary | ICD-10-CM | POA: Insufficient documentation

## 2020-04-22 DIAGNOSIS — E669 Obesity, unspecified: Secondary | ICD-10-CM | POA: Insufficient documentation

## 2020-04-22 DIAGNOSIS — D122 Benign neoplasm of ascending colon: Secondary | ICD-10-CM | POA: Diagnosis not present

## 2020-04-22 DIAGNOSIS — R1032 Left lower quadrant pain: Secondary | ICD-10-CM | POA: Insufficient documentation

## 2020-04-22 DIAGNOSIS — Z87891 Personal history of nicotine dependence: Secondary | ICD-10-CM | POA: Diagnosis not present

## 2020-04-22 DIAGNOSIS — F419 Anxiety disorder, unspecified: Secondary | ICD-10-CM | POA: Diagnosis not present

## 2020-04-22 DIAGNOSIS — K5730 Diverticulosis of large intestine without perforation or abscess without bleeding: Secondary | ICD-10-CM | POA: Diagnosis not present

## 2020-04-22 DIAGNOSIS — Z6829 Body mass index (BMI) 29.0-29.9, adult: Secondary | ICD-10-CM | POA: Diagnosis not present

## 2020-04-22 DIAGNOSIS — K648 Other hemorrhoids: Secondary | ICD-10-CM | POA: Insufficient documentation

## 2020-04-22 DIAGNOSIS — I10 Essential (primary) hypertension: Secondary | ICD-10-CM | POA: Insufficient documentation

## 2020-04-22 DIAGNOSIS — K635 Polyp of colon: Secondary | ICD-10-CM

## 2020-04-22 DIAGNOSIS — K219 Gastro-esophageal reflux disease without esophagitis: Secondary | ICD-10-CM | POA: Insufficient documentation

## 2020-04-22 DIAGNOSIS — K921 Melena: Secondary | ICD-10-CM | POA: Diagnosis not present

## 2020-04-22 DIAGNOSIS — K625 Hemorrhage of anus and rectum: Secondary | ICD-10-CM

## 2020-04-22 HISTORY — PX: COLONOSCOPY WITH PROPOFOL: SHX5780

## 2020-04-22 HISTORY — PX: POLYPECTOMY: SHX5525

## 2020-04-22 SURGERY — COLONOSCOPY WITH PROPOFOL
Anesthesia: General

## 2020-04-22 MED ORDER — CHLORHEXIDINE GLUCONATE CLOTH 2 % EX PADS: 6.0000 | MEDICATED_PAD | Freq: Once | CUTANEOUS | Status: DC

## 2020-04-22 MED ORDER — STERILE WATER FOR IRRIGATION IR SOLN
Status: DC | PRN
  Administered 2020-04-22: 1.5 mL

## 2020-04-22 MED ORDER — LACTATED RINGERS IV SOLN: INTRAVENOUS | Status: DC | PRN

## 2020-04-22 MED ORDER — PROPOFOL 10 MG/ML IV BOLUS
INTRAVENOUS | Status: DC | PRN
  Administered 2020-04-22: 100 mg via INTRAVENOUS
  Administered 2020-04-22 (×2): 30 mg via INTRAVENOUS

## 2020-04-22 MED ORDER — LIDOCAINE 2% (20 MG/ML) 5 ML SYRINGE
INTRAMUSCULAR | Status: DC | PRN
  Administered 2020-04-22: 60 mg via INTRAVENOUS
  Administered 2020-04-22 (×2): 40 mg via INTRAVENOUS

## 2020-04-22 MED ORDER — EPHEDRINE SULFATE-NACL 50-0.9 MG/10ML-% IV SOSY
PREFILLED_SYRINGE | INTRAVENOUS | Status: DC | PRN
  Administered 2020-04-22 (×2): 5 mg via INTRAVENOUS

## 2020-04-22 MED ORDER — LACTATED RINGERS IV SOLN: Freq: Once | INTRAVENOUS | Status: AC

## 2020-04-22 NOTE — Discharge Instructions (Addendum)
Colonoscopy Discharge Instructions  Read the instructions outlined below and refer to this sheet in the next few weeks. These discharge instructions provide you with general information on caring for yourself after you leave the hospital. Your doctor may also give you specific instructions. While your treatment has been planned according to the most current medical practices available, unavoidable complications occasionally occur.   ACTIVITY  You may resume your regular activity, but move at a slower pace for the next 24 hours.   Take frequent rest periods for the next 24 hours.   Walking will help get rid of the air and reduce the bloated feeling in your belly (abdomen).   No driving for 24 hours (because of the medicine (anesthesia) used during the test).    Do not sign any important legal documents or operate any machinery for 24 hours (because of the anesthesia used during the test).  NUTRITION  Drink plenty of fluids.   You may resume your normal diet as instructed by your doctor.   Begin with a light meal and progress to your normal diet. Heavy or fried foods are harder to digest and may make you feel sick to your stomach (nauseated).   Avoid alcoholic beverages for 24 hours or as instructed.  MEDICATIONS  You may resume your normal medications unless your doctor tells you otherwise.  WHAT YOU CAN EXPECT TODAY  Some feelings of bloating in the abdomen.   Passage of more gas than usual.   Spotting of blood in your stool or on the toilet paper.  IF YOU HAD POLYPS REMOVED DURING THE COLONOSCOPY:  No aspirin products for 7 days or as instructed.   No alcohol for 7 days or as instructed.   Eat a soft diet for the next 24 hours.  FINDING OUT THE RESULTS OF YOUR TEST Not all test results are available during your visit. If your test results are not back during the visit, make an appointment with your caregiver to find out the results. Do not assume everything is normal if  you have not heard from your caregiver or the medical facility. It is important for you to follow up on all of your test results.  SEEK IMMEDIATE MEDICAL ATTENTION IF:  You have more than a spotting of blood in your stool.   Your belly is swollen (abdominal distention).   You are nauseated or vomiting.   You have a temperature over 101.   You have abdominal pain or discomfort that is severe or gets worse throughout the day.   Your colonoscopy showed diverticulosis on the left side of your colon though I do not see any evidence of acute inflammation (diverticulitis).  You do have small internal hemorrhoids as well which could be contributing to your rectal bleeding.  I recommend increasing fiber in your diet or adding OTC Benefiber or Metamucil.  Please ensure that you are drinking at least 4 to 6 glasses of water daily.  I removed 2 small polyps.  Please await pathology results, my office will contact you by the end of the week.  Based on current guidelines I recommend we repeat colonoscopy for surveillance purposes in 5 years.  Follow-up with GI as needed.  If you continue to have rectal bleeding we can consider hemorrhoid banding in the outpatient clinic.  I hope you have a great rest of your week!  Elon Alas. Abbey Chatters, D.O. Gastroenterology and Hepatology [facility]   Colon Polyps  Polyps are tissue growths inside the  body. Polyps can grow in many places, including the large intestine (colon). A polyp may be a round bump or a mushroom-shaped growth. You could have one polyp or several. Most colon polyps are noncancerous (benign). However, some colon polyps can become cancerous over time. Finding and removing the polyps early can help prevent this. What are the causes? The exact cause of colon polyps is not known. What increases the risk? You are more likely to develop this condition if you:  Have a family history of colon cancer or colon polyps.  Are  older than 33 or older than 45 if you are African American.  Have inflammatory bowel disease, such as ulcerative colitis or Crohn's disease.  Have certain hereditary conditions, such as: ? Familial adenomatous polyposis. ? Lynch syndrome. ? Turcot syndrome. ? Peutz-Jeghers syndrome.  Are overweight.  Smoke cigarettes.  Do not get enough exercise.  Drink too much alcohol.  Eat a diet that is high in fat and red meat and low in fiber.  Had childhood cancer that was treated with abdominal radiation. What are the signs or symptoms? Most polyps do not cause symptoms. If you have symptoms, they may include:  Blood coming from your rectum when having a bowel movement.  Blood in your stool. The stool may look dark red or black.  Abdominal pain.  A change in bowel habits, such as constipation or diarrhea. How is this diagnosed? This condition is diagnosed with a colonoscopy. This is a procedure in which a lighted, flexible scope is inserted into the anus and then passed into the colon to examine the area. Polyps are sometimes found when a colonoscopy is done as part of routine cancer screening tests. How is this treated? Treatment for this condition involves removing any polyps that are found. Most polyps can be removed during a colonoscopy. Those polyps will then be tested for cancer. Additional treatment may be needed depending on the results of testing. Follow these instructions at home: Lifestyle  Maintain a healthy weight, or lose weight if recommended by your health care provider.  Exercise every day or as told by your health care provider.  Do not use any products that contain nicotine or tobacco, such as cigarettes and e-cigarettes. If you need help quitting, ask your health care provider.  If you drink alcohol, limit how much you have: ? 0-1 drink a day for women. ? 0-2 drinks a day for men.  Be aware of how much alcohol is in your drink. In the [place] one drink equals  one 12 oz bottle of beer (355 mL), one 5 oz glass of wine (148 mL), or one 1 oz shot of hard liquor (44 mL). Eating and drinking   Eat foods that are high in fiber, such as fruits, vegetables, and whole grains.  Eat foods that are high in calcium and vitamin D, such as milk, cheese, yogurt, eggs, liver, fish, and broccoli.  Limit foods that are high in fat, such as fried foods and desserts.  Limit the amount of red meat and processed meat you eat, such as hot dogs, sausage, bacon, and lunch meats. General instructions  Keep all follow-up visits as told by your health care provider. This is important. ? This includes having regularly scheduled colonoscopies. ? Talk to your health care provider about when you need a colonoscopy. Contact a health care provider if:  You have new or worsening bleeding during a bowel movement.  You have new or increased blood in your stool.  You have a change in bowel habits.  You lose weight for no known reason. Summary  Polyps are tissue growths inside the body. Polyps can grow in many places, including the colon.  Most colon polyps are noncancerous (benign), but some can become cancerous over time.  This condition is diagnosed with a colonoscopy.  Treatment for this condition involves removing any polyps that are found. Most polyps can be removed during a colonoscopy. This information is not intended to replace advice given to you by your health care provider. Make sure you discuss any questions you have with your health care provider. Document Revised: 11/04/2017 Document Reviewed: 11/04/2017 Elsevier Patient Education  [place].  Hemorrhoids Hemorrhoids are swollen veins in and around the rectum or anus. There are two types of hemorrhoids:  Internal hemorrhoids. These occur in the veins that are just inside the rectum. They may poke through to the outside and become irritated and painful.  External hemorrhoids. These occur in the  veins that are outside the anus and can be felt as a painful swelling or hard lump near the anus. Most hemorrhoids do not cause serious problems, and they can be managed with home treatments such as diet and lifestyle changes. If home treatments do not help the symptoms, procedures can be done to shrink or remove the hemorrhoids. What are the causes? This condition is caused by increased pressure in the anal area. This pressure may result from various things, including:  Constipation.  Straining to have a bowel movement.  Diarrhea.  Pregnancy.  Obesity.  Sitting for long periods of time.  Heavy lifting or other activity that causes you to strain.  Anal sex.  Riding a bike for a long period of time. What are the signs or symptoms? Symptoms of this condition include:  Pain.  Anal itching or irritation.  Rectal bleeding.  Leakage of stool (feces).  Anal swelling.  One or more lumps around the anus. How is this diagnosed? This condition can often be diagnosed through a visual exam. Other exams or tests may also be done, such as:  An exam that involves feeling the rectal area with a gloved hand (digital rectal exam).  An exam of the anal canal that is done using a small tube (anoscope).  A blood test, if you have lost a significant amount of blood.  A test to look inside the colon using a flexible tube with a camera on the end (sigmoidoscopy or colonoscopy). How is this treated? This condition can usually be treated at home. However, various procedures may be done if dietary changes, lifestyle changes, and other home treatments do not help your symptoms. These procedures can help make the hemorrhoids smaller or remove them completely. Some of these procedures involve surgery, and others do not. Common procedures include:  Rubber band ligation. Rubber bands are placed at the base of the hemorrhoids to cut off their blood supply.  Sclerotherapy. Medicine is injected into  the hemorrhoids to shrink them.  Infrared coagulation. A type of light energy is used to get rid of the hemorrhoids.  Hemorrhoidectomy surgery. The hemorrhoids are surgically removed, and the veins that supply them are tied off.  Stapled hemorrhoidopexy surgery. The surgeon staples the base of the hemorrhoid to the rectal wall. Follow these instructions at home: Eating and drinking   Eat foods that have a lot of fiber in them, such as whole grains, beans, nuts, fruits, and vegetables.  Ask your health care provider about taking products  that have added fiber (fiber supplements).  Reduce the amount of fat in your diet. You can do this by eating low-fat dairy products, eating less red meat, and avoiding processed foods.  Drink enough fluid to keep your urine pale yellow. Managing pain and swelling   Take warm sitz baths for 20 minutes, 3-4 times a day to ease pain and discomfort. You may do this in a bathtub or using a portable sitz bath that fits over the toilet.  If directed, apply ice to the affected area. Using ice packs between sitz baths may be helpful. ? Put ice in a plastic bag. ? Place a towel between your skin and the bag. ? Leave the ice on for 20 minutes, 2-3 times a day. General instructions  Take over-the-counter and prescription medicines only as told by your health care provider.  Use medicated creams or suppositories as told.  Get regular exercise. Ask your health care provider how much and what kind of exercise is best for you. In general, you should do moderate exercise for at least 30 minutes on most days of the week (150 minutes each week). This can include activities such as walking, biking, or yoga.  Go to the bathroom when you have the urge to have a bowel movement. Do not wait.  Avoid straining to have bowel movements.  Keep the anal area dry and clean. Use wet toilet paper or moist towelettes after a bowel movement.  Do not sit on the toilet for long  periods of time. This increases blood pooling and pain.  Keep all follow-up visits as told by your health care provider. This is important. Contact a health care provider if you have:  Increasing pain and swelling that are not controlled by treatment or medicine.  Difficulty having a bowel movement, or you are unable to have a bowel movement.  Pain or inflammation outside the area of the hemorrhoids. Get help right away if you have:  Uncontrolled bleeding from your rectum. Summary  Hemorrhoids are swollen veins in and around the rectum or anus.  Most hemorrhoids can be managed with home treatments such as diet and lifestyle changes.  Taking warm sitz baths can help ease pain and discomfort.  In severe cases, procedures or surgery can be done to shrink or remove the hemorrhoids. This information is not intended to replace advice given to you by your health care provider. Make sure you discuss any questions you have with your health care provider. Document Revised: 12/16/2018 Document Reviewed: 12/09/2017 Elsevier Patient Education  [place].  Diverticulosis  Diverticulosis is a condition that develops when small pouches (diverticula) form in the wall of the large intestine (colon). The colon is where water is absorbed and stool (feces) is formed. The pouches form when the inside layer of the colon pushes through weak spots in the outer layers of the colon. You may have a few pouches or many of them. The pouches usually do not cause problems unless they become inflamed or infected. When this happens, the condition is called diverticulitis. What are the causes? The cause of this condition is not known. What increases the risk? The following factors may make you more likely to develop this condition:  Being older than age 41. Your risk for this condition increases with age. Diverticulosis is rare among people younger than age 56. By age 15, many people have it.  Eating a  low-fiber diet.  Having frequent constipation.  Being overweight.  Not getting enough  exercise.  Smoking.  Taking over-the-counter pain medicines, like aspirin and ibuprofen.  Having a family history of diverticulosis. What are the signs or symptoms? In most people, there are no symptoms of this condition. If you do have symptoms, they may include:  Bloating.  Cramps in the abdomen.  Constipation or diarrhea.  Pain in the lower left side of the abdomen. How is this diagnosed? Because diverticulosis usually has no symptoms, it is most often diagnosed during an exam for other colon problems. The condition may be diagnosed by:  Using a flexible scope to examine the colon (colonoscopy).  Taking an X-ray of the colon after dye has been put into the colon (barium enema).  Having a CT scan. How is this treated? You may not need treatment for this condition. Your health care provider may recommend treatment to prevent problems. You may need treatment if you have symptoms or if you previously had diverticulitis. Treatment may include:  Eating a high-fiber diet.  Taking a fiber supplement.  Taking a live bacteria supplement (probiotic).  Taking medicine to relax your colon. Follow these instructions at home: Medicines  Take over-the-counter and prescription medicines only as told by your health care provider.  If told by your health care provider, take a fiber supplement or probiotic. Constipation prevention Your condition may cause constipation. To prevent or treat constipation, you may need to:  Drink enough fluid to keep your urine pale yellow.  Take over-the-counter or prescription medicines.  Eat foods that are high in fiber, such as beans, whole grains, and fresh fruits and vegetables.  Limit foods that are high in fat and processed sugars, such as fried or sweet foods.  General instructions  Try not to strain when you have a bowel movement.  Keep all  follow-up visits as told by your health care provider. This is important. Contact a health care provider if you:  Have pain in your abdomen.  Have bloating.  Have cramps.  Have not had a bowel movement in 3 days. Get help right away if:  Your pain gets worse.  Your bloating becomes very bad.  You have a fever or chills, and your symptoms suddenly get worse.  You vomit.  You have bowel movements that are bloody or black.  You have bleeding from your rectum. Summary  Diverticulosis is a condition that develops when small pouches (diverticula) form in the wall of the large intestine (colon).  You may have a few pouches or many of them.  This condition is most often diagnosed during an exam for other colon problems.  Treatment may include increasing the fiber in your diet, taking supplements, or taking medicines. This information is not intended to replace advice given to you by your health care provider. Make sure you discuss any questions you have with your health care provider. Document Revised: 02/16/2019 Document Reviewed: 02/16/2019 Elsevier Patient Education  [place].

## 2020-04-22 NOTE — Transfer of Care (Signed)
Immediate Anesthesia Transfer of Care Note  Patient: Julia [name]  Procedure(s) Performed: COLONOSCOPY WITH PROPOFOL (N/A ) POLYPECTOMY  Patient Location: Endoscopy Unit  Anesthesia Type:General  Level of Consciousness: awake and patient cooperative  Airway & Oxygen Therapy: Patient Spontanous Breathing  Post-op Assessment: Report given to RN and Post -op Vital signs reviewed and stable  Post vital signs: Reviewed and stable  Last Vitals:  Vitals Value Taken Time  BP 79/55 04/22/20 1139  Temp 36.6 C 04/22/20 1139  Pulse 80 04/22/20 1139  Resp 18 04/22/20 1139  SpO2 100 % 04/22/20 1139    Last Pain:  Vitals:   04/22/20 1139  TempSrc: Oral  PainSc:          Complications: No complications documented.

## 2020-04-22 NOTE — Anesthesia Preprocedure Evaluation (Addendum)
Anesthesia Evaluation  Patient identified by MRN, date of birth, ID band Patient awake    Reviewed: Allergy & Precautions, NPO status , Patient's Chart, lab work & pertinent test results  History of Anesthesia Complications Negative for: history of anesthetic complications  Airway Mallampati: II  TM Distance: >3 FB Neck ROM: Full    Dental  (+) Teeth Intact, Dental Advisory Given   Pulmonary former smoker,    Pulmonary exam normal breath sounds clear to auscultation       Cardiovascular Exercise Tolerance: Good hypertension, Pt. on medications Normal cardiovascular exam Rhythm:Regular Rate:Normal     Neuro/Psych PSYCHIATRIC DISORDERS Anxiety Depression    GI/Hepatic GERD  Medicated,(+)     substance abuse  alcohol use, H/o alcohol abuse   Endo/Other  negative endocrine ROS  Renal/GU negative Renal ROS  negative genitourinary   Musculoskeletal negative musculoskeletal ROS (+)   Abdominal   Peds  Hematology negative hematology ROS (+)   Anesthesia Other Findings   Reproductive/Obstetrics negative OB ROS                            Anesthesia Physical Anesthesia Plan  ASA: II  Anesthesia Plan: General   Post-op Pain Management:    Induction: Intravenous  PONV Risk Score and Plan: TIVA  Airway Management Planned: Nasal Cannula and Natural Airway  Additional Equipment:   Intra-op Plan:   Post-operative Plan:   Informed Consent: I have reviewed the patients History and Physical, chart, labs and discussed the procedure including the risks, benefits and alternatives for the proposed anesthesia with the patient or authorized representative who has indicated his/her understanding and acceptance.     Dental advisory given  Plan Discussed with: CRNA and Surgeon  Anesthesia Plan Comments:        Anesthesia Quick Evaluation

## 2020-04-22 NOTE — Anesthesia Postprocedure Evaluation (Signed)
Anesthesia Post Note  Patient: Julia [name]  Procedure(s) Performed: COLONOSCOPY WITH PROPOFOL (N/A ) POLYPECTOMY  Patient location during evaluation: Endoscopy Anesthesia Type: General Level of consciousness: awake Pain management: pain level controlled Vital Signs Assessment: post-procedure vital signs reviewed and stable Respiratory status: spontaneous breathing Cardiovascular status: blood pressure returned to baseline Postop Assessment: no headache and no apparent nausea or vomiting Anesthetic complications: no   No complications documented.   Last Vitals:  Vitals:   04/22/20 1106 04/22/20 1139  BP: 123/79 (!) 79/55  Pulse: 74 80  Resp: 17 18  Temp: 36.8 C 36.6 C  SpO2: 97% 100%    Last Pain:  Vitals:   04/22/20 1139  TempSrc: Oral  PainSc:                  [name]

## 2020-04-22 NOTE — Interval H&P Note (Signed)
History and Physical Interval Note:  04/22/2020 11:06 AM  Julia [name]  has presented today for surgery, with the diagnosis of rectal bleeding, change in bowels.  The various methods of treatment have been discussed with the patient and family. After consideration of risks, benefits and other options for treatment, the patient has consented to  Procedure(s) with comments: COLONOSCOPY WITH PROPOFOL (N/A) - 12:45pm as a surgical intervention.  The patient's history has been reviewed, patient examined, no change in status, stable for surgery.  I have reviewed the patient's chart and labs.  Questions were answered to the patient's satisfaction.     Eloise Harman

## 2020-04-22 NOTE — Op Note (Signed)
[facility] Patient Name: Julia [name] Procedure Date: 04/22/2020 10:53 AM MRN: [id number] Date of Birth: 03/16/1956 Attending MD: Elon Alas. Abbey Chatters DO CSN: [id number] Age: 64 Admit Type: Outpatient Procedure:                Colonoscopy Indications:              Abdominal pain in the left lower quadrant, Rectal                            bleeding Providers:                Elon Alas. Abbey Chatters, DO, Caprice Kluver, Crystal Page,                            Casimer Bilis, Technician, Aram Candela Referring MD:              Medicines:                See the Anesthesia note for documentation of the                            administered medications Complications:            No immediate complications. Estimated Blood Loss:     Estimated blood loss was minimal. Procedure:                Pre-Anesthesia Assessment:                           - The anesthesia plan was to use monitored                            anesthesia care (MAC).                           After obtaining informed consent, the colonoscope                            was passed under direct vision. Throughout the                            procedure, the patient's blood pressure, pulse, and                            oxygen saturations were monitored continuously. The                            [id number]-H190DL ([id number]) scope was introduced through                            the anus and advanced to the the cecum, identified                            by appendiceal orifice and ileocecal valve. The                            colonoscopy was performed without  difficulty. The                            patient tolerated the procedure well. The quality                            of the bowel preparation was evaluated using the                            BBPS [facility] Bowel Preparation Scale) with scores                            of: Right Colon = 3, Transverse Colon = 3 and Left                            Colon = 3 (entire  mucosa seen well with no residual                            staining, small fragments of stool or opaque                            liquid). The total BBPS score equals 9. Scope In: 11:20:52 AM Scope Out: 11:35:24 AM Scope Withdrawal Time: 0 hours 8 minutes 48 seconds  Total Procedure Duration: 0 hours 14 minutes 32 seconds  Findings:      The perianal and digital rectal examinations were normal.      Non-bleeding internal hemorrhoids were found during endoscopy.      Many small and large-mouthed diverticula were found in the sigmoid colon       and descending colon.      A 5 mm polyp was found in the ascending colon. The polyp was sessile.       The polyp was removed with a cold snare. Resection and retrieval were       complete.      A 2 mm polyp was found in the ascending colon. The polyp was sessile.       The polyp was removed with a jumbo cold forceps. Resection and retrieval       were complete.      The exam was otherwise without abnormality. Impression:               - Non-bleeding internal hemorrhoids.                           - Diverticulosis in the sigmoid colon and in the                            descending colon.                           - One 5 mm polyp in the ascending colon, removed                            with a cold snare. Resected and retrieved.                           -  One 2 mm polyp in the ascending colon, removed                            with a jumbo cold forceps. Resected and retrieved.                           - The examination was otherwise normal. Moderate Sedation:      Per Anesthesia Care Recommendation:           - Patient has a contact number available for                            emergencies. The signs and symptoms of potential                            delayed complications were discussed with the                            patient. Return to normal activities tomorrow.                            Written discharge instructions were  provided to the                            patient.                           - Resume previous diet.                           - Continue present medications.                           - Await pathology results.                           - Repeat colonoscopy in 5 years for surveillance.                           - Return to GI clinic PRN. Procedure Code(s):        --- Professional ---                           [phone], Colonoscopy, flexible; with removal of                            tumor(s), polyp(s), or other lesion(s) by snare                            technique                           45380, 59, Colonoscopy, flexible; with biopsy,                            single or multiple Diagnosis Code(s):        ---  Professional ---                           K64.8, Other hemorrhoids                           K63.5, Polyp of colon                           R10.32, Left lower quadrant pain                           K62.5, Hemorrhage of anus and rectum                           K57.30, Diverticulosis of large intestine without                            perforation or abscess without bleeding CPT copyright 2019 American Medical Association. All rights reserved. The codes documented in this report are preliminary and upon coder review may  be revised to meet current compliance requirements. Elon Alas. Abbey Chatters, [place] Abbey Chatters, DO 04/22/2020 11:38:47 AM This report has been signed electronically. Number of Addenda: 0

## 2020-04-23 LAB — SURGICAL PATHOLOGY

## 2020-04-24 ENCOUNTER — Other Ambulatory Visit: Payer: Self-pay

## 2020-04-24 ENCOUNTER — Telehealth (INDEPENDENT_AMBULATORY_CARE_PROVIDER_SITE_OTHER): Payer: 59 | Admitting: Family Medicine

## 2020-04-24 DIAGNOSIS — E7849 Other hyperlipidemia: Secondary | ICD-10-CM | POA: Diagnosis not present

## 2020-04-24 NOTE — Progress Notes (Signed)
   Subjective:    Patient ID: Julia [name], female    DOB: [birth year], 64 y.o.   MRN: [id number]  HPI  Patient calls to follow up on cholesterol med. Patient states she took her first pill on Monday with no problems Patient did not tolerate Crestor because side effects She has increased risk of heart disease and stroke She is willing to try other statin Pravastatin was started at a low dose to make sure that she could tolerate it. Virtual Visit via Video Note  I connected with MACKENSIE PILSON on 04/24/20 at 11:00 AM EDT by a video enabled telemedicine application and verified that I am speaking with the correct person using two identifiers.  Location: Patient: home Provider: office   I discussed the limitations of evaluation and management by telemedicine and the availability of in person appointments. The patient expressed understanding and agreed to proceed.  History of Present Illness:    Observations/Objective:   Assessment and Plan:   Follow Up Instructions:    I discussed the assessment and treatment plan with the patient. The patient was provided an opportunity to ask questions and all were answered. The patient agreed with the plan and demonstrated an understanding of the instructions.   The patient was advised to call back or seek an in-person evaluation if the symptoms worsen or if the condition fails to improve as anticipated.  I provided 20 minutes of non-face-to-face time during this encounter.      Review of Systems    Denies any chest tightness pressure pain shortness of breath Objective:   Physical Exam   Today's visit was via telephone Physical exam was not possible for this visit      Assessment & Plan:  Hyperlipidemia Did not tolerate Crestor So far tolerating pravastatin 2 days a week Will recheck labs again in 3 to 4 months Follow-up in 6 months

## 2020-04-25 ENCOUNTER — Encounter (HOSPITAL_COMMUNITY): Payer: Self-pay | Admitting: Internal Medicine

## 2020-07-15 ENCOUNTER — Other Ambulatory Visit: Payer: Self-pay | Admitting: Family Medicine

## 2020-07-19 ENCOUNTER — Other Ambulatory Visit: Payer: Self-pay | Admitting: *Deleted

## 2020-07-19 MED ORDER — PRAVASTATIN SODIUM 10 MG PO TABS: ORAL_TABLET | ORAL | 2 refills | Status: DC

## 2020-08-03 DIAGNOSIS — M5431 Sciatica, right side: Secondary | ICD-10-CM

## 2020-08-03 HISTORY — DX: Sciatica, right side: M54.31

## 2020-09-05 LAB — HEPATIC FUNCTION PANEL
ALT: 25 IU/L (ref 0–32)
AST: 29 IU/L (ref 0–40)
Albumin: 4.3 g/dL (ref 3.8–4.8)
Alkaline Phosphatase: 57 IU/L (ref 44–121)
Bilirubin Total: 0.4 mg/dL (ref 0.0–1.2)
Bilirubin, Direct: 0.13 mg/dL (ref 0.00–0.40)
Total Protein: 6.7 g/dL (ref 6.0–8.5)

## 2020-09-05 LAB — LIPID PANEL
Chol/HDL Ratio: 3 ratio (ref 0.0–4.4)
Cholesterol, Total: 215 mg/dL — ABNORMAL HIGH (ref 100–199)
HDL: 71 mg/dL (ref >39)
LDL Chol Calc (NIH): 125 mg/dL — ABNORMAL HIGH (ref 0–99)
Triglycerides: 110 mg/dL (ref 0–149)
VLDL Cholesterol Cal: 19 mg/dL (ref 5–40)

## 2020-09-20 ENCOUNTER — Encounter: Payer: Self-pay | Admitting: Family Medicine

## 2020-09-20 ENCOUNTER — Other Ambulatory Visit: Payer: Self-pay

## 2020-09-20 ENCOUNTER — Ambulatory Visit (INDEPENDENT_AMBULATORY_CARE_PROVIDER_SITE_OTHER): Payer: 59 | Admitting: Family Medicine

## 2020-09-20 VITALS — BP 124/76 | HR 83 | Temp 97.0°F | Ht 62.0 in | Wt 166.0 lb

## 2020-09-20 DIAGNOSIS — I700 Atherosclerosis of aorta: Secondary | ICD-10-CM | POA: Diagnosis not present

## 2020-09-20 DIAGNOSIS — R252 Cramp and spasm: Secondary | ICD-10-CM

## 2020-09-20 DIAGNOSIS — I10 Essential (primary) hypertension: Secondary | ICD-10-CM

## 2020-09-20 DIAGNOSIS — E7849 Other hyperlipidemia: Secondary | ICD-10-CM

## 2020-09-20 DIAGNOSIS — E876 Hypokalemia: Secondary | ICD-10-CM

## 2020-09-20 MED ORDER — LISINOPRIL-HYDROCHLOROTHIAZIDE 20-25 MG PO TABS
1.0000 | ORAL_TABLET | Freq: Every morning | ORAL | 1 refills | Status: DC
Start: 2020-09-20 — End: 2021-01-31

## 2020-09-20 MED ORDER — PANTOPRAZOLE SODIUM 40 MG PO TBEC
40.0000 mg | DELAYED_RELEASE_TABLET | Freq: Every day | ORAL | 1 refills | Status: DC
Start: 2020-09-20 — End: 2021-01-31

## 2020-09-20 MED ORDER — PRAVASTATIN SODIUM 10 MG PO TABS
ORAL_TABLET | ORAL | 1 refills | Status: DC
Start: 2020-09-20 — End: 2021-01-31

## 2020-09-20 MED ORDER — POTASSIUM CHLORIDE CRYS ER 10 MEQ PO TBCR
10.0000 meq | EXTENDED_RELEASE_TABLET | Freq: Two times a day (BID) | ORAL | 1 refills | Status: DC
Start: 2020-09-20 — End: 2020-09-23

## 2020-09-20 NOTE — Progress Notes (Signed)
Subjective:    Patient ID: Julia [name], female    DOB: [birth year], 65 y.o.   MRN: [id number]  HPImed check up. 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 Having cramps in hips, legs and feet for 3 months. Started around same time she started pravastatin.  Patient has not done her booster yet of her COVID vaccine or her flu shot I have encouraged her to get both completed Primary hypertension - Plan: Basic Metabolic Panel (BMET), Magnesium, TSH, T4, free  Aortic atherosclerosis ([place]) - Plan: Basic Metabolic Panel (BMET), Magnesium, TSH, T4, free  Other hyperlipidemia - Plan: Basic Metabolic Panel (BMET), Magnesium, TSH, T4, free  Muscle cramps - Plan: Basic Metabolic Panel (BMET), Magnesium, TSH, T4, free  Patient has been having a lot of leg cramps been going on over the past several weeks hurts with certain movements hurts with walking up and down at night.  Causes severe cramps.  She wondered at first if it could be her cholesterol medicine but after discussion more likely related to her inactivity.  But I did tell her we would be open minded that if this continues may have to try a trial off of pain medicine   Review of Systems  Constitutional: Negative for activity change and appetite change.  HENT: Negative for congestion and rhinorrhea.   Respiratory: Negative for cough and shortness of breath.   Cardiovascular: Negative for chest pain and leg swelling.  Gastrointestinal: Negative for abdominal pain, nausea and vomiting.  Musculoskeletal: Positive for myalgias.  Skin: Negative for color change.  Neurological: Negative for dizziness and weakness.  Psychiatric/Behavioral: Negative for agitation and  confusion.       Objective:   Physical Exam Vitals reviewed.  Constitutional:      General: She is not in acute distress.    Appearance: She is well-nourished.  HENT:     Head: Normocephalic.  Cardiovascular:     Rate and Rhythm: Normal rate and regular rhythm.     Heart sounds: Normal heart sounds. No murmur heard.   Pulmonary:     Effort: Pulmonary effort is normal.     Breath sounds: Normal breath sounds.  Musculoskeletal:        General: No edema.  Lymphadenopathy:     Cervical: No cervical adenopathy.  Neurological:     Mental Status: She is alert.  Psychiatric:        Behavior: Behavior normal.           Assessment & Plan:  1. Primary hypertension Blood pressure good control continue current measures.  Await the results of the electrolytes - Basic Metabolic Panel (BMET) - Magnesium - TSH - T4, free  2. Aortic atherosclerosis ([facility]) History aortic atherosclerosis I do not feel that her cholesterol medicine is causing her leg cramps I would recommend that she continue taking this twice per week - Basic Metabolic Panel (BMET) - Magnesium - TSH - T4, free  3. Other hyperlipidemia Cholesterol does show improvement compared to where it was continue to medication to lessen the risk of heart attack - Basic Metabolic Panel (BMET) - Magnesium - TSH - T4, free  4. Muscle cramps Walking on a regular basis 3-4 times a  week also stretching exercises also lab work ordered.  Await the results. - Basic Metabolic Panel (BMET) - Magnesium - TSH - T4, free

## 2020-09-20 NOTE — Patient Instructions (Signed)
Vit B complex  Co Q 10 one daily  Muscle stretches in the evening    and do labs soon please

## 2020-09-21 LAB — BASIC METABOLIC PANEL
BUN/Creatinine Ratio: 19 (ref 12–28)
BUN: 14 mg/dL (ref 8–27)
CO2: 24 mmol/L (ref 20–29)
Calcium: 9.3 mg/dL (ref 8.7–10.3)
Chloride: 97 mmol/L (ref 96–106)
Creatinine, Ser: 0.74 mg/dL (ref 0.57–1.00)
GFR calc Af Amer: 100 mL/min/{1.73_m2} (ref >59)
GFR calc non Af Amer: 86 mL/min/{1.73_m2} (ref >59)
Glucose: 93 mg/dL (ref 65–99)
Potassium: 3.4 mmol/L — ABNORMAL LOW (ref 3.5–5.2)
Sodium: 137 mmol/L (ref 134–144)

## 2020-09-21 LAB — MAGNESIUM: Magnesium: 1.9 mg/dL (ref 1.6–2.3)

## 2020-09-21 LAB — T4, FREE: Free T4: 1.47 ng/dL (ref 0.82–1.77)

## 2020-09-21 LAB — TSH: TSH: 1.57 u[IU]/mL (ref 0.450–4.500)

## 2020-09-23 MED ORDER — POTASSIUM CHLORIDE CRYS ER 10 MEQ PO TBCR: EXTENDED_RELEASE_TABLET | ORAL | 0 refills | Status: DC

## 2020-11-26 ENCOUNTER — Other Ambulatory Visit: Payer: Self-pay | Admitting: Family Medicine

## 2021-01-31 ENCOUNTER — Ambulatory Visit (INDEPENDENT_AMBULATORY_CARE_PROVIDER_SITE_OTHER): Payer: 59 | Admitting: Family Medicine

## 2021-01-31 ENCOUNTER — Other Ambulatory Visit: Payer: Self-pay

## 2021-01-31 ENCOUNTER — Encounter: Payer: Self-pay | Admitting: Family Medicine

## 2021-01-31 VITALS — BP 112/78 | HR 73 | Temp 96.6°F | Ht 62.0 in | Wt 177.0 lb

## 2021-01-31 DIAGNOSIS — M5431 Sciatica, right side: Secondary | ICD-10-CM | POA: Diagnosis not present

## 2021-01-31 DIAGNOSIS — E7849 Other hyperlipidemia: Secondary | ICD-10-CM

## 2021-01-31 DIAGNOSIS — G4709 Other insomnia: Secondary | ICD-10-CM

## 2021-01-31 DIAGNOSIS — R300 Dysuria: Secondary | ICD-10-CM | POA: Diagnosis not present

## 2021-01-31 DIAGNOSIS — I10 Essential (primary) hypertension: Secondary | ICD-10-CM

## 2021-01-31 LAB — POCT URINALYSIS DIP (MANUAL ENTRY)
Bilirubin, UA: NEGATIVE
Blood, UA: NEGATIVE
Glucose, UA: NEGATIVE mg/dL
Ketones, POC UA: NEGATIVE mg/dL
Leukocytes, UA: NEGATIVE
Nitrite, UA: NEGATIVE
Protein Ur, POC: NEGATIVE mg/dL
Spec Grav, UA: 1.01 (ref 1.010–1.025)
Urobilinogen, UA: 0.2 E.U./dL
pH, UA: 6 (ref 5.0–8.0)

## 2021-01-31 MED ORDER — PRAVASTATIN SODIUM 10 MG PO TABS
ORAL_TABLET | ORAL | 1 refills | Status: DC
Start: 2021-01-31 — End: 2021-09-23

## 2021-01-31 MED ORDER — ALBUTEROL SULFATE HFA 108 (90 BASE) MCG/ACT IN AERS: 2.0000 | INHALATION_SPRAY | Freq: Four times a day (QID) | RESPIRATORY_TRACT | 5 refills | Status: DC | PRN

## 2021-01-31 MED ORDER — PANTOPRAZOLE SODIUM 40 MG PO TBEC
40.0000 mg | DELAYED_RELEASE_TABLET | Freq: Every day | ORAL | 1 refills | Status: DC
Start: 2021-01-31 — End: 2021-03-25

## 2021-01-31 MED ORDER — POTASSIUM CHLORIDE CRYS ER 10 MEQ PO TBCR
EXTENDED_RELEASE_TABLET | ORAL | 1 refills | Status: DC
Start: 2021-01-31 — End: 2021-09-22

## 2021-01-31 MED ORDER — LISINOPRIL-HYDROCHLOROTHIAZIDE 20-25 MG PO TABS
1.0000 | ORAL_TABLET | Freq: Every morning | ORAL | 1 refills | Status: DC
Start: 2021-01-31 — End: 2021-09-22

## 2021-01-31 MED ORDER — HYDROXYZINE HCL 25 MG PO TABS
ORAL_TABLET | ORAL | 1 refills | Status: DC
Start: 2021-01-31 — End: 2022-09-07

## 2021-01-31 NOTE — Patient Instructions (Addendum)
https://www.nhlbi.nih.gov/files/docs/public/heart/dash_brief.pdf">  DASH Eating Plan DASH stands for Dietary Approaches to Stop Hypertension. The DASH eating plan is a healthy eating plan that has been shown to: Reduce high blood pressure (hypertension). Reduce your risk for type 2 diabetes, heart disease, and stroke. Help with weight loss. What are tips for following this plan? Reading food labels Check food labels for the amount of salt (sodium) per serving. Choose foods with less than 5 percent of the Daily Value of sodium. Generally, foods with less than 300 milligrams (mg) of sodium per serving fit into this eating plan. To find whole grains, look for the word "whole" as the first word in the ingredient list. Shopping Buy products labeled as "low-sodium" or "no salt added." Buy fresh foods. Avoid canned foods and pre-made or frozen meals. Cooking Avoid adding salt when cooking. Use salt-free seasonings or herbs instead of table salt or sea salt. Check with your health care provider or pharmacist before using salt substitutes. Do not fry foods. Cook foods using healthy methods such as baking, boiling, grilling, roasting, and broiling instead. Cook with heart-healthy oils, such as olive, canola, avocado, soybean, or sunflower oil. Meal planning  Eat a balanced diet that includes: 4 or more servings of fruits and 4 or more servings of vegetables each day. Try to fill one-half of your plate with fruits and vegetables. 6-8 servings of whole grains each day. Less than 6 oz (170 g) of lean meat, poultry, or fish each day. A 3-oz (85-g) serving of meat is about the same size as a deck of cards. One egg equals 1 oz (28 g). 2-3 servings of low-fat dairy each day. One serving is 1 cup (237 mL). 1 serving of nuts, seeds, or beans 5 times each week. 2-3 servings of heart-healthy fats. Healthy fats called omega-3 fatty acids are found in foods such as walnuts, flaxseeds, fortified milks, and eggs.  These fats are also found in cold-water fish, such as sardines, salmon, and mackerel. Limit how much you eat of: Canned or prepackaged foods. Food that is high in trans fat, such as some fried foods. Food that is high in saturated fat, such as fatty meat. Desserts and other sweets, sugary drinks, and other foods with added sugar. Full-fat dairy products. Do not salt foods before eating. Do not eat more than 4 egg yolks a week. Try to eat at least 2 vegetarian meals a week. Eat more home-cooked food and less restaurant, buffet, and fast food.  Lifestyle When eating at a restaurant, ask that your food be prepared with less salt or no salt, if possible. If you drink alcohol: Limit how much you use to: 0-1 drink a day for women who are not pregnant. 0-2 drinks a day for men. Be aware of how much alcohol is in your drink. In the [place] one drink equals one 12 oz bottle of beer (355 mL), one 5 oz glass of wine (148 mL), or one 1 oz glass of hard liquor (44 mL). General information Avoid eating more than 2,300 mg of salt a day. If you have hypertension, you may need to reduce your sodium intake to 1,500 mg a day. Work with your health care provider to maintain a healthy body weight or to lose weight. Ask what an ideal weight is for you. Get at least 30 minutes of exercise that causes your heart to beat faster (aerobic exercise) most days of the week. Activities may include walking, swimming, or biking. Work with your health care provider   or dietitian to adjust your eating plan to your individual calorie needs. What foods should I eat? Fruits All fresh, dried, or frozen fruit. Canned fruit in natural juice (without addedsugar). Vegetables Fresh or frozen vegetables (raw, steamed, roasted, or grilled). Low-sodium or reduced-sodium tomato and vegetable juice. Low-sodium or reduced-sodium tomatosauce and tomato paste. Low-sodium or reduced-sodium canned vegetables. Grains Whole-grain or  whole-wheat bread. Whole-grain or whole-wheat pasta. Brown rice. Oatmeal. Quinoa. Bulgur. Whole-grain and low-sodium cereals. Pita bread.Low-fat, low-sodium crackers. Whole-wheat flour tortillas. Meats and other proteins Skinless chicken or turkey. Ground chicken or turkey. Pork with fat trimmed off. Fish and seafood. Egg whites. Dried beans, peas, or lentils. Unsalted nuts, nut butters, and seeds. Unsalted canned beans. Lean cuts of beef with fat trimmed off. Low-sodium, lean precooked or cured meat, such as sausages or meatloaves. Dairy Low-fat (1%) or fat-free (skim) milk. Reduced-fat, low-fat, or fat-free cheeses. Nonfat, low-sodium ricotta or cottage cheese. Low-fat or nonfatyogurt. Low-fat, low-sodium cheese. Fats and oils Soft margarine without trans fats. Vegetable oil. Reduced-fat, low-fat, or light mayonnaise and salad dressings (reduced-sodium). Canola, safflower, olive, avocado, soybean, andsunflower oils. Avocado. Seasonings and condiments Herbs. Spices. Seasoning mixes without salt. Other foods Unsalted popcorn and pretzels. Fat-free sweets. The items listed above may not be a complete list of foods and beverages you can eat. Contact a dietitian for more information. What foods should I avoid? Fruits Canned fruit in a light or heavy syrup. Fried fruit. Fruit in cream or buttersauce. Vegetables Creamed or fried vegetables. Vegetables in a cheese sauce. Regular canned vegetables (not low-sodium or reduced-sodium). Regular canned tomato sauce and paste (not low-sodium or reduced-sodium). Regular tomato and vegetable juice(not low-sodium or reduced-sodium). Pickles. Olives. Grains Baked goods made with fat, such as croissants, muffins, or some breads. Drypasta or rice meal packs. Meats and other proteins Fatty cuts of meat. Ribs. Fried meat. Bacon. Bologna, salami, and other precooked or cured meats, such as sausages or meat loaves. Fat from the back of a pig (fatback). Bratwurst.  Salted nuts and seeds. Canned beans with added salt. Canned orsmoked fish. Whole eggs or egg yolks. Chicken or turkey with skin. Dairy Whole or 2% milk, cream, and half-and-half. Whole or full-fat cream cheese. Whole-fat or sweetened yogurt. Full-fat cheese. Nondairy creamers. Whippedtoppings. Processed cheese and cheese spreads. Fats and oils Butter. Stick margarine. Lard. Shortening. Ghee. Bacon fat. Tropical oils, suchas coconut, palm kernel, or palm oil. Seasonings and condiments Onion salt, garlic salt, seasoned salt, table salt, and sea salt. Worcestershire sauce. Tartar sauce. Barbecue sauce. Teriyaki sauce. Soy sauce, including reduced-sodium. Steak sauce. Canned and packaged gravies. Fish sauce. Oyster sauce. Cocktail sauce. Store-bought horseradish. Ketchup. Mustard. Meat flavorings and tenderizers. Bouillon cubes. Hot sauces. Pre-made or packaged marinades. Pre-made or packaged taco seasonings. Relishes. Regular saladdressings. Other foods Salted popcorn and pretzels. The items listed above may not be a complete list of foods and beverages you should avoid. Contact a dietitian for more information. Where to find more information National Heart, Lung, and Blood Institute: www.nhlbi.nih.gov American Heart Association: www.heart.org Academy of Nutrition and Dietetics: www.eatright.org National Kidney Foundation: www.kidney.org Summary The DASH eating plan is a healthy eating plan that has been shown to reduce high blood pressure (hypertension). It may also reduce your risk for type 2 diabetes, heart disease, and stroke. When on the DASH eating plan, aim to eat more fresh fruits and vegetables, whole grains, lean proteins, low-fat dairy, and heart-healthy fats. With the DASH eating plan, you should limit salt (sodium) intake to 2,300   mg a day. If you have hypertension, you may need to reduce your sodium intake to 1,500 mg a day. Work with your health care provider or dietitian to adjust  your eating plan to your individual calorie needs. This information is not intended to replace advice given to you by your health care provider. Make sure you discuss any questions you have with your healthcare provider. Document Revised: 06/23/2019 Document Reviewed: 06/23/2019 Elsevier Patient Education  2022 [employer]. Sciatica  Sciatica is pain, numbness, weakness, or tingling along the path of the sciatic nerve. The sciatic nerve starts in the lower back and runs down the back of each leg. The nerve controls the muscles in the lower leg and in the back of the knee. It also provides feeling (sensation) to the back of the thigh, the lower leg, and the sole of the foot. Sciatica is a symptom of another medical condition that pinches or puts pressure on thesciatic nerve. Sciatica most often only affects one side of the body. Sciatica usually goes away on its own or with treatment. In some cases, sciatica may come back (recur). What are the causes? This condition is caused by pressure on the sciatic nerve or pinching of the nerve. This may be the result of: A disk in between the bones of the spine bulging out too far (herniated disk). Age-related changes in the spinal disks. A pain disorder that affects a muscle in the buttock. Extra bone growth near the sciatic nerve. A break (fracture) of the pelvis. Pregnancy. Tumor. This is rare. What increases the risk? The following factors may make you more likely to develop this condition: Playing sports that place pressure or stress on the spine. Having poor strength and flexibility. A history of back injury or surgery. Sitting for long periods of time. Doing activities that involve repetitive bending or lifting. Obesity. What are the signs or symptoms? Symptoms can vary from mild to very severe, and they may include: Any of these problems in the lower back, leg, hip, or buttock: Mild tingling, numbness, or dull aches. Burning  sensations. Sharp pains. Numbness in the back of the calf or the sole of the foot. Leg weakness. Severe back pain that makes movement difficult. Symptoms may get worse when you cough, sneeze, or laugh, or when you sit orstand for long periods of time. How is this diagnosed? This condition may be diagnosed based on: Your symptoms and medical history. A physical exam. Blood tests. Imaging tests, such as: X-rays. MRI. CT scan. How is this treated? In many cases, this condition improves on its own without treatment. However, treatment may include: Reducing or modifying physical activity. Exercising and stretching. Icing and applying heat to the affected area. Medicines that help to: Relieve pain and swelling. Relax your muscles. Injections of medicines that help to relieve pain, irritation, and inflammation around the sciatic nerve (steroids). Surgery. Follow these instructions at home: Medicines Take over-the-counter and prescription medicines only as told by your health care provider. Ask your health care provider if the medicine prescribed to you: Requires you to avoid driving or using heavy machinery. Can cause constipation. You may need to take these actions to prevent or treat constipation: Drink enough fluid to keep your urine pale yellow. Take over-the-counter or prescription medicines. Eat foods that are high in fiber, such as beans, whole grains, and fresh fruits and vegetables. Limit foods that are high in fat and processed sugars, such as fried or sweet foods. Managing pain  If directed, put ice on the affected area. Put ice in a plastic bag. Place a towel between your skin and the bag. Leave the ice on for 20 minutes, 2-3 times a day. If directed, apply heat to the affected area. Use the heat source that your health care provider recommends, such as a moist heat pack or a heating pad. Place a towel between your skin and the heat source. Leave the heat on for  20-30 minutes. Remove the heat if your skin turns bright red. This is especially important if you are unable to feel pain, heat, or cold. You may have a greater risk of getting burned. Activity  Return to your normal activities as told by your health care provider. Ask your health care provider what activities are safe for you. Avoid activities that make your symptoms worse. Take brief periods of rest throughout the day. When you rest for longer periods, mix in some mild activity or stretching between periods of rest. This will help to prevent stiffness and pain. Avoid sitting for long periods of time without moving. Get up and move around at least one time each hour. Exercise and stretch regularly, as told by your health care provider. Do not lift anything that is heavier than 10 lb (4.5 kg) while you have symptoms of sciatica. When you do not have symptoms, you should still avoid heavy lifting, especially repetitive heavy lifting. When you lift objects, always use proper lifting technique, which includes: Bending your knees. Keeping the load close to your body. Avoiding twisting.  General instructions Maintain a healthy weight. Excess weight puts extra stress on your back. Wear supportive, comfortable shoes. Avoid wearing high heels. Avoid sleeping on a mattress that is too soft or too hard. A mattress that is firm enough to support your back when you sleep may help to reduce your pain. Keep all follow-up visits as told by your health care provider. This is important. Contact a health care provider if: You have pain that: Wakes you up when you are sleeping. Gets worse when you lie down. Is worse than you have experienced in the past. Lasts longer than 4 weeks. You have an unexplained weight loss. Get help right away if: You are not able to control when you urinate or have bowel movements (incontinence). You have: Weakness in your lower back, pelvis, buttocks, or legs that gets  worse. Redness or swelling of your back. A burning sensation when you urinate. Summary Sciatica is pain, numbness, weakness, or tingling along the path of the sciatic nerve. This condition is caused by pressure on the sciatic nerve or pinching of the nerve. Sciatica can cause pain, numbness, or tingling in the lower back, legs, hips, and buttocks. Treatment often includes rest, exercise, medicines, and applying ice or heat. This information is not intended to replace advice given to you by your health care provider. Make sure you discuss any questions you have with your healthcare provider. Document Revised: 08/08/2018 Document Reviewed: 08/08/2018 Elsevier Patient Education  [employer].

## 2021-01-31 NOTE — Progress Notes (Signed)
   Subjective:    Patient ID: Julia [name], female    DOB: 1956/03/03, 65 y.o.   MRN: [id number]  R leg pain - R buttock to R thigh pain x 3 to 4 weeks  Burning pain discomfort down the leg present for several days Denies any injury to it States not as active as she used to be Does a lot of sewing as a hobby and is quite good at it Also here for follow-up regarding her blood pressure States been taking her medicine denies any setbacks Hypertension This is a chronic problem. Treatments tried: zestoric.  Medication refills     Review of Systems     Objective:   Physical Exam  General-in no acute distress Eyes-no discharge Lungs-respiratory rate normal, CTA CV-no murmurs,RRR Extremities skin warm dry no edema Neuro grossly normal Behavior normal, alert  She can walk on her toes and her heels positive straight leg raise on the right     Assessment & Plan:  1. Burning with urination Urine dipstick looks good we will do urine culture await results - POCT urinalysis dipstick - Urine Culture  2. Sciatica, right side For now patient will use Tylenol and ibuprofen if it gets progressively worse consider gabapentin hold off on narcotics - Basic metabolic panel - Lipid panel - Hepatic function panel  3. Primary hypertension Blood pressure good control continue current measures - Basic metabolic panel - Lipid panel - Hepatic function panel  4. Other insomnia Moderate insomnia issues back off on caffeine's is much as possible this should help - Basic metabolic panel - Lipid panel - Hepatic function panel  5. Other hyperlipidemia Hyperlipidemia continue cholesterol medicine labs ordered importance of checking labs discussed - Basic metabolic panel - Lipid panel - Hepatic function panel  Follow-up 6 months sooner if problems Follow-up 3 months for sciatica

## 2021-02-04 ENCOUNTER — Other Ambulatory Visit: Payer: Self-pay | Admitting: *Deleted

## 2021-02-04 ENCOUNTER — Telehealth: Payer: Self-pay | Admitting: *Deleted

## 2021-02-04 LAB — URINE CULTURE

## 2021-02-04 LAB — SPECIMEN STATUS REPORT

## 2021-02-04 MED ORDER — CIPROFLOXACIN HCL 250 MG PO TABS: 250.0000 mg | ORAL_TABLET | Freq: Two times a day (BID) | ORAL | 0 refills | Status: DC

## 2021-02-04 NOTE — Telephone Encounter (Signed)
Discussed with pt and med sent to pharm.  

## 2021-02-04 NOTE — Telephone Encounter (Signed)
Please see result note from today.

## 2021-02-04 NOTE — Telephone Encounter (Signed)
Cipro 250 mg 1 twice daily for 5 days if ongoing troubles notify us  If patient continues to have itching and burning despite antibiotics may need gynecologic evaluation

## 2021-03-25 ENCOUNTER — Other Ambulatory Visit: Payer: Self-pay | Admitting: *Deleted

## 2021-03-25 MED ORDER — PANTOPRAZOLE SODIUM 40 MG PO TBEC
40.0000 mg | DELAYED_RELEASE_TABLET | Freq: Every day | ORAL | 0 refills | Status: DC
Start: 2021-03-25 — End: 2021-05-05

## 2021-05-02 LAB — HEPATIC FUNCTION PANEL
ALT: 32 IU/L (ref 0–32)
AST: 33 IU/L (ref 0–40)
Albumin: 4.4 g/dL (ref 3.8–4.8)
Alkaline Phosphatase: 53 IU/L (ref 44–121)
Bilirubin Total: 0.4 mg/dL (ref 0.0–1.2)
Bilirubin, Direct: <0.1 mg/dL (ref 0.00–0.40)
Total Protein: 6.9 g/dL (ref 6.0–8.5)

## 2021-05-02 LAB — LIPID PANEL
Chol/HDL Ratio: 3.3 ratio (ref 0.0–4.4)
Cholesterol, Total: 214 mg/dL — ABNORMAL HIGH (ref 100–199)
HDL: 64 mg/dL (ref >39)
LDL Chol Calc (NIH): 117 mg/dL — ABNORMAL HIGH (ref 0–99)
Triglycerides: 189 mg/dL — ABNORMAL HIGH (ref 0–149)
VLDL Cholesterol Cal: 33 mg/dL (ref 5–40)

## 2021-05-02 LAB — BASIC METABOLIC PANEL
BUN/Creatinine Ratio: 12 (ref 12–28)
BUN: 11 mg/dL (ref 8–27)
CO2: 25 mmol/L (ref 20–29)
Calcium: 9.5 mg/dL (ref 8.7–10.3)
Chloride: 102 mmol/L (ref 96–106)
Creatinine, Ser: 0.93 mg/dL (ref 0.57–1.00)
Glucose: 96 mg/dL (ref 70–99)
Potassium: 4 mmol/L (ref 3.5–5.2)
Sodium: 144 mmol/L (ref 134–144)
eGFR: 69 mL/min/{1.73_m2} (ref >59)

## 2021-05-05 ENCOUNTER — Encounter: Payer: Self-pay | Admitting: Family Medicine

## 2021-05-05 ENCOUNTER — Other Ambulatory Visit: Payer: Self-pay

## 2021-05-05 ENCOUNTER — Ambulatory Visit (INDEPENDENT_AMBULATORY_CARE_PROVIDER_SITE_OTHER): Payer: 59 | Admitting: Family Medicine

## 2021-05-05 VITALS — BP 128/68 | HR 70 | Temp 96.1°F | Ht 62.0 in | Wt 181.0 lb

## 2021-05-05 DIAGNOSIS — I10 Essential (primary) hypertension: Secondary | ICD-10-CM | POA: Diagnosis not present

## 2021-05-05 DIAGNOSIS — E7849 Other hyperlipidemia: Secondary | ICD-10-CM | POA: Diagnosis not present

## 2021-05-05 DIAGNOSIS — Z23 Encounter for immunization: Secondary | ICD-10-CM | POA: Diagnosis not present

## 2021-05-05 MED ORDER — PANTOPRAZOLE SODIUM 40 MG PO TBEC
40.0000 mg | DELAYED_RELEASE_TABLET | Freq: Every day | ORAL | 1 refills | Status: DC
Start: 2021-05-05 — End: 2022-03-31

## 2021-05-05 NOTE — Patient Instructions (Signed)

## 2021-05-05 NOTE — Progress Notes (Signed)
   Subjective:    Patient ID: Stefanie Libel, female    DOB: May 17, 1956, 65 y.o.   MRN: [id number]  HPI Essential hypertension follow up  Lab results Flu vaccines Patient for blood pressure check up.  The patient does have hypertension.    Medication compliance-good compliance  Blood pressure control recently-does not check blood pressure regularly  Dietary compliance-watches diet trying to be healthy  Patient here for follow-up regarding cholesterol.    Diet-trying to be healthy  Compliance with medicine-relates compliance with medicine  Side effects-denies side effects currently  Activity-plans on increasing walking  Regular lab work regarding lipid and liver was checked and if needing additional labs was appropriately ordered  Review of Systems     Objective:   Physical Exam General-in no acute distress Eyes-no discharge Lungs-respiratory rate normal, CTA CV-no murmurs,RRR Extremities skin warm dry no edema Neuro grossly normal Behavior normal, alert  Liver enzymes look good Electrolytes look good kidney function looks good Cholesterol mildly elevated but tolerating low-dose of medicine     Assessment & Plan:  Weight control discussed patient is working hard on eating healthier and will be working on fitting and walking Walking program printout given Blood pressure good control continue current medication Mild obesity patient is going to work hard on diet and exercise Cholesterol still mildly but tolerating the low-dose of medicine Continue this currently Follow-up within 6 months

## 2021-05-29 ENCOUNTER — Ambulatory Visit: Payer: 59 | Admitting: Internal Medicine

## 2021-05-30 ENCOUNTER — Other Ambulatory Visit: Payer: Self-pay

## 2021-05-30 ENCOUNTER — Encounter: Payer: Self-pay | Admitting: Gastroenterology

## 2021-05-30 ENCOUNTER — Ambulatory Visit (INDEPENDENT_AMBULATORY_CARE_PROVIDER_SITE_OTHER): Payer: 59 | Admitting: Gastroenterology

## 2021-05-30 DIAGNOSIS — R1011 Right upper quadrant pain: Secondary | ICD-10-CM | POA: Diagnosis not present

## 2021-05-30 NOTE — Patient Instructions (Addendum)
Ultrasound of gallbladder as scheduled.  Have LFTs 6-8 hours after next attack/significant abdominal pain.

## 2021-05-30 NOTE — Addendum Note (Signed)
Addended by: Mahala Menghini on: 05/30/2021 08:56 AM   Modules accepted: Orders

## 2021-05-30 NOTE — Progress Notes (Addendum)
Primary Care Physician: Kathyrn Drown, MD  Primary Gastroenterologist:  Elon Alas. Abbey Chatters, DO   Chief Complaint  Patient presents with   Abdominal Pain    RUQ, worse with certain foods, certain things makes pain worse/burning   Nausea    With vomiting at times    HPI: Julia [name] is a 65 y.o. female here for further evaluation of RUQ pain.  Postprandial RUQ pain, progressively worse over the last months. Can no longer control with diet. Similar to gallbladder attacks when pregnant. Symptoms may last for a couple of days before settling down. Worse with fried foods. Have been vomiting with the worse attacks. Sometimes happens with salads, cabbage too though. No heartburn on pantoprazole. No dysphagia. BM regular with miralax. Certain foods will trigger ruq pain and diarrhea. Usually stays constipated. No melena, brbpr. Taking ibuprofen for the abdominal pain but was not taking very much before pain started.   CT abdomen and pelvis with contrast August 2021 gallbladder normal.  Liver unremarkable.  Diverticulosis.  No acute findings to explain abdominal pain.  Labs from September 2022: LFTs normal   03/2020: 163 pounds 01/2021: 177 pounds 05/2021: 181 pounds   Colonoscopy 04/2020: -Non-bleeding internal hemorrhoids. - Diverticulosis in the sigmoid colon and in the descending colon. - One 5 mm polyp in the ascending colon, removed with a cold snare. Resected and retrieved. - One 2 mm polyp in the ascending colon, removed with a jumbo cold forceps. Resected and retrieved. - The examination was otherwise normal. -polyps were tubular adenomas -surveillance colonoscopy in 5 years   Current Outpatient Medications  Medication Sig Dispense Refill   albuterol (VENTOLIN HFA) 108 (90 Base) MCG/ACT inhaler Inhale 2 puffs into the lungs every 6 (six) hours as needed for wheezing or shortness of breath. 18 g 5   hydrOXYzine (ATARAX/VISTARIL) 25 MG tablet 1 qhs prn insomnia 90  tablet 1   ibuprofen (ADVIL,MOTRIN) 200 MG tablet Take 2 tablets (400 mg total) by mouth every 6 (six) hours as needed for headache or mild pain. For headache (Patient taking differently: Take 400 mg by mouth daily as needed for headache or mild pain. For headache) 30 tablet 0   lisinopril-hydrochlorothiazide (ZESTORETIC) 20-25 MG tablet Take 1 tablet by mouth every morning. 90 tablet 1   loratadine (CLARITIN) 10 MG tablet Take 10 mg by mouth daily.     pantoprazole (PROTONIX) 40 MG tablet Take 1 tablet (40 mg total) by mouth daily. 90 tablet 1   potassium chloride (KLOR-CON) 10 MEQ tablet TAKE 1 TABLET BY MOUTH IN THE MORNING, THEN TAKE 2 TABLETS IN THE EVENING 270 tablet 1   pravastatin (PRAVACHOL) 10 MG tablet TAKE 1 TABLET BY MOUTH ON TWICE A WEEK ON MONDAY AND ON FRIDAY 24 tablet 1   No current facility-administered medications for this visit.    Allergies as of 05/30/2021 - Review Complete 05/30/2021  Allergen Reaction Noted   Ceftin [cefuroxime axetil] Shortness Of Breath, Swelling, and Rash 09/09/2012   Codeine Nausea And Vomiting 09/12/2013   Crestor [rosuvastatin calcium]  04/16/2020   Vicodin [hydrocodone-acetaminophen]  12/02/2012   Metformin and related Rash 06/14/2013    ROS:  General: Negative for anorexia, weight loss, fever, chills, fatigue, weakness. ENT: Negative for hoarseness, difficulty swallowing , nasal congestion. CV: Negative for chest pain, angina, palpitations, dyspnea on exertion, peripheral edema.  Respiratory: Negative for dyspnea at rest, dyspnea on exertion, cough, sputum, wheezing.  GI: See history of present illness. GU:  Negative for dysuria, hematuria, urinary incontinence, urinary frequency, nocturnal urination.  Endo: Negative for unusual weight change.    Physical Examination:   BP (!) 167/95   Pulse 81   Temp (!) 96.6 F (35.9 C)   Ht 5\' 2"  (1.575 m)   Wt 181 lb 9.6 oz (82.4 kg)   BMI 33.22 kg/m   General: Well-nourished,  well-developed in no acute distress.  Eyes: No icterus. Mouth:masked Lungs: Clear to auscultation bilaterally.  Heart: Regular rate and rhythm, no murmurs rubs or gallops.  Abdomen: Bowel sounds are normal, nondistended, no hepatosplenomegaly or masses, no abdominal bruits or hernia , no rebound or guarding.  Right upper quadrant tenderness to deep palpation.  No Murphy sign. Extremities: No lower extremity edema. No clubbing or deformities. Neuro: Alert and oriented x 4   Skin: Warm and dry, no jaundice.   Psych: Alert and cooperative, normal mood and affect.  Labs:  Lab Results  Component Value Date   CREATININE 0.93 05/01/2021   BUN 11 05/01/2021   NA 144 05/01/2021   K 4.0 05/01/2021   CL 102 05/01/2021   CO2 25 05/01/2021   Lab Results  Component Value Date   WBC 9.0 03/26/2020   HGB 14.6 03/26/2020   HCT 42.3 03/26/2020   MCV 85 03/26/2020   PLT 287 03/26/2020   Lab Results  Component Value Date   CREATININE 0.93 05/01/2021   BUN 11 05/01/2021   NA 144 05/01/2021   K 4.0 05/01/2021   CL 102 05/01/2021   CO2 25 05/01/2021     Imaging Studies: No results found.   Assessment:  Right upper quadrant pain: Postprandial, progressive over the past few months.  Reminiscent of prior gallbladder issues when pregnant.  CT scan last year with no evidence of stones but 30% could be missed on CT.  Update ultrasound.  If negative would consider HIDA scan to complete gallbladder work-up.  Cannot rule out need for upper endoscopy if work-up is negative although she would be low risk for gastritis or peptic ulcer disease in the setting of daily PPI therapy.   Plan: Right upper quadrant ultrasound. Update LFTs 6-8 hours after next bout of significant abdominal pain.

## 2021-06-02 ENCOUNTER — Ambulatory Visit (HOSPITAL_COMMUNITY)
Admission: RE | Admit: 2021-06-02 | Discharge: 2021-06-02 | Disposition: A | Discharge status: Home / Self Care | Payer: 59 | Source: Ambulatory Visit | Attending: Gastroenterology | Admitting: Gastroenterology

## 2021-06-02 ENCOUNTER — Other Ambulatory Visit: Payer: Self-pay

## 2021-06-02 DIAGNOSIS — R1011 Right upper quadrant pain: Secondary | ICD-10-CM | POA: Insufficient documentation

## 2021-08-06 DIAGNOSIS — R399 Unspecified symptoms and signs involving the genitourinary system: Secondary | ICD-10-CM | POA: Diagnosis not present

## 2021-08-06 DIAGNOSIS — N812 Incomplete uterovaginal prolapse: Secondary | ICD-10-CM | POA: Diagnosis not present

## 2021-08-14 DIAGNOSIS — R102 Pelvic and perineal pain: Secondary | ICD-10-CM | POA: Diagnosis not present

## 2021-08-14 DIAGNOSIS — R399 Unspecified symptoms and signs involving the genitourinary system: Secondary | ICD-10-CM | POA: Diagnosis not present

## 2021-08-27 ENCOUNTER — Telehealth: Payer: Self-pay | Admitting: Family Medicine

## 2021-08-27 NOTE — Telephone Encounter (Signed)
PA for Pantoprazole 40 mg has been denied. Please advise. Thank you.

## 2021-08-27 NOTE — Telephone Encounter (Signed)
PA did not give any other meds that would be approved :(

## 2021-08-27 NOTE — Telephone Encounter (Signed)
Insurance did fax over a paper with alternatives. Fax states that 2 OTC PPI such as omeprazole, lansoprazole have been tried and failed or when the member cannot take all other OTC generic PPI meds. Alternatives include Lansoprazole OTC, Omeprazole OTC, Esomeprazole OTC. Please advise. Thank you.

## 2021-08-27 NOTE — Telephone Encounter (Signed)
Other than saying abracadabra and magically getting her medication approved-did the prior authorization state other medicines that they would cover?  As in what now?

## 2021-08-28 NOTE — Telephone Encounter (Signed)
So once again insurance companies love to throw their weight around Please let the patient know that her medicine is not covered by her insurance They are pretty much insisting/demanding that she try other PPI OTC meds for her's She has to try to separate OTC PPIs for at least 1 month each before they would even consider filling her prescription Protonix All of these medicines including the OTC meds are all PPIs they all work a very similar way.  FYI if the patient does not want to deal with that and would rather just pay for Protonix out of pocket-[facility] states that she can get the medication for approximately $15 a month via [facility] and a printed prescription  In summary-either try the 2 OTC medicines document them and document did not help or did not help and if it did not help what happened both of this would have to be done for 2 separate months and she would have to turn that documentation and before we could retry a prior approval Alternative-do [facility] pay out-of-pocket

## 2021-08-29 NOTE — Telephone Encounter (Signed)
Patient notified and stated she will pay out of pocket for the medication with good rx card because she has been on that medication for years and doesn't want to change.

## 2021-09-22 ENCOUNTER — Other Ambulatory Visit: Payer: Self-pay | Admitting: Family Medicine

## 2021-09-22 ENCOUNTER — Other Ambulatory Visit: Payer: Self-pay

## 2021-09-22 ENCOUNTER — Telehealth: Payer: Self-pay | Admitting: Family Medicine

## 2021-09-22 DIAGNOSIS — E7849 Other hyperlipidemia: Secondary | ICD-10-CM

## 2021-09-22 DIAGNOSIS — E876 Hypokalemia: Secondary | ICD-10-CM

## 2021-09-22 DIAGNOSIS — K760 Fatty (change of) liver, not elsewhere classified: Secondary | ICD-10-CM

## 2021-09-22 DIAGNOSIS — I10 Essential (primary) hypertension: Secondary | ICD-10-CM

## 2021-09-22 MED ORDER — LISINOPRIL-HYDROCHLOROTHIAZIDE 20-25 MG PO TABS: 1.0000 | ORAL_TABLET | Freq: Every morning | ORAL | 0 refills | Status: DC

## 2021-09-22 MED ORDER — POTASSIUM CHLORIDE CRYS ER 10 MEQ PO TBCR: EXTENDED_RELEASE_TABLET | ORAL | 0 refills | Status: DC

## 2021-09-22 NOTE — Telephone Encounter (Signed)
Refill on potassium CL micro sent to [facility]

## 2021-09-22 NOTE — Telephone Encounter (Signed)
Patient is requesting refill on lisinopril HCTZ 20/25 mg called into [facility]

## 2021-09-22 NOTE — Telephone Encounter (Signed)
Left message for pt to return call to inform per drs notes, my chart message sent

## 2021-09-22 NOTE — Telephone Encounter (Signed)
Refills was sent in Recommend lipid liver metabolic 7-do labs before follow-up visit Recommend follow-up visit in April as planned

## 2021-09-23 ENCOUNTER — Telehealth: Payer: Self-pay | Admitting: Family Medicine

## 2021-09-23 MED ORDER — PRAVASTATIN SODIUM 10 MG PO TABS: ORAL_TABLET | ORAL | 3 refills | Status: DC

## 2021-09-23 NOTE — Telephone Encounter (Signed)
Patient is requesting refill on pravastatin  10 mg called into [facility]

## 2021-09-23 NOTE — Telephone Encounter (Signed)
Prescription sent electronically to pharmacy. Patient notified. 

## 2021-09-23 NOTE — Telephone Encounter (Signed)
May have 90-day 

## 2021-09-30 ENCOUNTER — Telehealth: Payer: Self-pay | Admitting: Family Medicine

## 2021-09-30 NOTE — Telephone Encounter (Signed)
Nurses-please see message from 08/27/2021 According to that message she was going to be paying out-of-pocket for the medication If she is paying out-of-pocket I do not believe we have to do any further work? If further input or prescription writing etc. is necessary on my part please let me know otherwise I think this is previously been handled

## 2021-09-30 NOTE — Telephone Encounter (Signed)
PA denied for Pantoprazole 40 mg . Med is covered when two OTC generic PPI such as omeprazole or lansoprazole have been tried and failed, or when the member can not take all other OTC generic PPI meds. Alternative meds include Lansoprazole, Omeprazole, Esomeprazole, etc.. Please advise. Thank you.

## 2021-09-30 NOTE — Telephone Encounter (Addendum)
Patient stated she was paying out of pocket for the medication. Nothing further needed from office at this time.

## 2021-10-27 ENCOUNTER — Other Ambulatory Visit: Payer: Self-pay | Admitting: Obstetrics & Gynecology

## 2021-11-03 ENCOUNTER — Ambulatory Visit: Payer: 59 | Admitting: Family Medicine

## 2021-11-17 ENCOUNTER — Other Ambulatory Visit: Payer: Self-pay

## 2021-11-17 ENCOUNTER — Encounter (HOSPITAL_BASED_OUTPATIENT_CLINIC_OR_DEPARTMENT_OTHER): Payer: Self-pay | Admitting: Obstetrics & Gynecology

## 2021-11-17 DIAGNOSIS — Z01818 Encounter for other preprocedural examination: Secondary | ICD-10-CM | POA: Diagnosis not present

## 2021-11-17 NOTE — Progress Notes (Signed)
? ? Your procedure is scheduled on  Monday, 12/01/21. ? Report to [facility] ? Call this number if you have problems the morning of surgery  :[phone]. ? ? [place].  WE ARE LOCATED IN [facility]. ? ?PLEASE BRING YOUR INSURANCE CARD AND PHOTO ID DAY OF SURGERY. ? ?ONLY 2 PEOPLE ARE ALLOWED IN  WAITING  ROOM.  ?                                   ? REMEMBER: ? DO NOT EAT FOOD, CANDY GUM OR MINTS  AFTER MIDNIGHT THE NIGHT BEFORE YOUR SURGERY . YOU MAY HAVE CLEAR LIQUIDS FROM MIDNIGHT THE NIGHT BEFORE YOUR SURGERY UNTIL  9:00 am. NO CLEAR LIQUIDS AFTER   9:00 am DAY OF SURGERY. ? ?YOU MAY  BRUSH YOUR TEETH MORNING OF SURGERY AND RINSE YOUR MOUTH OUT, NO CHEWING GUM CANDY OR MINTS. ? ? ? ? ?CLEAR LIQUID DIET ? ? ?Foods Allowed                                                                     Foods Excluded ? ?Coffee and tea, regular and decaf                             liquids that you cannot  ?Plain Jell-O                                                                   see through such as: ?Fruit ices (not with fruit pulp)                                     milk, soups, orange juice  ?Plain  Popsicles                                    All solid food ?Carbonated beverages, regular and diet                                    ?Cranberry, grape and apple juices ?Sports drinks like Gatorade ? ?____________________________________________________________________ ?  ? ? TAKE THESE MEDICATIONS MORNING OF SURGERY:  ?Albuterol Inhaler (please bring with you), Claritin or Zyrtec as needed, Protonix and Pravastatin. ? ? ? UP TO 4 VISITORS  MAY VISIT IN THE EXTENDED RECOVERY ROOM UNTIL 800 PM ONLY.  1 VISITOR AGE 69 AND OVER MAY SPEND THE NIGHT AND MUST BE IN EXTENDED RECOVERY ROOM NO LATER THAN 800 PM . YOUR DISCHARGE TIME AFTER YOU SPEND THE NIGHT IS 900 AM THE MORNING AFTER YOUR SURGERY. ? ?  YOU MAY PACK A SMALL OVERNIGHT BAG WITH TOILETRIES  FOR YOUR OVERNIGHT STAY IF YOU WISH. ? ?YOUR PRESCRIPTION MEDICATIONS WILL BE PROVIDED DURING [place]. ? ? ?                                   ?DO NOT WEAR JEWERLY, MAKE UP. ?DO NOT WEAR LOTIONS, POWDERS, PERFUMES OR NAIL POLISH ON YOUR FINGERNAILS. TOENAIL POLISH IS OK TO WEAR. ?DO NOT SHAVE FOR 48 HOURS PRIOR TO DAY OF SURGERY. ?MEN MAY SHAVE FACE AND NECK. ?CONTACTS, GLASSES, OR DENTURES MAY NOT BE WORN TO SURGERY. ? ?REMEMBER: NO SMOKING, DRUGS OR ALCOHOL FOR 24 HOURS BEFORE YOUR SURGERY. ?                                   ?[facility] IS NOT RESPONSIBLE  FOR ANY BELONGINGS.                                  ?                                  . ?          [facility] - Preparing for Surgery ?Before surgery, you can play an important role.  Because skin is not sterile, your skin needs to be as free of germs as possible.  You can reduce the number of germs on your skin by washing with CHG (chlorahexidine gluconate) soap before surgery.  CHG is an antiseptic cleaner which kills germs and bonds with the skin to continue killing germs even after washing. ?Please DO NOT use if you have an allergy to CHG or antibacterial soaps.  If your skin becomes reddened/irritated stop using the CHG and inform your nurse when you arrive at Short Stay. ?Do not shave (including legs and underarms) for at least 48 hours prior to the first CHG shower.  You may shave your face/neck. ?Please follow these instructions carefully: ? 1.  Shower with CHG Soap the night before surgery and the  morning of Surgery. ? 2.  If you choose to wash your hair, wash your hair first as usual with your  normal  shampoo. ? 3.  After you shampoo, rinse your hair and body thoroughly to remove the  shampoo.                            ?4.  Use CHG as you would any other liquid soap.  You can apply chg directly  to the skin and wash  ?                    Gently with a scrungie or clean washcloth. ? 5.  Apply the CHG Soap to your body ONLY FROM THE  NECK DOWN.   Do not use on face/ open      ?                     Wound or open sores. Avoid contact with eyes, ears mouth and genitals (private parts).  ?  Wash face,  Genitals (private parts) with your normal soap. ?            6.  Wash thoroughly, paying special attention to the area where your surgery  will be performed. ? 7.  Thoroughly rinse your body with warm water from the neck down. ? 8.  DO NOT shower/wash with your normal soap after using and rinsing off  the CHG Soap. ?               9.  Pat yourself dry with a clean towel. ?           10.  Wear clean pajamas. ?           11.  Place clean sheets on your bed the night of your first shower and do not  sleep with pets. ?Day of Surgery : ?Do not apply any lotions/deodorants the morning of surgery.  Please wear clean clothes to the hospital/surgery center. ? ?IF YOU HAVE ANY SKIN IRRITATION OR PROBLEMS WITH THE SURGICAL SOAP, PLEASE GET A BAR OF GOLD DIAL SOAP AND SHOWER THE NIGHT BEFORE YOUR SURGERY AND THE MORNING OF YOUR SURGERY. PLEASE LET THE NURSE KNOW MORNING OF YOUR SURGERY IF YOU HAD ANY PROBLEMS WITH THE SURGICAL SOAP. ? ? ?________________________________________________________________________                  ?                                    ?  QUESTIONS CALL Eljay Lave PRE OP NURSE PHONE [phone].                                    ?

## 2021-11-17 NOTE — Progress Notes (Signed)
Spoke w/ via phone for pre-op interview---Julia [name] ?Lab needs dos---- urine pregnancy per surgeon (patient is postmenopausal)              ?Lab results------11/25/21 lab appt for CBC, BMP, type & screen, EKG ?COVID test -----patient states asymptomatic no test needed ?Arrive at -------1000 on Monday, 12/01/2021 ?NPO after MN NO Solid Food.  Clear liquids from MN until---0900 ?Med rec completed ?Medications to take morning of surgery -----Albuterol inhaler prn, Claritin or Zyrtec prn, Protonix, Pravastatin ?Diabetic medication -----n/a ?Patient instructed no nail polish to be worn day of surgery ?Patient instructed to bring photo id and insurance card day of surgery ?Patient aware to have Driver (ride ) / caregiver    for 24 hours after surgery - husband, Julia [name] ?Patient Special Instructions -----Bring Albuterol inhaler on day of surgery. Extended/Overnight stay instructions given. ?Pre-Op special Istructions -----none ?Patient verbalized understanding of instructions that were given at this phone interview. ?Patient denies shortness of breath, chest pain, fever, cough at this phone interview.  ?

## 2021-11-25 ENCOUNTER — Encounter (HOSPITAL_COMMUNITY)
Admission: RE | Admit: 2021-11-25 | Discharge: 2021-11-25 | Disposition: A | Discharge status: Home / Self Care | Payer: BC Managed Care – PPO | Source: Ambulatory Visit | Attending: Obstetrics & Gynecology | Admitting: Obstetrics & Gynecology

## 2021-11-25 DIAGNOSIS — Z01818 Encounter for other preprocedural examination: Secondary | ICD-10-CM | POA: Diagnosis not present

## 2021-11-25 LAB — BASIC METABOLIC PANEL
Anion gap: 8 (ref 5–15)
BUN: 10 mg/dL (ref 8–23)
CO2: 27 mmol/L (ref 22–32)
Calcium: 9.2 mg/dL (ref 8.9–10.3)
Chloride: 101 mmol/L (ref 98–111)
Creatinine, Ser: 0.9 mg/dL (ref 0.44–1.00)
GFR, Estimated: >60 mL/min (ref >60)
Glucose, Bld: 102 mg/dL — ABNORMAL HIGH (ref 70–99)
Potassium: 3.5 mmol/L (ref 3.5–5.1)
Sodium: 136 mmol/L (ref 135–145)

## 2021-11-25 LAB — CBC
HCT: 40.5 % (ref 36.0–46.0)
Hemoglobin: 13.9 g/dL (ref 12.0–15.0)
MCH: 29.6 pg (ref 26.0–34.0)
MCHC: 34.3 g/dL (ref 30.0–36.0)
MCV: 86.4 fL (ref 80.0–100.0)
Platelets: 314 10*3/uL (ref 150–400)
RBC: 4.69 MIL/uL (ref 3.87–5.11)
RDW: 12.4 % (ref 11.5–15.5)
WBC: 9.9 10*3/uL (ref 4.0–10.5)
nRBC: 0 % (ref 0.0–0.2)

## 2021-11-28 ENCOUNTER — Other Ambulatory Visit: Payer: Self-pay | Admitting: Obstetrics & Gynecology

## 2021-12-01 ENCOUNTER — Other Ambulatory Visit: Payer: Self-pay

## 2021-12-01 ENCOUNTER — Encounter (HOSPITAL_BASED_OUTPATIENT_CLINIC_OR_DEPARTMENT_OTHER): Payer: Self-pay | Admitting: Obstetrics & Gynecology

## 2021-12-01 ENCOUNTER — Ambulatory Visit (HOSPITAL_BASED_OUTPATIENT_CLINIC_OR_DEPARTMENT_OTHER): Payer: BC Managed Care – PPO | Admitting: Anesthesiology

## 2021-12-01 ENCOUNTER — Ambulatory Visit (HOSPITAL_BASED_OUTPATIENT_CLINIC_OR_DEPARTMENT_OTHER)
Admission: RE | Admit: 2021-12-01 | Discharge: 2021-12-02 | Disposition: A | Discharge status: Home / Self Care | Payer: BC Managed Care – PPO | Attending: Obstetrics & Gynecology | Admitting: Obstetrics & Gynecology

## 2021-12-01 ENCOUNTER — Encounter (HOSPITAL_BASED_OUTPATIENT_CLINIC_OR_DEPARTMENT_OTHER)
Admission: RE | Disposition: A | Discharge status: Home / Self Care | Payer: Self-pay | Source: Home / Self Care | Attending: Obstetrics & Gynecology

## 2021-12-01 DIAGNOSIS — N393 Stress incontinence (female) (male): Secondary | ICD-10-CM | POA: Diagnosis not present

## 2021-12-01 DIAGNOSIS — I10 Essential (primary) hypertension: Secondary | ICD-10-CM | POA: Insufficient documentation

## 2021-12-01 DIAGNOSIS — N840 Polyp of corpus uteri: Secondary | ICD-10-CM | POA: Insufficient documentation

## 2021-12-01 DIAGNOSIS — N812 Incomplete uterovaginal prolapse: Secondary | ICD-10-CM | POA: Diagnosis not present

## 2021-12-01 DIAGNOSIS — E669 Obesity, unspecified: Secondary | ICD-10-CM | POA: Insufficient documentation

## 2021-12-01 DIAGNOSIS — Z6834 Body mass index (BMI) 34.0-34.9, adult: Secondary | ICD-10-CM | POA: Diagnosis not present

## 2021-12-01 DIAGNOSIS — N888 Other specified noninflammatory disorders of cervix uteri: Secondary | ICD-10-CM | POA: Insufficient documentation

## 2021-12-01 DIAGNOSIS — N8003 Adenomyosis of the uterus: Secondary | ICD-10-CM | POA: Diagnosis not present

## 2021-12-01 DIAGNOSIS — Z01818 Encounter for other preprocedural examination: Secondary | ICD-10-CM

## 2021-12-01 DIAGNOSIS — K219 Gastro-esophageal reflux disease without esophagitis: Secondary | ICD-10-CM | POA: Diagnosis not present

## 2021-12-01 DIAGNOSIS — F418 Other specified anxiety disorders: Secondary | ICD-10-CM | POA: Diagnosis not present

## 2021-12-01 DIAGNOSIS — Z87891 Personal history of nicotine dependence: Secondary | ICD-10-CM | POA: Insufficient documentation

## 2021-12-01 DIAGNOSIS — K660 Peritoneal adhesions (postprocedural) (postinfection): Secondary | ICD-10-CM | POA: Diagnosis not present

## 2021-12-01 DIAGNOSIS — M5430 Sciatica, unspecified side: Secondary | ICD-10-CM | POA: Diagnosis not present

## 2021-12-01 DIAGNOSIS — N8189 Other female genital prolapse: Secondary | ICD-10-CM | POA: Diagnosis not present

## 2021-12-01 HISTORY — DX: Other complications of anesthesia, initial encounter: T88.59XA

## 2021-12-01 HISTORY — DX: Hyperlipidemia, unspecified: E78.5

## 2021-12-01 HISTORY — DX: Family history of other specified conditions: Z84.89

## 2021-12-01 HISTORY — PX: BLADDER SUSPENSION: SHX72

## 2021-12-01 HISTORY — PX: LAPAROSCOPIC VAGINAL HYSTERECTOMY WITH SALPINGO OOPHORECTOMY: SHX6681

## 2021-12-01 HISTORY — PX: ANTERIOR (CYSTOCELE) AND POSTERIOR REPAIR (RECTOCELE) WITH XENFORM GRAFT AND SACROSPINOUS FIXATION: SHX6492

## 2021-12-01 LAB — TYPE AND SCREEN
ABO/RH(D): B POS
Antibody Screen: NEGATIVE

## 2021-12-01 LAB — ABO/RH: ABO/RH(D): B POS

## 2021-12-01 SURGERY — HYSTERECTOMY, VAGINAL, LAPAROSCOPY-ASSISTED, WITH SALPINGO-OOPHORECTOMY
Anesthesia: General

## 2021-12-01 MED ORDER — DIPHENHYDRAMINE HCL 12.5 MG/5ML PO ELIX: 12.5000 mg | ORAL_SOLUTION | Freq: Four times a day (QID) | ORAL | Status: DC | PRN

## 2021-12-01 MED ORDER — ACETAMINOPHEN 500 MG PO TABS
1000.0000 mg | ORAL_TABLET | Freq: Four times a day (QID) | ORAL | Status: DC
  Administered 2021-12-01 – 2021-12-02 (×3): 1000 mg via ORAL

## 2021-12-01 MED ORDER — ONDANSETRON HCL 4 MG/2ML IJ SOLN
INTRAMUSCULAR | Status: AC
  Filled 2021-12-01: qty 4

## 2021-12-01 MED ORDER — DIPHENHYDRAMINE HCL 50 MG/ML IJ SOLN: 12.5000 mg | Freq: Four times a day (QID) | INTRAMUSCULAR | Status: DC | PRN

## 2021-12-01 MED ORDER — LIDOCAINE-EPINEPHRINE 1 %-1:100000 IJ SOLN
INTRAMUSCULAR | Status: DC | PRN
  Administered 2021-12-01: 10 mL
  Administered 2021-12-01: 20 mL

## 2021-12-01 MED ORDER — ACETAMINOPHEN 500 MG PO TABS
ORAL_TABLET | ORAL | Status: AC
  Filled 2021-12-01: qty 2

## 2021-12-01 MED ORDER — OXYBUTYNIN CHLORIDE ER 10 MG PO TB24
10.0000 mg | ORAL_TABLET | Freq: Every day | ORAL | Status: DC
  Administered 2021-12-01: 10 mg via ORAL

## 2021-12-01 MED ORDER — WHITE PETROLATUM EX OINT
TOPICAL_OINTMENT | CUTANEOUS | Status: AC
  Filled 2021-12-01: qty 5

## 2021-12-01 MED ORDER — ROCURONIUM BROMIDE 100 MG/10ML IV SOLN
INTRAVENOUS | Status: DC | PRN
  Administered 2021-12-01: 70 mg via INTRAVENOUS

## 2021-12-01 MED ORDER — OXYCODONE HCL 5 MG PO TABS
10.0000 mg | ORAL_TABLET | ORAL | Status: DC | PRN
  Administered 2021-12-01 – 2021-12-02 (×3): 10 mg via ORAL

## 2021-12-01 MED ORDER — GLYCOPYRROLATE 0.2 MG/ML IJ SOLN
INTRAMUSCULAR | Status: DC | PRN
Start: 2021-12-01 — End: 2021-12-01
  Administered 2021-12-01: .1 mg via INTRAVENOUS

## 2021-12-01 MED ORDER — OXYCODONE HCL 5 MG PO TABS
ORAL_TABLET | ORAL | Status: AC
  Filled 2021-12-01: qty 2

## 2021-12-01 MED ORDER — CLINDAMYCIN PHOSPHATE 900 MG/50ML IV SOLN
900.0000 mg | INTRAVENOUS | Status: AC
  Administered 2021-12-01: 900 mg via INTRAVENOUS

## 2021-12-01 MED ORDER — ZOLPIDEM TARTRATE 5 MG PO TABS: 5.0000 mg | ORAL_TABLET | Freq: Every evening | ORAL | Status: DC | PRN

## 2021-12-01 MED ORDER — OXYBUTYNIN CHLORIDE ER 10 MG PO TB24
ORAL_TABLET | ORAL | Status: AC
  Filled 2021-12-01: qty 1

## 2021-12-01 MED ORDER — FENTANYL CITRATE (PF) 250 MCG/5ML IJ SOLN
INTRAMUSCULAR | Status: AC
  Filled 2021-12-01: qty 5

## 2021-12-01 MED ORDER — SUGAMMADEX SODIUM 200 MG/2ML IV SOLN
INTRAVENOUS | Status: DC | PRN
  Administered 2021-12-01: 200 mg via INTRAVENOUS

## 2021-12-01 MED ORDER — MIDAZOLAM HCL 2 MG/2ML IJ SOLN
INTRAMUSCULAR | Status: AC
  Filled 2021-12-01: qty 2

## 2021-12-01 MED ORDER — FLUORESCEIN SODIUM 10 % IV SOLN
INTRAVENOUS | Status: AC
  Filled 2021-12-01: qty 5

## 2021-12-01 MED ORDER — LIDOCAINE HCL (CARDIAC) PF 100 MG/5ML IV SOSY
PREFILLED_SYRINGE | INTRAVENOUS | Status: DC | PRN
Start: 2021-12-01 — End: 2021-12-01
  Administered 2021-12-01: 80 mg via INTRAVENOUS

## 2021-12-01 MED ORDER — LACTATED RINGERS IV SOLN: INTRAVENOUS | Status: DC

## 2021-12-01 MED ORDER — OXYCODONE HCL 5 MG PO TABS: 5.0000 mg | ORAL_TABLET | Freq: Once | ORAL | Status: DC | PRN

## 2021-12-01 MED ORDER — ONDANSETRON HCL 4 MG/2ML IJ SOLN: 4.0000 mg | Freq: Once | INTRAMUSCULAR | Status: DC | PRN

## 2021-12-01 MED ORDER — LIDOCAINE HCL (PF) 2 % IJ SOLN
INTRAMUSCULAR | Status: AC
  Filled 2021-12-01: qty 15

## 2021-12-01 MED ORDER — SODIUM CHLORIDE (PF) 0.9 % IJ SOLN
INTRAMUSCULAR | Status: DC | PRN
  Administered 2021-12-01: 10 mL

## 2021-12-01 MED ORDER — MIDAZOLAM HCL 2 MG/2ML IJ SOLN
INTRAMUSCULAR | Status: DC | PRN
  Administered 2021-12-01: 2 mg via INTRAVENOUS

## 2021-12-01 MED ORDER — PHENYLEPHRINE HCL (PRESSORS) 10 MG/ML IV SOLN
INTRAVENOUS | Status: AC
  Filled 2021-12-01: qty 1

## 2021-12-01 MED ORDER — ACETAMINOPHEN 500 MG PO TABS
1000.0000 mg | ORAL_TABLET | Freq: Once | ORAL | Status: AC
  Administered 2021-12-01: 1000 mg via ORAL

## 2021-12-01 MED ORDER — OXYCODONE HCL 5 MG/5ML PO SOLN: 5.0000 mg | Freq: Once | ORAL | Status: DC | PRN

## 2021-12-01 MED ORDER — BUPIVACAINE HCL (PF) 0.25 % IJ SOLN
INTRAMUSCULAR | Status: DC | PRN
  Administered 2021-12-01: 30 mL
  Administered 2021-12-01: 10 mL

## 2021-12-01 MED ORDER — CLINDAMYCIN PHOSPHATE 900 MG/50ML IV SOLN
INTRAVENOUS | Status: AC
  Filled 2021-12-01: qty 50

## 2021-12-01 MED ORDER — HYDROMORPHONE HCL 1 MG/ML IJ SOLN: 0.5000 mg | INTRAMUSCULAR | Status: DC | PRN

## 2021-12-01 MED ORDER — ALBUMIN HUMAN 5 % IV SOLN: INTRAVENOUS | Status: DC | PRN

## 2021-12-01 MED ORDER — POVIDONE-IODINE 10 % EX SWAB: 2.0000 "application " | Freq: Once | CUTANEOUS | Status: DC

## 2021-12-01 MED ORDER — PHENYLEPHRINE 80 MCG/ML (10ML) SYRINGE FOR IV PUSH (FOR BLOOD PRESSURE SUPPORT)
PREFILLED_SYRINGE | INTRAVENOUS | Status: AC
  Filled 2021-12-01: qty 40

## 2021-12-01 MED ORDER — ONDANSETRON HCL 4 MG/2ML IJ SOLN
INTRAMUSCULAR | Status: DC | PRN
  Administered 2021-12-01: 4 mg via INTRAVENOUS

## 2021-12-01 MED ORDER — AMISULPRIDE (ANTIEMETIC) 5 MG/2ML IV SOLN: 10.0000 mg | Freq: Once | INTRAVENOUS | Status: DC | PRN

## 2021-12-01 MED ORDER — DEXAMETHASONE SODIUM PHOSPHATE 10 MG/ML IJ SOLN
INTRAMUSCULAR | Status: AC
  Filled 2021-12-01: qty 2

## 2021-12-01 MED ORDER — HEMOSTATIC AGENTS (NO CHARGE) OPTIME
TOPICAL | Status: DC | PRN
Start: 2021-12-01 — End: 2021-12-01
  Administered 2021-12-01: 1 via TOPICAL

## 2021-12-01 MED ORDER — KETOROLAC TROMETHAMINE 30 MG/ML IJ SOLN
INTRAMUSCULAR | Status: DC | PRN
  Administered 2021-12-01: 30 mg via INTRAVENOUS

## 2021-12-01 MED ORDER — PHENYLEPHRINE HCL-NACL 20-0.9 MG/250ML-% IV SOLN
INTRAVENOUS | Status: DC | PRN
  Administered 2021-12-01: 40 ug/min via INTRAVENOUS

## 2021-12-01 MED ORDER — ESTRADIOL 0.1 MG/GM VA CREA
TOPICAL_CREAM | VAGINAL | Status: DC | PRN
Start: 2021-12-01 — End: 2021-12-01
  Administered 2021-12-01: 1 via VAGINAL

## 2021-12-01 MED ORDER — FENTANYL CITRATE (PF) 100 MCG/2ML IJ SOLN: 25.0000 ug | INTRAMUSCULAR | Status: DC | PRN

## 2021-12-01 MED ORDER — DEXAMETHASONE SODIUM PHOSPHATE 4 MG/ML IJ SOLN
INTRAMUSCULAR | Status: DC | PRN
  Administered 2021-12-01: 8 mg via INTRAVENOUS

## 2021-12-01 MED ORDER — FLUORESCEIN SODIUM 10 % IV SOLN
INTRAVENOUS | Status: DC | PRN
  Administered 2021-12-01: 10 mg via INTRAVENOUS

## 2021-12-01 MED ORDER — PHENYLEPHRINE HCL (PRESSORS) 10 MG/ML IV SOLN
INTRAVENOUS | Status: DC | PRN
  Administered 2021-12-01 (×2): 160 ug via INTRAVENOUS
  Administered 2021-12-01 (×3): 80 ug via INTRAVENOUS
  Administered 2021-12-01: 180 ug via INTRAVENOUS
  Administered 2021-12-01: 80 ug via INTRAVENOUS

## 2021-12-01 MED ORDER — EPHEDRINE 5 MG/ML INJ
INTRAVENOUS | Status: AC
  Filled 2021-12-01: qty 10

## 2021-12-01 MED ORDER — KETOROLAC TROMETHAMINE 30 MG/ML IJ SOLN
30.0000 mg | Freq: Four times a day (QID) | INTRAMUSCULAR | Status: DC
  Administered 2021-12-02 (×2): 30 mg via INTRAVENOUS

## 2021-12-01 MED ORDER — GENTAMICIN SULFATE 40 MG/ML IJ SOLN
5.0000 mg/kg | INTRAVENOUS | Status: AC
  Administered 2021-12-01: 320 mg via INTRAVENOUS
  Filled 2021-12-01: qty 8

## 2021-12-01 MED ORDER — ONDANSETRON HCL 4 MG/2ML IJ SOLN: 4.0000 mg | INTRAMUSCULAR | Status: DC | PRN

## 2021-12-01 MED ORDER — EPHEDRINE SULFATE (PRESSORS) 50 MG/ML IJ SOLN
INTRAMUSCULAR | Status: DC | PRN
  Administered 2021-12-01: 10 mg via INTRAVENOUS

## 2021-12-01 MED ORDER — PROPOFOL 10 MG/ML IV BOLUS
INTRAVENOUS | Status: DC | PRN
  Administered 2021-12-01: 140 mg via INTRAVENOUS

## 2021-12-01 MED ORDER — FENTANYL CITRATE (PF) 100 MCG/2ML IJ SOLN
INTRAMUSCULAR | Status: DC | PRN
  Administered 2021-12-01: 50 ug via INTRAVENOUS

## 2021-12-01 SURGICAL SUPPLY — 68 items
ADH SKN CLS APL DERMABOND .7 (GAUZE/BANDAGES/DRESSINGS) ×2
AGENT HMST KT MTR STRL THRMB (HEMOSTASIS) ×2
APL SRG 38 LTWT LNG FL B (MISCELLANEOUS)
APPLICATOR ARISTA FLEXITIP XL (MISCELLANEOUS) IMPLANT
CABLE HIGH FREQUENCY MONO STRZ (ELECTRODE) ×1 IMPLANT
CATH FOLEY 2WAY SLVR  5CC 18FR (CATHETERS) ×3
CATH FOLEY 2WAY SLVR 5CC 18FR (CATHETERS) IMPLANT
COVER BACK TABLE 60X90IN (DRAPES) ×3 IMPLANT
COVER MAYO STAND STRL (DRAPES) ×3 IMPLANT
COVER SURGICAL LIGHT HANDLE (MISCELLANEOUS) ×3 IMPLANT
DEFOGGER SCOPE WARMER CLEARIFY (MISCELLANEOUS) ×3 IMPLANT
DERMABOND ADVANCED (GAUZE/BANDAGES/DRESSINGS) ×1
DERMABOND ADVANCED .7 DNX12 (GAUZE/BANDAGES/DRESSINGS) IMPLANT
DILATOR CANAL MILEX (MISCELLANEOUS) ×1 IMPLANT
DISSECTOR BLUNT TIP ENDO 5MM (MISCELLANEOUS) ×1 IMPLANT
DRSG OPSITE POSTOP 3X4 (GAUZE/BANDAGES/DRESSINGS) ×2 IMPLANT
DURAPREP 26ML APPLICATOR (WOUND CARE) ×3 IMPLANT
ELECT REM PT RETURN 9FT ADLT (ELECTROSURGICAL) ×3
ELECTRODE REM PT RTRN 9FT ADLT (ELECTROSURGICAL) ×2 IMPLANT
GAUZE 4X4 16PLY ~~LOC~~+RFID DBL (SPONGE) ×7 IMPLANT
GAUZE PACKING 1 X5 YD ST (GAUZE/BANDAGES/DRESSINGS) ×1 IMPLANT
GAUZE PACKING 2X5 YD STRL (GAUZE/BANDAGES/DRESSINGS) IMPLANT
GLOVE BIO SURGEON STRL SZ 6.5 (GLOVE) ×6 IMPLANT
GLOVE BIO SURGEON STRL SZ7 (GLOVE) ×6 IMPLANT
GLOVE BIOGEL PI IND STRL 7.0 (GLOVE) ×2 IMPLANT
GLOVE BIOGEL PI INDICATOR 7.0 (GLOVE) ×1
HEMOSTAT ARISTA ABSORB 3G PWDR (HEMOSTASIS) IMPLANT
IV NS 1000ML (IV SOLUTION) ×9
IV NS 1000ML BAXH (IV SOLUTION) IMPLANT
KIT TURNOVER CYSTO (KITS) ×3 IMPLANT
LIGASURE IMPACT 36 18CM CVD LR (INSTRUMENTS) IMPLANT
LIGASURE VESSEL 5MM BLUNT TIP (ELECTROSURGICAL) ×1 IMPLANT
MANIPULATOR ADVINCU DEL 2.5 PL (MISCELLANEOUS) IMPLANT
MANIPULATOR ADVINCU DEL 3.0 PL (MISCELLANEOUS) ×1 IMPLANT
MANIPULATOR ADVINCU DEL 3.5 PL (MISCELLANEOUS) IMPLANT
MANIPULATOR ADVINCU DEL 4.0 PL (MISCELLANEOUS) IMPLANT
NEEDLE HYPO 22GX1.5 SAFETY (NEEDLE) ×1 IMPLANT
NS IRRIG 1000ML POUR BTL (IV SOLUTION) ×3 IMPLANT
PACK LAVH (CUSTOM PROCEDURE TRAY) ×3 IMPLANT
PACK ROBOTIC GOWN (GOWN DISPOSABLE) ×3 IMPLANT
PACK TRENDGUARD 450 HYBRID PRO (MISCELLANEOUS) IMPLANT
POUCH LAPAROSCOPIC INSTRUMENT (MISCELLANEOUS) ×3 IMPLANT
PROTECTOR NERVE ULNAR (MISCELLANEOUS) ×4 IMPLANT
SCISSORS LAP 5X35 DISP (ENDOMECHANICALS) IMPLANT
SET IRRIG Y TYPE TUR BLADDER L (SET/KITS/TRAYS/PACK) ×3 IMPLANT
SET SUCTION IRRIG HYDROSURG (IRRIGATION / IRRIGATOR) ×1 IMPLANT
SET TRI-LUMEN FLTR TB AIRSEAL (TUBING) ×3 IMPLANT
SLING TVT EXACT (Sling) ×1 IMPLANT
SPONGE T-LAP 18X18 ~~LOC~~+RFID (SPONGE) ×3 IMPLANT
SPONGE T-LAP 4X18 ~~LOC~~+RFID (SPONGE) ×3 IMPLANT
SURGIFLO W/THROMBIN 8M KIT (HEMOSTASIS) ×1 IMPLANT
SUT MON AB 2-0 SH 27 (SUTURE) ×2 IMPLANT
SUT MON AB 4-0 PS1 27 (SUTURE) ×3 IMPLANT
SUT PDS AB 2-0 CT2 27 (SUTURE) ×1 IMPLANT
SUT VIC AB 0 CT1 27 (SUTURE) ×15
SUT VIC AB 0 CT1 27XBRD ANBCTR (SUTURE) ×4 IMPLANT
SUT VIC AB 0 CT1 27XCR 8 STRN (SUTURE) ×4 IMPLANT
SUT VIC AB 2-0 CT1 (SUTURE) ×6 IMPLANT
SUT VIC AB 2-0 UR6 27 (SUTURE) ×2 IMPLANT
SUT VICRYL 0 TIES 12 18 (SUTURE) ×3 IMPLANT
SUT VICRYL 0 UR6 27IN ABS (SUTURE) ×5 IMPLANT
SYR BULB IRRIG 60ML STRL (SYRINGE) ×3 IMPLANT
TOWEL OR 17X26 10 PK STRL BLUE (TOWEL DISPOSABLE) ×3 IMPLANT
TRAY FOLEY W/BAG SLVR 14FR LF (SET/KITS/TRAYS/PACK) ×3 IMPLANT
TRENDGUARD 450 HYBRID PRO PACK (MISCELLANEOUS) ×3
TROCAR BLADELESS OPT 5 100 (ENDOMECHANICALS) ×4 IMPLANT
TROCAR PORT AIRSEAL 5X120 (TROCAR) ×3 IMPLANT
TROCAR XCEL NON-BLD 11X100MML (ENDOMECHANICALS) ×3 IMPLANT

## 2021-12-01 NOTE — Anesthesia Preprocedure Evaluation (Addendum)
Anesthesia Evaluation  ?Patient identified by MRN, date of birth, ID band ?Patient awake ? ? ? ?Reviewed: ?Allergy & Precautions, NPO status , Patient's Chart, lab work & pertinent test results ? ?Airway ?Mallampati: II ? ?TM Distance: <3 FB ?Neck ROM: Full ? ? ? Dental ?no notable dental hx. ? ?  ?Pulmonary ?neg pulmonary ROS, former smoker,  ?  ?Pulmonary exam normal ?breath sounds clear to auscultation ? ? ? ? ? ? Cardiovascular ?Exercise Tolerance: Good ?hypertension, Pt. on medications ?Normal cardiovascular exam ?Rhythm:Regular Rate:Normal ? ?Normal sinus rhythm ?Low voltage QRS ?Nonspecific T wave abnormality ?Abnormal ECG ?When compared with ECG of 07-Mar-2011 05:41, ?PREVIOUS ECG IS PRESENT ?No significant change since last tracing ?Confirmed by Glori Bickers [phone]) on 11/25/2021 11:32:02 PM ?  ?Neuro/Psych ?PSYCHIATRIC DISORDERS Anxiety Depression  Neuromuscular disease (sciatica)   ? GI/Hepatic ?Neg liver ROS, GERD  ,  ?Endo/Other  ?obesity ? Renal/GU ?negative Renal ROS  ?negative genitourinary ?  ?Musculoskeletal ?Chronic back pain  ? Abdominal ?  ?Peds ?negative pediatric ROS ?(+)  Hematology ?negative hematology ROS ?(+)   ?Anesthesia Other Findings ? ? Reproductive/Obstetrics ?negative OB ROS ? ?  ? ? ? ? ? ? ? ? ? ? ? ? ? ?  ?  ? ? ? ? ? ? ? ?Anesthesia Physical ?Anesthesia Plan ? ?ASA: 3 ? ?Anesthesia Plan: General  ? ?Post-op Pain Management:   ? ?Induction: Intravenous ? ?PONV Risk Score and Plan: 3 and Treatment may vary due to age or medical condition, Ondansetron, Dexamethasone and Midazolam ? ?Airway Management Planned: Oral ETT ? ?Additional Equipment: None ? ?Intra-op Plan:  ? ?Post-operative Plan: Extubation in OR ? ?Informed Consent: I have reviewed the patients History and Physical, chart, labs and discussed the procedure including the risks, benefits and alternatives for the proposed anesthesia with the patient or authorized representative who has  indicated his/her understanding and acceptance.  ? ? ? ?Dental advisory given ? ?Plan Discussed with: CRNA, Anesthesiologist and Surgeon ? ?Anesthesia Plan Comments:   ? ? ? ? ? ? ?Anesthesia Quick Evaluation ? ?

## 2021-12-01 NOTE — Anesthesia Postprocedure Evaluation (Signed)
Anesthesia Post Note ? ?Patient: RENU ASBY ? ?Procedure(s) Performed: LAPAROSCOPIC ASSISTED VAGINAL HYSTERECTOMY WITH SALPINGO OOPHORECTOMY (Bilateral) ?TRANSVAGINAL TAPE (TVT) PROCEDURE ?ANTERIOR (CYSTOCELE) UTERSACRAL LIGAMENT SUSPENSION ? ?  ? ?Patient location during evaluation: PACU ?Anesthesia Type: General ?Level of consciousness: sedated ?Pain management: pain level controlled ?Vital Signs Assessment: post-procedure vital signs reviewed and stable ?Respiratory status: spontaneous breathing and respiratory function stable ?Cardiovascular status: stable ?Postop Assessment: no apparent nausea or vomiting ?Anesthetic complications: no ? ? ?No notable events documented. ? ?Last Vitals:  ?Vitals:  ? 12/01/21 1915 12/01/21 2030  ?BP: (!) 107/59 112/65  ?Pulse: 76 81  ?Resp: 20 18  ?Temp: 36.6 ?C 36.6 ?C  ?SpO2: 99% 100%  ?  ?Last Pain:  ?Vitals:  ? 12/01/21 2030  ?TempSrc:   ?PainSc: 0-No pain  ? ? ?  ?  ?  ?  ?  ?  ? ?Tareek Sabo DANIEL ? ? ? ? ?

## 2021-12-01 NOTE — H&P (Signed)
MD GYN HISTORY AND PHYSICAL ? ?Admission Date: 12/01/2021  9:44 AM  ?Admit Diagnosis: Symptomatic pelvic relaxation - cystocele and uterine prolapse, urinary stress incontinence ?Patient Name: Julia [name]        ?MRN#: [id number] ? ?Subjective:  ? ? Patient is a 66 y.o. female No obstetric history on file. presents for scheduled LAVH/ BSO, A/P repair, TVT, cystoscopy.   ?Patient with bulge symptoms including feeling like something is "falling out" of her vagina.  ?Patient also reports worsening leaking of urine with increased abdominal pressure ie cough, walking, lifting, laughing.  ?She wears pantiliners daily to protect her clothes.  ? ?Pertinent Gynecological History: ?Menses:post-menopausal  ?Bleeding: none ?Contraception: none ?DES exposure: denies ?Blood transfusions: none ?Sexually transmitted diseases: no past history ?Previous GYN Procedures: DNC  ?Last mammogram: normal Date: 08/30/21 ?Last pap: normal Date: 08/29/21 - normal negative for HPV ?OB History: G0 ? ?Menstrual History: ?Menarche age: 34 ?LMP@  ? ?Medical / Surgical History: ?Past medical history:  ?Past Medical History:  ?Diagnosis Date  ? Anxiety disorder   ? Aortic atherosclerosis ([place]) 04/07/2020  ? Patient on statin we will follow closely  ? Chronic low back pain 11/2018  ? chronic bilateral low back pain  ? Complication of anesthesia   ? Pt states that she is difficult to wake up from surgery. - pt denies this 12/01/21  ? Depression, major   ? Suicide attempt w/ injection of KCL, around 20 years ago per pt on 11/17/2021  ? ETOH abuse   ? remission since 2008  ? Family history of adverse reaction to anesthesia   ? Pt states that her mother and daughter are difficult to wake up from surgery.  ? GERD (gastroesophageal reflux disease)   ? HLD (hyperlipidemia)   ? Hypertension   ? Meningitis   ? Obesity   ? Personal history of suicidal behavior 03/07/2011  ? Better since stopped drinking  ? Sciatica of right side 2022  ?  ?Past surgical history:  ?Past  Surgical History:  ?Procedure Laterality Date  ? APPENDECTOMY  1978  ? BREAST SURGERY  1996  ? biopsy  ? COLONOSCOPY WITH PROPOFOL N/A 04/22/2020  ? Procedure: COLONOSCOPY WITH PROPOFOL;  Surgeon: Eloise Harman, DO;  [facility]: [facility];  Service: Endoscopy;  Laterality: N/A;  12:45pm  ? NASAL POLYP SURGERY Left 1994  ? POLYPECTOMY  04/22/2020  ? Procedure: POLYPECTOMY;  Surgeon: Eloise Harman, DO;  Location: [facility];  Service: Endoscopy;;  ? ?Family History:  ?Family History  ?Problem Relation Age of Onset  ? Cancer Mother   ?     Breast  ? Hypertension Father   ? Colon cancer Maternal Aunt   ?     87s  ?  ?Social History:  reports that she quit smoking about 38 years ago. Her smoking use included cigarettes. She has a 3.00 pack-year smoking history. She has never used smokeless tobacco. She reports that she does not currently use alcohol. She reports that she does not use drugs. ? ?Allergies: ?Allergies  ?Allergen Reactions  ? Ceftin [Cefuroxime Axetil] Shortness Of Breath, Swelling and Rash  ?  Swelling of the hands  ? Codeine Nausea And Vomiting  ? Crestor [Rosuvastatin Calcium]   ?  Agitation, headaches, muscle pain, insomnia, nightmares  ? Vicodin [Hydrocodone-Acetaminophen]   ?  Hyperactivity, dizziness  ?Pt states can take acetaminophen  ? Metformin And Related Rash  ? ?  ?Current Medications at time of admission:  ?Prior  to Admission medications   ?Medication Sig Start Date End Date Taking? Authorizing Provider  ?ibuprofen (ADVIL,MOTRIN) 200 MG tablet Take 2 tablets (400 mg total) by mouth every 6 (six) hours as needed for headache or mild pain. For headache ?Patient taking differently: Take 400 mg by mouth daily as needed for headache or mild pain. For headache 09/22/13  Yes Encarnacion Slates, NP  ?lisinopril-hydrochlorothiazide (ZESTORETIC) 20-25 MG tablet Take 1 tablet by mouth every morning. 09/22/21  Yes Luking, Elayne Snare, MD  ?loratadine (CLARITIN) 10 MG tablet Take 10 mg by mouth daily as  needed.   Yes [provider]  ?pantoprazole (PROTONIX) 40 MG tablet Take 1 tablet (40 mg total) by mouth daily. 05/05/21  Yes Kathyrn Drown, MD  ?potassium chloride (KLOR-CON M) 10 MEQ tablet TAKE 1 TABLET BY MOUTH IN THE MORNING, THEN TAKE 2 TABLETS IN THE EVENING ?Patient taking differently: TAKE 2 TABLETS BY MOUTH IN THE MORNING, THEN TAKE 1 TABLET IN THE EVENING 09/22/21  Yes Luking, Scott A, MD  ?pravastatin (PRAVACHOL) 10 MG tablet TAKE 1 TABLET BY MOUTH ON TWICE A WEEK ON MONDAY AND ON FRIDAY 09/23/21  Yes Kathyrn Drown, MD  ?acetaminophen (TYLENOL) 325 MG tablet Take 650 mg by mouth every 6 (six) hours as needed.    [provider]  ?albuterol (VENTOLIN HFA) 108 (90 Base) MCG/ACT inhaler Inhale 2 puffs into the lungs every 6 (six) hours as needed for wheezing or shortness of breath. 01/31/21   Kathyrn Drown, MD  ?cetirizine (ZYRTEC) 10 MG chewable tablet Chew 10 mg by mouth daily.    [provider]  ?hydrOXYzine (ATARAX/VISTARIL) 25 MG tablet 1 qhs prn insomnia 01/31/21   Kathyrn Drown, MD  ? ? ?Review of Systems: ?Constitutional: Negative   ?HENT: Negative   ?Eyes: Negative   ?Respiratory: Negative   ?Cardiovascular: Negative   ?Gastrointestinal: Negative  ?Genitourinary: neg for vaginal bleeding   ?Musculoskeletal: Negative   ?Skin: Negative   ?Neurological: Negative   ?Endo/Heme/Allergies: Negative   ?Psychiatric/Behavioral: Negative  ?  ?  ?Objective:  ? ?  ?Physical Exam: ?VS: Blood pressure 134/83, pulse 80, temperature 98.1 ?F (36.7 ?C), temperature source Oral, resp. rate 16, height '5\' 2"'$  (1.575 m), weight 81.4 kg, SpO2 99 %. ?Physical Exam ?General:   alert, cooperative, and no distress  ?Skin:   normal  ?Lungs:   clear to auscultation bilaterally  ?Heart:   regular rate and rhythm, S1, S2 normal, no murmur, click, rub or gallop  ?Abdomen:  soft, non-tender; bowel sounds normal; no masses,  no organomegaly  ?Pelvis:  Vulva: no masses and vulvar atrophy; mons lesion.   ?Mons: no erythema, excoriation, atrophy, lesions, masses, swelling, tenderness, or vesicles/ ulcers and normal.  ?Labia Majora: no erythema, excoriation, atrophy, discoloration, lesions, masses, swelling, tenderness, or vesicles/ ulcers and normal.  ?Labia Minora: no erythema, excoriation, atrophy, discoloration, lesions, vesicles, masses, swelling, or tenderness and normal. Introitus: normal. Bartholin's Gland: normal.  ?Vagina: no discharge, erythema, atrophy, lesions, ulcers, swelling, tenderness, prolapse, or blood present and cystocele stage 2.  ?Cervix: no lesions, discharge, bleeding, or cervical motion tenderness and grossly normal  ?Uterus: normal size and contour and midline, mobile, non-tender, 2+ uterine prolapse.  ?Urethral Meatus/ Urethra: no discharge, masses, or tenderness and normal meatus; hypermobile. Adnexa/Parametria: no tenderness or mass palpable.  ? ?Labs / Imaging: ?Recent Results (from the past 2160 hour(s))  ?Type and screen     Status: None  ? Collection Time: 11/25/21  1:09 PM  ?Result Value Ref Range  ? ABO/RH(D) B POS   ? Antibody Screen NEG   ? Sample Expiration 12/04/2021,2359   ? Extend sample reason    ?  NO TRANSFUSIONS OR PREGNANCY IN THE PAST 3 MONTHS ?Performed at [facility], [place] [address], [place], [place] [address] ?  ?CBC     Status: None  ? Collection Time: 11/25/21  1:10 PM  ?Result Value Ref Range  ? WBC 9.9 4.0 - 10.5 K/uL  ? RBC 4.69 3.87 - 5.11 MIL/uL  ? Hemoglobin 13.9 12.0 - 15.0 g/dL  ? HCT 40.5 36.0 - 46.0 %  ? MCV 86.4 80.0 - 100.0 fL  ? MCH 29.6 26.0 - 34.0 pg  ? MCHC 34.3 30.0 - 36.0 g/dL  ? RDW 12.4 11.5 - 15.5 %  ? Platelets 314 150 - 400 K/uL  ? nRBC 0.0 0.0 - 0.2 %  ?  Comment: Performed at [facility], [place] [address], [place],  [address]  ?Basic metabolic panel per protocol     Status: Abnormal  ? Collection Time: 11/25/21  1:10 PM  ?Result Value Ref Range  ? Sodium 136 135 - 145 mmol/L  ? Potassium 3.5 3.5  - 5.1 mmol/L  ? Chloride 101 98 - 111 mmol/L  ? CO2 27 22 - 32 mmol/L  ? Glucose, Bld 102 (H) 70 - 99 mg/dL  ?  Comment: Glucose reference range applies only to samples taken after fasting for at least

## 2021-12-01 NOTE — Anesthesia Procedure Notes (Signed)
Procedure Name: Intubation ?Date/Time: 12/01/2021 12:23 PM ?Performed by: Georgeanne Nim, CRNA ?Pre-anesthesia Checklist: Patient identified, Emergency Drugs available, Suction available, Patient being monitored and Timeout performed ?Patient Re-evaluated:Patient Re-evaluated prior to induction ?Oxygen Delivery Method: Circle system utilized ?Preoxygenation: Pre-oxygenation with 100% oxygen ?Induction Type: IV induction ?Ventilation: Mask ventilation without difficulty and Oral airway inserted - appropriate to patient size ?Grade View: Grade I ?Tube type: Oral ?Tube size: 7.0 mm ?Number of attempts: 1 ?Airway Equipment and Method: Stylet ?Placement Confirmation: ETT inserted through vocal cords under direct vision, positive ETCO2, CO2 detector and breath sounds checked- equal and bilateral ?Secured at: 21 cm ?Tube secured with: Tape ?Dental Injury: Teeth and Oropharynx as per pre-operative assessment  ? ? ? ? ?

## 2021-12-01 NOTE — Transfer of Care (Signed)
Immediate Anesthesia Transfer of Care Note ? ?Patient: CORINNA BURKMAN ? ?Procedure(s) Performed: Procedure(s) (LRB): ?LAPAROSCOPIC ASSISTED VAGINAL HYSTERECTOMY WITH SALPINGO OOPHORECTOMY (Bilateral) ?TRANSVAGINAL TAPE (TVT) PROCEDURE (N/A) ?ANTERIOR (CYSTOCELE) UTERSACRAL LIGAMENT SUSPENSION (N/A) ? ?Patient Location: PACU ? ?Anesthesia Type: General ? ?Level of Consciousness: awake, oriented, sedated and patient cooperative ? ?Airway & Oxygen Therapy: Patient Spontanous Breathing and Patient connected to face mask oxygen ? ?Post-op Assessment: Report given to PACU RN and Post -op Vital signs reviewed and stable ? ?Post vital signs: Reviewed and stable ? ?Complications: No apparent anesthesia complications ?Last Vitals:  ?Vitals Value Taken Time  ?BP 121/70 12/01/21 1745  ?Temp 36.3 ?C 12/01/21 1744  ?Pulse 83 12/01/21 1745  ?Resp 21 12/01/21 1745  ?SpO2 96 % 12/01/21 1745  ?Vitals shown include unvalidated device data. ? ?Last Pain:  ?Vitals:  ? 12/01/21 1030  ?TempSrc: Oral  ?PainSc: 0-No pain  ?   ? ?Patients Stated Pain Goal: 5 (12/01/21 1030) ? ?Complications: No notable events documented. ?

## 2021-12-01 NOTE — Progress Notes (Signed)
Tegaderm dressing noted on lower right side of abdomen in "fold" area and is dry and intact. C/O rt index finger hurting , ice pack applied after informing Dr. Alwyn Pea. ?

## 2021-12-02 DIAGNOSIS — I10 Essential (primary) hypertension: Secondary | ICD-10-CM | POA: Diagnosis not present

## 2021-12-02 DIAGNOSIS — N8003 Adenomyosis of the uterus: Secondary | ICD-10-CM | POA: Diagnosis not present

## 2021-12-02 DIAGNOSIS — N812 Incomplete uterovaginal prolapse: Secondary | ICD-10-CM | POA: Diagnosis not present

## 2021-12-02 DIAGNOSIS — N393 Stress incontinence (female) (male): Secondary | ICD-10-CM | POA: Diagnosis not present

## 2021-12-02 DIAGNOSIS — N888 Other specified noninflammatory disorders of cervix uteri: Secondary | ICD-10-CM | POA: Diagnosis not present

## 2021-12-02 DIAGNOSIS — Z87891 Personal history of nicotine dependence: Secondary | ICD-10-CM | POA: Diagnosis not present

## 2021-12-02 DIAGNOSIS — E669 Obesity, unspecified: Secondary | ICD-10-CM | POA: Diagnosis not present

## 2021-12-02 DIAGNOSIS — Z6834 Body mass index (BMI) 34.0-34.9, adult: Secondary | ICD-10-CM | POA: Diagnosis not present

## 2021-12-02 DIAGNOSIS — K219 Gastro-esophageal reflux disease without esophagitis: Secondary | ICD-10-CM | POA: Diagnosis not present

## 2021-12-02 DIAGNOSIS — K660 Peritoneal adhesions (postprocedural) (postinfection): Secondary | ICD-10-CM | POA: Diagnosis not present

## 2021-12-02 DIAGNOSIS — N840 Polyp of corpus uteri: Secondary | ICD-10-CM | POA: Diagnosis not present

## 2021-12-02 HISTORY — PX: LAPAROSCOPIC VAGINAL HYSTERECTOMY WITH SALPINGO OOPHORECTOMY: SHX6681

## 2021-12-02 LAB — BASIC METABOLIC PANEL
Anion gap: 10 (ref 5–15)
BUN: 13 mg/dL (ref 8–23)
CO2: 22 mmol/L (ref 22–32)
Calcium: 8.2 mg/dL — ABNORMAL LOW (ref 8.9–10.3)
Chloride: 102 mmol/L (ref 98–111)
Creatinine, Ser: 1.37 mg/dL — ABNORMAL HIGH (ref 0.44–1.00)
GFR, Estimated: 43 mL/min — ABNORMAL LOW (ref >60)
Glucose, Bld: 170 mg/dL — ABNORMAL HIGH (ref 70–99)
Potassium: 3.5 mmol/L (ref 3.5–5.1)
Sodium: 134 mmol/L — ABNORMAL LOW (ref 135–145)

## 2021-12-02 LAB — CBC
HCT: 35 % — ABNORMAL LOW (ref 36.0–46.0)
Hemoglobin: 11.5 g/dL — ABNORMAL LOW (ref 12.0–15.0)
MCH: 29 pg (ref 26.0–34.0)
MCHC: 32.9 g/dL (ref 30.0–36.0)
MCV: 88.4 fL (ref 80.0–100.0)
Platelets: 239 10*3/uL (ref 150–400)
RBC: 3.96 MIL/uL (ref 3.87–5.11)
RDW: 12.2 % (ref 11.5–15.5)
WBC: 11.1 10*3/uL — ABNORMAL HIGH (ref 4.0–10.5)
nRBC: 0 % (ref 0.0–0.2)

## 2021-12-02 LAB — SURGICAL PATHOLOGY

## 2021-12-02 MED ORDER — ACETAMINOPHEN 500 MG PO TABS
ORAL_TABLET | ORAL | Status: AC
  Filled 2021-12-02: qty 2

## 2021-12-02 MED ORDER — KETOROLAC TROMETHAMINE 30 MG/ML IJ SOLN
INTRAMUSCULAR | Status: AC
  Filled 2021-12-02: qty 1

## 2021-12-02 MED ORDER — IBUPROFEN 800 MG PO TABS: 800.0000 mg | ORAL_TABLET | Freq: Three times a day (TID) | ORAL | 5 refills | Status: DC | PRN

## 2021-12-02 MED ORDER — CIPROFLOXACIN HCL 250 MG PO TABS: 250.0000 mg | ORAL_TABLET | Freq: Two times a day (BID) | ORAL | 0 refills | Status: AC

## 2021-12-02 MED ORDER — OXYCODONE-ACETAMINOPHEN 5-325 MG PO TABS: 1.0000 | ORAL_TABLET | ORAL | 0 refills | Status: DC | PRN

## 2021-12-02 MED ORDER — OXYCODONE-ACETAMINOPHEN 5-325 MG PO TABS
ORAL_TABLET | ORAL | Status: AC
  Filled 2021-12-02: qty 2

## 2021-12-02 MED ORDER — OXYCODONE HCL 5 MG PO TABS
ORAL_TABLET | ORAL | Status: AC
  Filled 2021-12-02: qty 2

## 2021-12-02 MED ORDER — LACTATED RINGERS IV BOLUS
1000.0000 mL | Freq: Once | INTRAVENOUS | Status: AC
  Administered 2021-12-02: 1000 mL via INTRAVENOUS

## 2021-12-02 MED ORDER — BETHANECHOL CHLORIDE 10 MG PO TABS: 10.0000 mg | ORAL_TABLET | Freq: Three times a day (TID) | ORAL | 1 refills | Status: DC | PRN

## 2021-12-02 NOTE — Progress Notes (Addendum)
Dr Alwyn Pea called and updated on urine output 500 cc in 14 hrs orders received for IV bolus. Dr. Alwyn Pea aware that foley is still in b/c of decrease output. ?

## 2021-12-02 NOTE — Discharge Summary (Signed)
Physician Discharge Summary  ?Patient ID: ?Julia [name] ?MRN: [id number] ?DOB/AGE: 09/17/1955 66 y.o. ? ?Admit date: 12/01/2021 ?Discharge date: 12/02/2021 ? ?Admission Diagnoses: Symptomatic pelvic relaxation, cystocele, urinary stress incontinence ?Discharge Diagnoses:  ?Principal Problem: ?  Incomplete uterovaginal prolapse ?Same as above ? ?Operation: Laparoscopic vaginal hysterectomy bilateral salpingoopherectomy, uterosacral ligament  ?Colposuspension, anterior repair, TVT, urethrocystoscopy ? ? ?Discharged Condition: good ? ?Hospital Course: Patient taken to operating room where below procedures were performed.  There were no intraoperative or post operative complications.  Patient progressed well in the hospital and on POD#1 was meeting all discharge criteria and was discharged home.   ? ?Disposition: Discharge disposition: 01-Home or Self Care ? ? ? ? ? ? ?Discharge Medications:  ?Allergies as of 12/02/2021   ? ?   Reactions  ? Ceftin [cefuroxime Axetil] Shortness Of Breath, Swelling, Rash  ? Swelling of the hands  ? Codeine Nausea And Vomiting  ? Crestor [rosuvastatin Calcium]   ? Agitation, headaches, muscle pain, insomnia, nightmares  ? Vicodin [hydrocodone-acetaminophen]   ? Hyperactivity, dizziness  ?Pt states can take acetaminophen  ? Metformin And Related Rash  ? ?  ? ?  ?Medication List  ?  ? ?TAKE these medications   ? ?acetaminophen 325 MG tablet ?Commonly known as: TYLENOL ?Take 650 mg by mouth every 6 (six) hours as needed. ?  ?albuterol 108 (90 Base) MCG/ACT inhaler ?Commonly known as: VENTOLIN HFA ?Inhale 2 puffs into the lungs every 6 (six) hours as needed for wheezing or shortness of breath. ?  ?bethanechol 10 MG tablet ?Commonly known as: URECHOLINE ?Take 1 tablet (10 mg total) by mouth 3 (three) times daily as needed (bladder spasm). ?  ?cetirizine 10 MG chewable tablet ?Commonly known as: ZYRTEC ?Chew 10 mg by mouth daily. ?  ?ciprofloxacin 250 MG tablet ?Commonly known as: CIPRO ?Take 1 tablet  (250 mg total) by mouth 2 (two) times daily for 7 days. ?  ?hydrOXYzine 25 MG tablet ?Commonly known as: ATARAX ?1 qhs prn insomnia ?  ?ibuprofen 800 MG tablet ?Commonly known as: ADVIL ?Take 1 tablet (800 mg total) by mouth every 8 (eight) hours as needed for cramping or moderate pain. ?What changed:  ?medication strength ?how much to take ?when to take this ?reasons to take this ?additional instructions ?  ?lisinopril-hydrochlorothiazide 20-25 MG tablet ?Commonly known as: ZESTORETIC ?Take 1 tablet by mouth every morning. ?  ?loratadine 10 MG tablet ?Commonly known as: CLARITIN ?Take 10 mg by mouth daily as needed. ?  ?oxyCODONE-acetaminophen 5-325 MG tablet ?Commonly known as: PERCOCET/ROXICET ?Take 1-2 tablets by mouth every 4 (four) hours as needed for severe pain. ?  ?pantoprazole 40 MG tablet ?Commonly known as: PROTONIX ?Take 1 tablet (40 mg total) by mouth daily. ?  ?potassium chloride 10 MEQ tablet ?Commonly known as: KLOR-CON M ?TAKE 1 TABLET BY MOUTH IN THE MORNING, THEN TAKE 2 TABLETS IN THE EVENING ?What changed: additional instructions ?  ?pravastatin 10 MG tablet ?Commonly known as: PRAVACHOL ?TAKE 1 TABLET BY MOUTH ON TWICE A WEEK ON MONDAY AND ON FRIDAY ?  ? ?  ? ?  ?  ? ? ?  ?Discharge Care Instructions  ?(From admission, onward)  ?  ? ? ?  ? ?  Start     Ordered  ? 12/02/21 0000  If the dressing is still on your incision site when you go home, remove it on the third day after your surgery date. Remove dressing if it begins to fall off, or  if it is dirty or damaged before the third day.       ? 12/02/21 0932  ? 12/02/21 0000  Discharge wound care:       ?Comments: Your incisions have skin glue which will dissolve over time.  You may shower, no baths. Don't use lotions or savs on incision sites.  ? 12/02/21 0932  ? ?  ?  ? ?  ?  ? ?Discharge Instructions: Ambulate, watching urinary output.  ? ?Follow-up: one to two weeks for incision check ? ?Final Pathology: pending ? ? ?Signed: Rogelio Seen  PinnMD ?12/02/2021, 9:33 AM ?  ?

## 2021-12-02 NOTE — Progress Notes (Signed)
TC to Dr. Alwyn Pea, reported night shift stated foley removed at 0600, reported pt has since voided 25 ml with PVR of 0 ml. Pt denies abdominal pressure/bladder distension. MD en route.  ?

## 2021-12-02 NOTE — Progress Notes (Signed)
MD POST OP PROGRESS NOTE ? ?Julia [name] is a16 y.o.  ?[id number] ? ?Post Op Date # 1 ? ?Subjective: ?Patient is Doing well postoperatively. ?Patient has The patient is not having any pain.,  ?Diet:regular diet ?Patient has voided without difficulty and passed flatus Yes.   ?Activity: walking ?Patient passed her voiding trial this morning ? ?Objective: ?Vital signs in last 24 hours: ?Temp:  [97.1 ?F (36.2 ?C)-98.4 ?F (36.9 ?C)] 98 ?F (36.7 ?C) (05/02 0850) ?Pulse Rate:  [74-86] 76 (05/02 0850) ?Resp:  [14-23] 18 (05/02 0850) ?BP: (96-134)/(56-83) 111/64 (05/02 0850) ?SpO2:  [95 %-100 %] 100 % (05/02 0850) ?Weight:  [81.4 kg] 81.4 kg (05/01 1000) ? ?Intake/Output from previous day: ?05/01 0701 - 05/02 0700 ?In: 3308 [I.V.:2900] ?Out: 775 [Urine:575] ?Intake/Output this shift: ?Total I/O ?In: -  ?Out: 25 [Urine:25] ?Recent Labs  ?Lab 11/25/21 ?1310 12/02/21 ?0011  ?WBC 9.9 11.1*  ?HGB 13.9 11.5*  ?HCT 40.5 35.0*  ?PLT 314 239  ? ?  ?Recent Labs  ?Lab 11/25/21 ?1310 12/02/21 ?0011  ?NA 136 134*  ?K 3.5 3.5  ?CL 101 102  ?CO2 27 22  ?BUN 10 13  ?CREATININE 0.90 1.37*  ?CALCIUM 9.2 8.2*  ?GLUCOSE 102* 170*  ?  ?EXAM: ?General: cooperative and no distress ?GI: soft, non-tender; bowel sounds normal; no masses,  no organomegaly and incision: clean, dry, and intact ?Vaginal Bleeding: none ? ? ?Assessment: ?s/p Procedure(s): ?LAPAROSCOPIC ASSISTED VAGINAL HYSTERECTOMY WITH SALPINGO OOPHORECTOMY ?TRANSVAGINAL TAPE (TVT) PROCEDURE ?ANTERIOR (CYSTOCELE) UTERSACRAL LIGAMENT SUSPENSION: progressing well ? ?Plan: ?Discontinue IV fluids ?Discharge home ? LOS: 0 days  ? ? ?Bayley Hurn,MD 12/02/2021 9:24 AM  ?

## 2021-12-02 NOTE — Progress Notes (Signed)
Vaginal packing and catheter was removed by RN at [place] ?

## 2021-12-03 ENCOUNTER — Encounter (HOSPITAL_BASED_OUTPATIENT_CLINIC_OR_DEPARTMENT_OTHER): Payer: Self-pay | Admitting: Obstetrics & Gynecology

## 2021-12-12 DIAGNOSIS — Z09 Encounter for follow-up examination after completed treatment for conditions other than malignant neoplasm: Secondary | ICD-10-CM | POA: Diagnosis not present

## 2021-12-12 NOTE — Op Note (Signed)
OPERATIVE NOTE ? ?Julia [name] ? ?DOB:    1956-05-03 ? ?MRN:    [id number] ? ?CSN:    [id number] ? ?Date of Surgery:  12/01/2021 ? ?Preoperative Diagnosis: ?1. Pelvic Floor relaxation ?2. Uterine prolapse ?3. Urinary Stress Incontinence ?  ?Postoperative Diagnosis: ?Same as above plus abdominal adhesions ?  ?Procedure: ?Procedure: ?LAPAROSCOPIC ASSISTED VAGINAL HYSTERECTOMY WITH SALPINGOOPHORECTOMY ?TRANSVAGINAL TAPE (TVT) PROCEDURE ? ANTERIOR (CYSTOCELE) UTERSACRAL LIGAMENT SUSPENSION ?CYSTOSCOPY ?  ?Surgeon: ?Mady Haagensen. Enaya Howze MD ?  ?Assistant: ?Waymon Amato MD ?  ?Anesthetic: ?General ETA ?  ?Disposition: ?The patient presents with the above-mentioned diagnosis. She understands the indications for surgical procedure.  She also understands the alternative treatment options. She accepts the risk of, but not limited to, anesthetic complications, bleeding, infections, and possible damage to the surrounding organs. ? ?Estimated Blood Loss:  200 mL ?        ?Drains: Foley catheter ? ?UOP:  350 mL Clear urine ( ?        ?Total IV Fluids: 2600 ml ? ?Blood Given: none  ?        ?Specimens: Uterus, cervix, bilateral fallopian tubes and ovaries ?        ?Implants: none ?       ?Complications:  * No complications entered in OR log * ?        ?Disposition: PACU - hemodynamically stable. ?        ?Condition: stable ?   ?Operative Findings: ?Normal appearing uterus, cervix, bilateral fallopian tubes and ovaries, normal liver edge. Normal bladder urothelium and brisk jets seen emitting from bilateral ureteral orifices ?  ?Procedure: ? The patient was taken to the operating room where a general anesthetic was given. The patient's  abdomen was prepped with ChloraPrep. The perineum and vagina were prepped with multiple layers of Betadine. A Foley catheter was placed in the bladder. An examination under anesthesia was performed. ?  ?A Time out was performed confirming the patient name and procedure. An operative Grave's speculum was placed in  the vagina and the anterior lip of the cervix was held with a single tooth tenaculum. The uterus was sounded to 9cm  and dilated with [facility] dilators. The tenaculum was removed and the anterior lip of the cervix held with a stitch of 0-vicryl. 3.5 cm Advincula uterine manipulator was placed and the intrauterine balloon inflated. The  Speculum was removed. Surgeons gloves and gown were changed and attention turned to the abdomen.  ?  ?The patient was sterilely draped. The subumbilical area was injected with half percent Marcaine.  An incision was made in the umbilicus with 11 blade scalpel after infiltration with 0.5% Marcaine. A 17m Excel Visiport trocar was used with l048m0 degree laparoscope attached to perform direct entry into the patient's abdomen. Direct video visualization confirmed entry into the abdomen and pneumoperitoneum was obtained using approximately 3L CO2 gas. The patient was placed in steep Trendelenburg position. After infiltration with Marcaine, a small incision was made and a 5 mm trocar was inserted into the abdominal cavity along the left pelvis under direct visualization.  Another 31m731mtrocar was placed along the mid upper left abdomen. A third 31mm231mocar was placed along the  right pelvis. There was no injury noted with placement of trocars. The pelvic contents were visualized and findings noted above, both ureters were identified crossing the pelvic brim and pelvic sidewall coursing well below operative field.  Pictures were taken of the patient's pelvic structures.  Dense adhesions  from the anterior abdominal wall to the pelvis were taken down with the Ligasure.  ?  ?The left fallopian tube was identified and followed to its fimbriated end. The proximal portion of the left fallopian tube was cauterized and cut. The left round ligament was cauterized and transected. The left utero-ovarian ligament was cauterized and cut. The bladder flap was developed by entering the anterior broad  ligament.  The right fallopian tube was identified and followed to its fimbriated end. The proximal portion of the right fallopian tube was cauterized and cut. The right upper pedicle was then isolated. The right round ligament was then cauterized and transected via the Ligasure. The anterior broad ligament was then isolated and the the mesovarium was skeletonized. A bladder flap was mobilized by the endoshears attached to monopolar cautery and the peritoneum on the vesicouterine fold was incised to mobilize the bladder.The right utero-ovarian ligament was cauterized and cut. The bladder flap was developed further.  ?  ?At this point we felt we were ready to proceed with the vaginal portion of the procedure. The patient was placed in a more lithotomy position. A weighted speculum was placed in the posterior vagina. The cervix was injected with half percent Marcaine with epinephrine. A circumferential incision was made around the cervix. The vaginal mucosa was advanced anteriorly and posteriorly. The anterior cul-de-sac and in the posterior cul-de-sac were sharply entered. Alternating from right to left the uterosacral ligaments, paracervical tissues, parametrial tissues, and uterine arteries were clamped, cut, sutured, and tied securely with 0- vicryl.  The uterus was removed from the operative field. Hemostasis was confirmed. The sutures attached to the uterosacral ligaments were brought out through the vaginal angles and then tied securely.The midline posterior vaginal cuff was then marked with an 0-V suture in the 6 o?clock position.  0-PDS suture was then used and entered at the 5 o?clock position through the external vaginal cuff and taken through the USL on the ipsilateral side.  This suture was then carried through the USL on the contralateral side and then returned through the 7 o?clock position in the posterior vaginal cuff.  The stitch was repeated in a similar fashion using the 4 and 8 o?clock positions.   The PDS suture was then tied down to suspend the cuff to the USL.  A final check was made for hemostasis and again hemostasis was confirmed. The vaginal cuff was closed using figure-of-eight sutures incorporating the anterior vaginal mucosa, the anterior peritoneum, posterior peritoneum, and the posterior vaginal mucosa. The apex of the vagina was noted to elevate into the midpelvis. At this time, the Foley catheter was removed and the cystoscope was carefully advanced into the bladder.  Cystoscopy revealed normal urothelium throughout. Bilateral ureteral jets were visualized and there was no evidence of injury or trauma to either the bladder or the urethra.  ?  ?An37 French Foley catheter was inserted into the bladder. The exit points were marked 2 cm on each side of the midline immediately above the pubic symphysis. 30 cc of 0.25 % Marcaine  local anesthesia was injected using a spinal needle into the exit sites and passing the needle along the back of the pubic symphysis until the needle touched the endopelvic fascia.  ?  ?1% lidocaine with epinephrine was injected submucosally at the level of the mid urethra, creating a space between the vaginal wall and the periurethral fascia. A sagittal incision no more than 1.5 cm long starting at 1.0 cm  ?cephalad from the urethral  meatus was performed using a 15 blade scalpel. Two 0.5 cm paraurethral lateral dissections were performed using Metzenbaum scissors to allow the subsequent passage of the implant. dThe bladder was confirmed to be empty and the GYNECARE TVT Reusable Rigid Catheter Guide was inserted into  the channel of the 18 French Foley catheter. The tip of Foley catheter was pushed gently toward the posterior lateral wall of the bladder opposite to the intended Trocar Sheath passage in order to displace the bladder to the contralateral side and prevent injury.  The Trocar Sheath was secured to the Trocar Handle by hooking the Trocar Sheath Cut-out onto the  Trocar Sheath Lock on the Trocar Handle.  The Trocar Shaft was placed inside one of the two white Trocar Sheaths, using the dominant hand to hold the Trocar Handle and the  non-dominant hand to control initial

## 2021-12-18 ENCOUNTER — Other Ambulatory Visit: Payer: Self-pay | Admitting: Family Medicine

## 2021-12-31 ENCOUNTER — Encounter: Payer: Self-pay | Admitting: Family Medicine

## 2021-12-31 ENCOUNTER — Ambulatory Visit (INDEPENDENT_AMBULATORY_CARE_PROVIDER_SITE_OTHER): Payer: BC Managed Care – PPO | Admitting: Family Medicine

## 2021-12-31 VITALS — BP 130/74 | HR 83 | Temp 97.2°F | Ht 62.0 in | Wt 177.8 lb

## 2021-12-31 DIAGNOSIS — E7849 Other hyperlipidemia: Secondary | ICD-10-CM | POA: Diagnosis not present

## 2021-12-31 DIAGNOSIS — I10 Essential (primary) hypertension: Secondary | ICD-10-CM | POA: Diagnosis not present

## 2021-12-31 DIAGNOSIS — D509 Iron deficiency anemia, unspecified: Secondary | ICD-10-CM | POA: Diagnosis not present

## 2021-12-31 DIAGNOSIS — E876 Hypokalemia: Secondary | ICD-10-CM

## 2021-12-31 MED ORDER — LISINOPRIL-HYDROCHLOROTHIAZIDE 20-12.5 MG PO TABS
1.0000 | ORAL_TABLET | Freq: Every day | ORAL | 1 refills | Status: DC
Start: 2021-12-31 — End: 2022-07-02

## 2021-12-31 NOTE — Patient Instructions (Addendum)
Hi Julia [name] that your surgery went well Please monitor your blood pressure and send me an update in several weeks  Please do your lab work in July  Follow-up in November or December TakeCare-Dr. Nicki Reaper

## 2021-12-31 NOTE — Progress Notes (Signed)
   Subjective:    Patient ID: Julia [name], female    DOB: [birth year], 66 y.o.   MRN: [id number]  HPI  Patient here for follow up on medications. Other hyperlipidemia - Plan: CBC with Differential, Lipid Profile, Hepatic function panel, Basic Metabolic Panel (BMET)  Primary hypertension - Plan: CBC with Differential, Lipid Profile, Hepatic function panel, Basic Metabolic Panel (BMET)  Hypokalemia - Plan: CBC with Differential, Lipid Profile, Hepatic function panel, Basic Metabolic Panel (BMET)  Iron deficiency anemia, unspecified iron deficiency anemia type - Plan: CBC with Differential, Lipid Profile, Hepatic function panel, Basic Metabolic Panel (BMET) Patient had recent hysterectomy laparoscopically Benign pathology Ovaries were removed Overall she is doing well Did have some mild renal insufficiency and anemia at the time that she had her labs done in the hospital She is trying to eat healthy trying to fit in a little more walking taking her medicines on a regular basis  Review of Systems     Objective:   Physical Exam General-in no acute distress Eyes-no discharge Lungs-respiratory rate normal, CTA CV-no murmurs,RRR Extremities skin warm dry no edema Neuro grossly normal Behavior normal, alert        Assessment & Plan:   1. Other hyperlipidemia Cholesterol profile continue medication watch diet - CBC with Differential - Lipid Profile - Hepatic function panel - Basic Metabolic Panel (BMET)  2. Primary hypertension Blood pressure doing very well reduce the dose patient to send [place] update in several weeks we may be able to keep reducing the dose depending on her readings - CBC with Differential - Lipid Profile - Hepatic function panel - Basic Metabolic Panel (BMET)  3. Hypokalemia Follow-up on potassium continue current medications - CBC with Differential - Lipid Profile - Hepatic function panel - Basic Metabolic Panel (BMET)  4. Iron deficiency anemia,  unspecified iron deficiency anemia type Follow-up of blood counts patient was anemic after having her hysterectomy - CBC with Differential - Lipid Profile - Hepatic function panel - Basic Metabolic Panel (BMET) Follow-up by fall time

## 2022-01-01 ENCOUNTER — Other Ambulatory Visit: Payer: Self-pay

## 2022-01-01 MED ORDER — POTASSIUM CHLORIDE CRYS ER 10 MEQ PO TBCR: EXTENDED_RELEASE_TABLET | ORAL | 0 refills | Status: DC

## 2022-02-20 DIAGNOSIS — Z1231 Encounter for screening mammogram for malignant neoplasm of breast: Secondary | ICD-10-CM | POA: Diagnosis not present

## 2022-03-31 ENCOUNTER — Telehealth: Payer: Self-pay | Admitting: *Deleted

## 2022-03-31 MED ORDER — PANTOPRAZOLE SODIUM 40 MG PO TBEC: 40.0000 mg | DELAYED_RELEASE_TABLET | Freq: Every day | ORAL | 3 refills | Status: DC

## 2022-03-31 NOTE — Telephone Encounter (Signed)
Patient stated she is going to stay on the pantoprazole because she has a coupon that makes the cost reasonable and would like the script sent to pharmacy.

## 2022-03-31 NOTE — Telephone Encounter (Signed)
Medication sent to pharmacy  

## 2022-03-31 NOTE — Telephone Encounter (Signed)
Nurses It is not unusual for insurance companies to do this Please explained to the patient Patient has options-OTC PPIs work the same way. She can go with OTC Prevacid for 30 days if that does not help well enough that OTC Prilosec each 1 of these OTC measures need to be tried for at least 30 days if she fails both then we can resubmit pantoprazole otherwise her insurance wants her to use OTC meds Certain patient can do a follow-up office visit with [place] to discuss further if she wishes thank you

## 2022-03-31 NOTE — Telephone Encounter (Addendum)
PA for pantoprazole '40mg'$  denied by insurance. This medication is only covered when 2 OTC generic PPI have been tried and failed by patient

## 2022-03-31 NOTE — Telephone Encounter (Signed)
May send 30 dy with 12 rf

## 2022-04-13 ENCOUNTER — Other Ambulatory Visit: Payer: Self-pay | Admitting: Family Medicine

## 2022-04-23 DIAGNOSIS — R87810 Cervical high risk human papillomavirus (HPV) DNA test positive: Secondary | ICD-10-CM | POA: Diagnosis not present

## 2022-04-23 DIAGNOSIS — Z6832 Body mass index (BMI) 32.0-32.9, adult: Secondary | ICD-10-CM | POA: Diagnosis not present

## 2022-04-23 DIAGNOSIS — Z01419 Encounter for gynecological examination (general) (routine) without abnormal findings: Secondary | ICD-10-CM | POA: Diagnosis not present

## 2022-04-23 DIAGNOSIS — Z23 Encounter for immunization: Secondary | ICD-10-CM | POA: Diagnosis not present

## 2022-06-01 ENCOUNTER — Ambulatory Visit: Payer: BC Managed Care – PPO | Admitting: Nurse Practitioner

## 2022-06-14 ENCOUNTER — Telehealth: Payer: BC Managed Care – PPO | Admitting: Emergency Medicine

## 2022-06-14 DIAGNOSIS — U071 COVID-19: Secondary | ICD-10-CM | POA: Diagnosis not present

## 2022-06-14 NOTE — Progress Notes (Signed)
Virtual Visit Consent   Julia [name], you are scheduled for a virtual visit with a [facility] provider today. Just as with appointments in the office, your consent must be obtained to participate. Your consent will be active for this visit and any virtual visit you may have with one of our providers in the next 365 days. If you have a MyChart account, a copy of this consent can be sent to you electronically.  As this is a virtual visit, video technology does not allow for your provider to perform a traditional examination. This may limit your provider's ability to fully assess your condition. If your provider identifies any concerns that need to be evaluated in person or the need to arrange testing (such as labs, EKG, etc.), we will make arrangements to do so. Although advances in technology are sophisticated, we cannot ensure that it will always work on either your end or our end. If the connection with a video visit is poor, the visit may have to be switched to a telephone visit. With either a video or telephone visit, we are not always able to ensure that we have a secure connection.  By engaging in this virtual visit, you consent to the provision of healthcare and authorize for your insurance to be billed (if applicable) for the services provided during this visit. Depending on your insurance coverage, you may receive a charge related to this service.  I need to obtain your verbal consent now. Are you willing to proceed with your visit today? Julia [name] has provided verbal consent on 06/14/2022 for a virtual visit (video or telephone). Carvel Getting, NP  Date: 06/14/2022 5:46 PM  Virtual Visit via Video Note   I, Carvel Getting, connected with  Julia [name]  ([id number], 08-31-1955) on 06/14/22 at  5:45 PM EST by a video-enabled telemedicine application and verified that I am speaking with the correct person using two identifiers.  Location: Patient: Virtual Visit Location Patient:  Home Provider: Virtual Visit Location Provider: Home Office   I discussed the limitations of evaluation and management by telemedicine and the availability of in person appointments. The patient expressed understanding and agreed to proceed.    History of Present Illness: Julia [name] is a 66 y.o. who identifies as a female who was assigned female at birth, and is being seen today for Covid. She began having symptoms 2 days ago, felt worse yesterday, tested positive for Covid today. Whole household is sick with it. Feels like a mild flu. Chills, cough, no appetite, no sense of taste. Denies SOB or wheezing. Husband is taking paxlovid, she would like rx too.   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 09/15/2013   Substance induced mood disorder ([place]) 09/13/2013   Severe recurrent major depression with psychotic features, 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[place]) 09/13/2013   HTN (hypertension) 03/06/2011   GERD (gastroesophageal reflux disease) 03/06/2011   Abnormal LFTs 03/06/2011    Allergies:  Allergies  Allergen Reactions   Ceftin [Cefuroxime Axetil] Shortness Of Breath, Swelling and Rash    Swelling of the  hands   Codeine Nausea And Vomiting   Crestor [Rosuvastatin Calcium]     Agitation, headaches, muscle pain, insomnia, nightmares   Vicodin [Hydrocodone-Acetaminophen]     Hyperactivity, dizziness  Pt states can take acetaminophen   Metformin And Related Rash   Medications:  Current Outpatient Medications:    acetaminophen (TYLENOL) 325 MG tablet, Take 650 mg by mouth  every 6 (six) hours as needed., Disp: , Rfl:    albuterol (VENTOLIN HFA) 108 (90 Base) MCG/ACT inhaler, Inhale 2 puffs into the lungs every 6 (six) hours as needed for wheezing or shortness of breath., Disp: 18 g, Rfl: 5   bethanechol (URECHOLINE) 10 MG tablet, Take 1 tablet (10 mg total) by mouth 3 (three) times daily as needed (bladder spasm)., Disp: 30 tablet, Rfl: 1   cetirizine (ZYRTEC) 10 MG chewable tablet, Chew 10 mg by mouth daily., Disp: , Rfl:    hydrOXYzine (ATARAX/VISTARIL) 25 MG tablet, 1 qhs prn insomnia, Disp: 90 tablet, Rfl: 1   ibuprofen (ADVIL) 800 MG tablet, Take 1 tablet (800 mg total) by mouth every 8 (eight) hours as needed for cramping or moderate pain., Disp: 60 tablet, Rfl: 5   lisinopril-hydrochlorothiazide (ZESTORETIC) 20-12.5 MG tablet, Take 1 tablet by mouth daily., Disp: 90 tablet, Rfl: 1   pantoprazole (PROTONIX) 40 MG tablet, Take 1 tablet (40 mg total) by mouth daily., Disp: 90 tablet, Rfl: 3   potassium chloride (KLOR-CON M) 10 MEQ tablet, TAKE 1 TABLET BY MOUTH IN THE MORNING THEN TAKE 2 TABLETS BY MOUTH IN THE EVENING, Disp: 270 tablet, Rfl: 0   pravastatin (PRAVACHOL) 10 MG tablet, TAKE 1 TABLET BY MOUTH ON TWICE A WEEK ON MONDAY AND ON FRIDAY, Disp: 24 tablet, Rfl: 3  Observations/Objective: Patient is well-developed, well-nourished in no acute distress.  Resting comfortably  at home.  Head is normocephalic, atraumatic.  No labored breathing.  Speech is clear and coherent with logical content.  Patient is alert and oriented at baseline.    Assessment and Plan: 1. COVID-19  Pt does not have recent labs showing renal function. I am not able to rx paxlovid. Pt will contact PCP's office tomorrow for guidance.   Follow Up Instructions: I discussed the assessment and treatment plan with the patient. The patient was provided an opportunity to ask questions and all were answered. The patient agreed with the plan and demonstrated an understanding of the  instructions.  A copy of instructions were sent to the patient via MyChart unless otherwise noted below.   The patient was advised to call back or seek an in-person evaluation if the symptoms worsen or if the condition fails to improve as anticipated.  Time:  I spent 5 minutes with the patient via telehealth technology discussing the above problems/concerns.    Carvel Getting, NP

## 2022-06-14 NOTE — Patient Instructions (Signed)
  Stefanie Libel, thank you for joining Carvel Getting, NP for today's virtual visit.  While this provider is not your primary care provider (PCP), if your PCP is located in our provider database this encounter information will be shared with them immediately following your visit.   [facility] account gives you access to today's visit and all your visits, tests, and labs performed at [facility] " click here if you don't have a [facility] account or go to [email or web address]  Consent: (Patient) Julia [name] provided verbal consent for this virtual visit at the beginning of the encounter.  Current Medications:  Current Outpatient Medications:    acetaminophen (TYLENOL) 325 MG tablet, Take 650 mg by mouth every 6 (six) hours as needed., Disp: , Rfl:    albuterol (VENTOLIN HFA) 108 (90 Base) MCG/ACT inhaler, Inhale 2 puffs into the lungs every 6 (six) hours as needed for wheezing or shortness of breath., Disp: 18 g, Rfl: 5   bethanechol (URECHOLINE) 10 MG tablet, Take 1 tablet (10 mg total) by mouth 3 (three) times daily as needed (bladder spasm)., Disp: 30 tablet, Rfl: 1   cetirizine (ZYRTEC) 10 MG chewable tablet, Chew 10 mg by mouth daily., Disp: , Rfl:    hydrOXYzine (ATARAX/VISTARIL) 25 MG tablet, 1 qhs prn insomnia, Disp: 90 tablet, Rfl: 1   ibuprofen (ADVIL) 800 MG tablet, Take 1 tablet (800 mg total) by mouth every 8 (eight) hours as needed for cramping or moderate pain., Disp: 60 tablet, Rfl: 5   lisinopril-hydrochlorothiazide (ZESTORETIC) 20-12.5 MG tablet, Take 1 tablet by mouth daily., Disp: 90 tablet, Rfl: 1   pantoprazole (PROTONIX) 40 MG tablet, Take 1 tablet (40 mg total) by mouth daily., Disp: 90 tablet, Rfl: 3   potassium chloride (KLOR-CON M) 10 MEQ tablet, TAKE 1 TABLET BY MOUTH IN THE MORNING THEN TAKE 2 TABLETS BY MOUTH IN THE EVENING, Disp: 270 tablet, Rfl: 0   pravastatin (PRAVACHOL) 10 MG tablet, TAKE 1 TABLET BY MOUTH ON TWICE A WEEK ON  MONDAY AND ON FRIDAY, Disp: 24 tablet, Rfl: 3   Medications ordered in this encounter:  No orders of the defined types were placed in this encounter.    *If you need refills on other medications prior to your next appointment, please contact your pharmacy*  Follow-Up: Call back or seek an in-person evaluation if the symptoms worsen or if the condition fails to improve as anticipated.  [facility] [phone]  Other Instructions Please contact Dr. Lance Sell office or an urgent care for further assistance. With updated labwork showing your kidney function, you can get Paxlovid.    If you have been instructed to have an in-person evaluation today at a local Urgent Care facility, please use the link below. It will take you to a list of all of our available [facility], including address, phone number and hours of operation. Please do not delay care.  [facility]  If you or a family member do not have a primary care provider, use the link below to schedule a visit and establish care. When you choose a [facility] primary care physician or advanced practice provider, you gain a long-term partner in health. Find a Primary Care Provider  Learn more about [facility]'s in-office and virtual care options: [facility]

## 2022-06-28 IMAGING — CT CT ABD-PELV W/ CM
1 of 3 series · 13 of 32 positions shown, 18 images · IV contrast (APPLIED)
Comparison: No priors.

CLINICAL DATA: 63-year-old female with history of left lower
quadrant abdominal pain. Rectal bleeding for the past 2 weeks.

EXAM:
CT ABDOMEN AND PELVIS WITH CONTRAST
TECHNIQUE: Multidetector CT imaging of the abdomen and pelvis was performed
using the standard protocol following bolus administration of
intravenous contrast.
CONTRAST:  100mL [id number] IOPAMIDOL ([id number]) INJECTION 61%

[Series 2: abd/pelvis w/cm · axial · 0.73mm/px · z∈[-426,-40]mm · 13 of 89 slices shown, 18 images]
[im 6/89  soft-tissue]
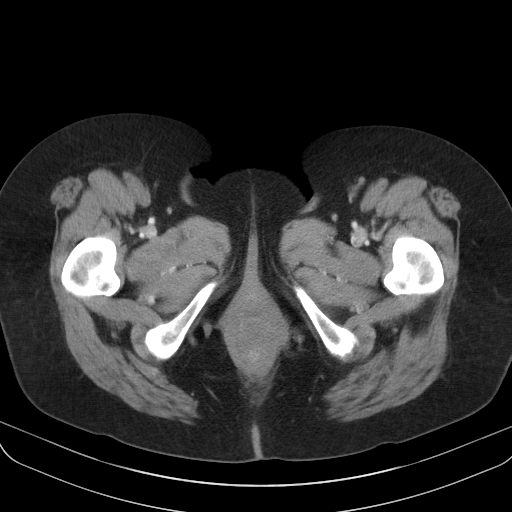
[im 6/89  bone]
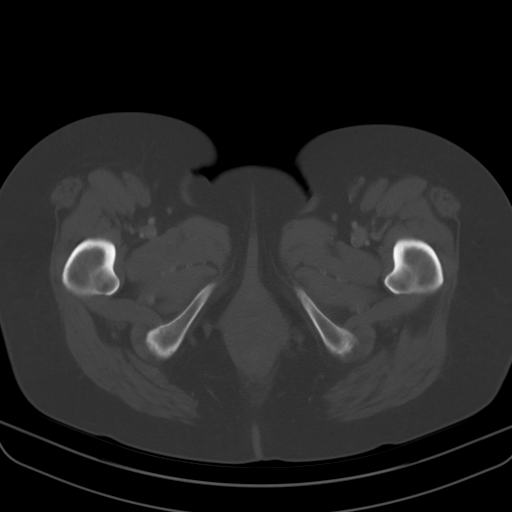
[im 12/89  soft-tissue]
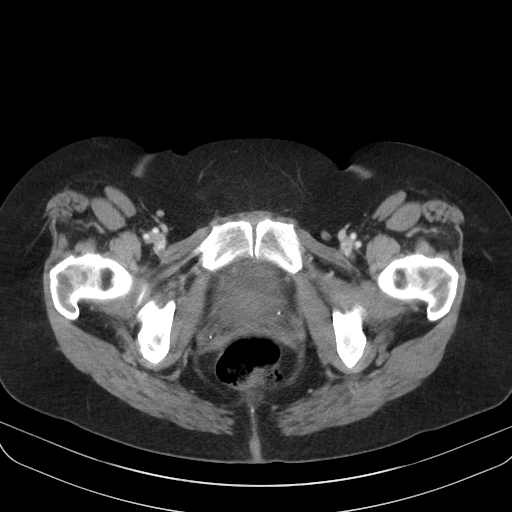
[im 18/89  soft-tissue]
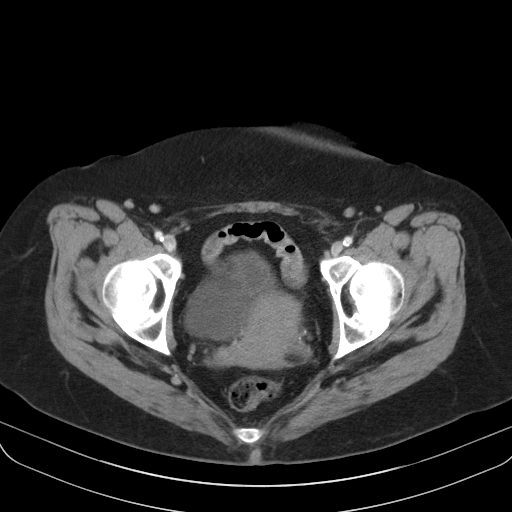
[im 30/89  soft-tissue]
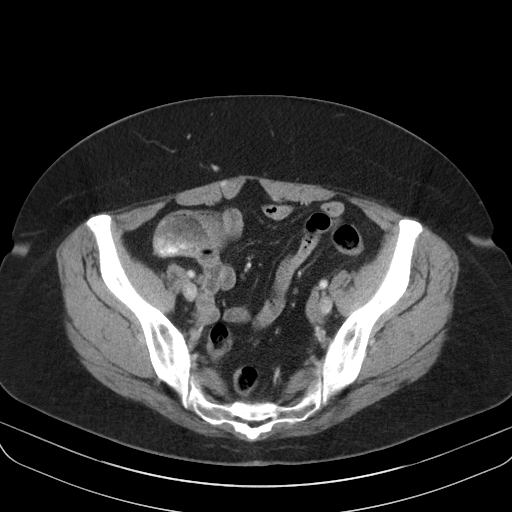
[im 36/89  soft-tissue]
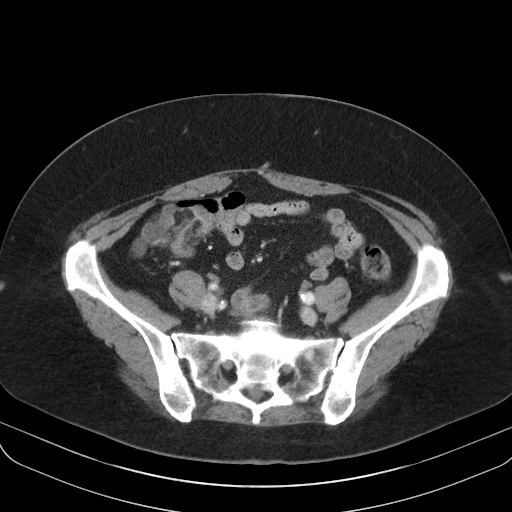
[im 42/89  soft-tissue]
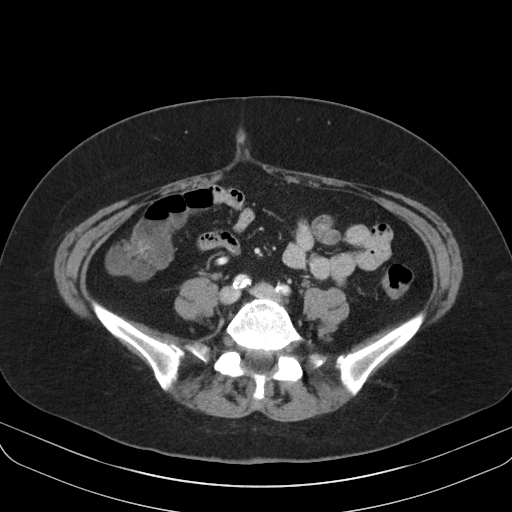
[im 47/89  soft-tissue]
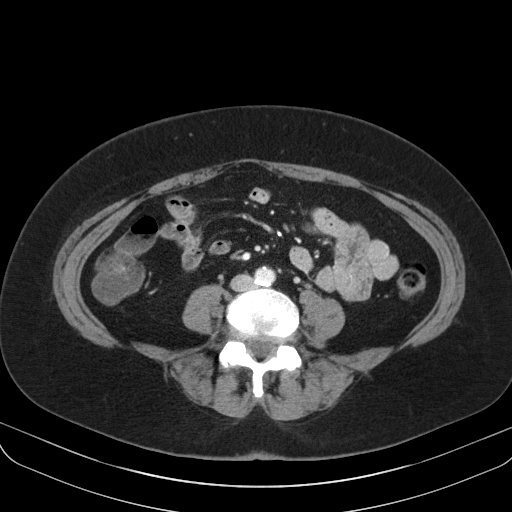
[im 53/89  soft-tissue]
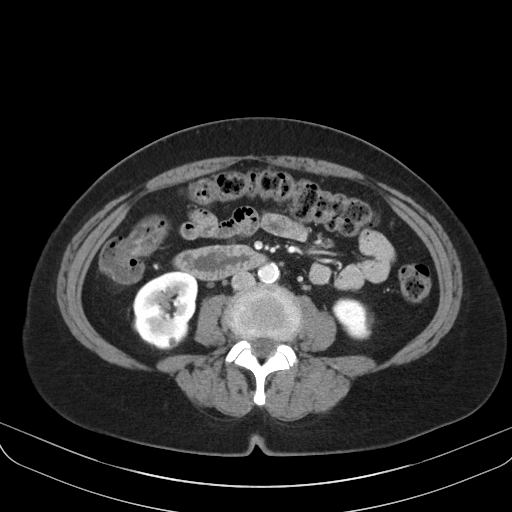
[im 59/89  soft-tissue]
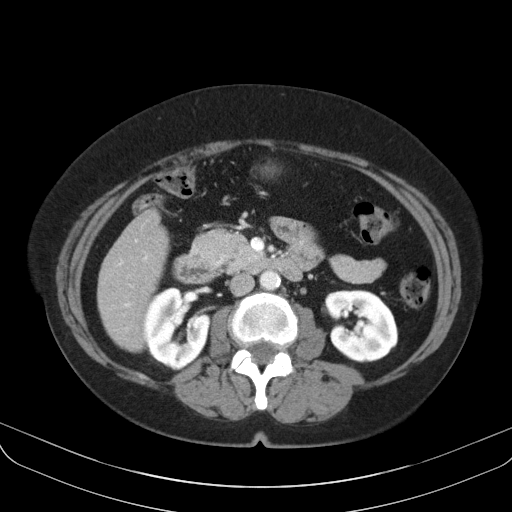
[im 59/89  bone]
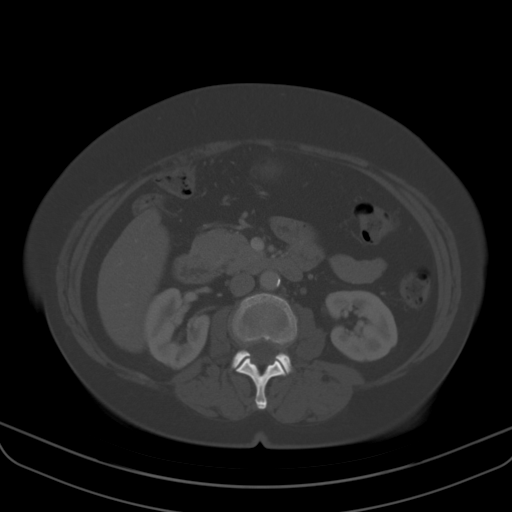
[im 65/89  lung]
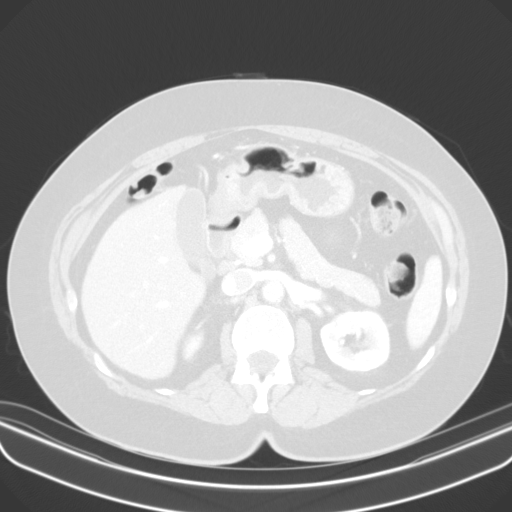
[im 71/89  soft-tissue]
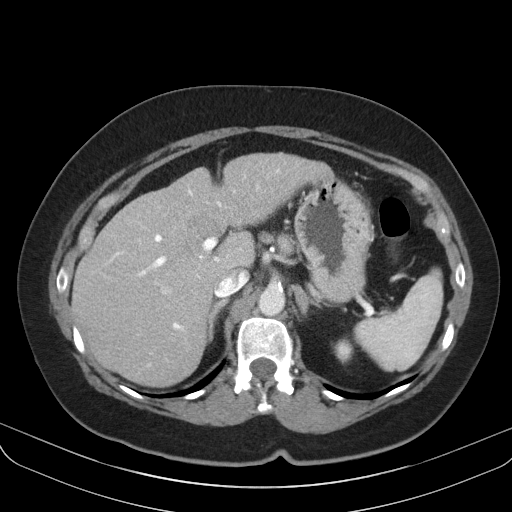
[im 71/89  lung]
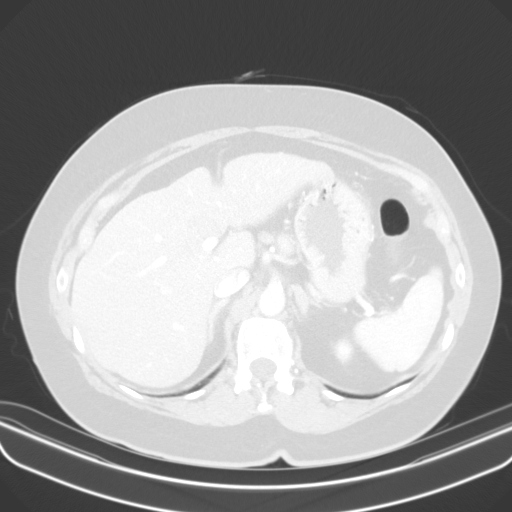
[im 77/89  soft-tissue]
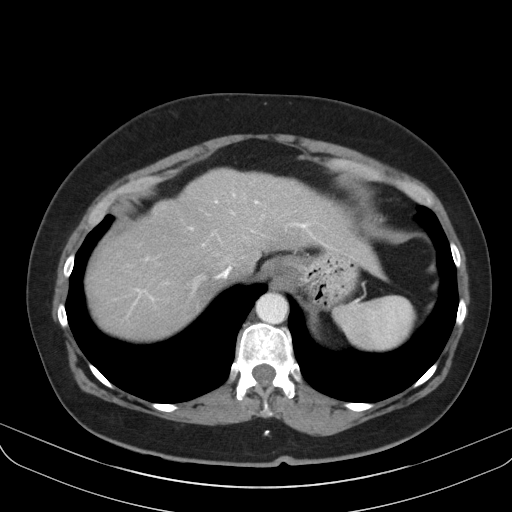
[im 77/89  lung]
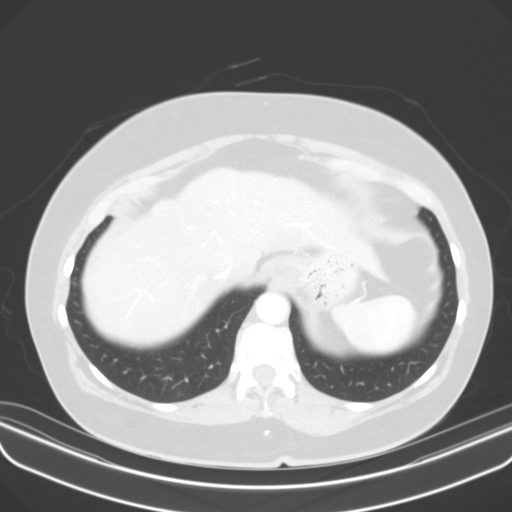
[im 83/89  soft-tissue]
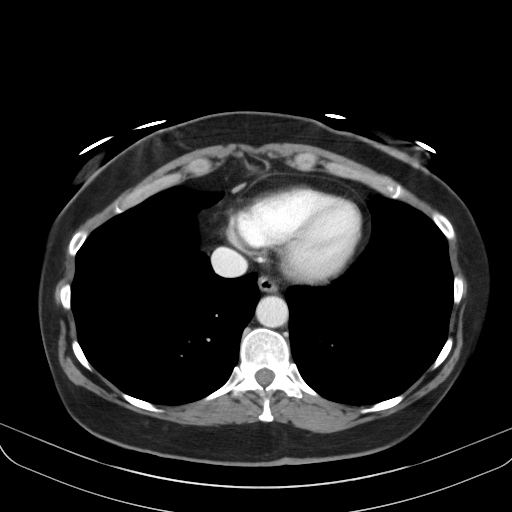
[im 83/89  lung]
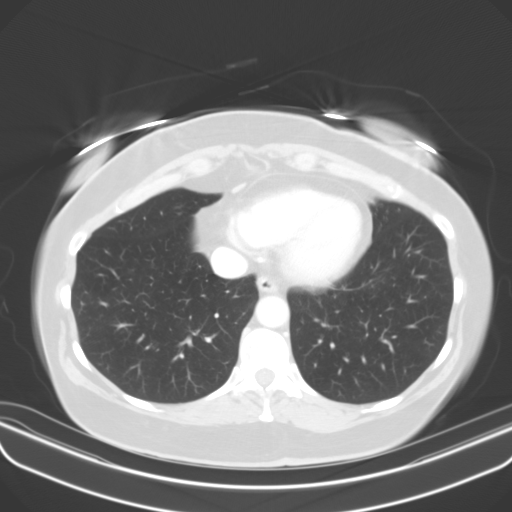

[13 of 32 positions shown; findings below may reference images not displayed]

FINDINGS: Lower chest: Unremarkable.

Hepatobiliary: No suspicious cystic or solid hepatic lesions. No
intra or extrahepatic biliary ductal dilatation. Gallbladder is
normal in appearance.

Pancreas: No pancreatic mass. No pancreatic ductal dilatation. No
pancreatic or peripancreatic fluid collections or inflammatory
changes.

Spleen: Unremarkable.

Adrenals/Urinary Tract: Subcentimeter low-attenuation lesions in
both kidneys, too small to definitively characterize, but
statistically likely to represent tiny cysts. Bilateral adrenal
glands are normal in appearance. No hydroureteronephrosis. Urinary
bladder is normal in appearance.

Stomach/Bowel: Normal appearance of the stomach. No pathologic
dilatation of small bowel or colon. Numerous colonic diverticulae
are noted, without surrounding inflammatory changes to suggest an
acute diverticulitis at this time. The appendix is not confidently
identified and may be surgically absent. Regardless, there are no
inflammatory changes noted adjacent to the cecum to suggest the
presence of an acute appendicitis at this time.

Vascular/Lymphatic: Aortic atherosclerosis, without evidence of
aneurysm or dissection in the abdominal or pelvic vasculature. No
lymphadenopathy noted in the abdomen or pelvis.

Reproductive: Uterus and ovaries are unremarkable in appearance.

Other: No significant volume of ascites.  No pneumoperitoneum.

Musculoskeletal: There are no aggressive appearing lytic or blastic
lesions noted in the visualized portions of the skeleton.
IMPRESSION: 1. No acute findings are noted in the abdomen or pelvis to account
for the patient's symptoms.
2. Colonic diverticulosis without evidence of acute diverticulitis
at this time.
3. Aortic atherosclerosis.

## 2022-07-02 ENCOUNTER — Ambulatory Visit (INDEPENDENT_AMBULATORY_CARE_PROVIDER_SITE_OTHER): Payer: BC Managed Care – PPO | Admitting: Family Medicine

## 2022-07-02 VITALS — BP 110/60 | HR 99 | Temp 97.7°F | Ht 62.0 in | Wt 167.0 lb

## 2022-07-02 DIAGNOSIS — E7849 Other hyperlipidemia: Secondary | ICD-10-CM | POA: Diagnosis not present

## 2022-07-02 DIAGNOSIS — J019 Acute sinusitis, unspecified: Secondary | ICD-10-CM

## 2022-07-02 DIAGNOSIS — I10 Essential (primary) hypertension: Secondary | ICD-10-CM | POA: Diagnosis not present

## 2022-07-02 DIAGNOSIS — R7301 Impaired fasting glucose: Secondary | ICD-10-CM

## 2022-07-02 DIAGNOSIS — R7989 Other specified abnormal findings of blood chemistry: Secondary | ICD-10-CM

## 2022-07-02 DIAGNOSIS — D72829 Elevated white blood cell count, unspecified: Secondary | ICD-10-CM | POA: Diagnosis not present

## 2022-07-02 MED ORDER — LISINOPRIL-HYDROCHLOROTHIAZIDE 10-12.5 MG PO TABS: 1.0000 | ORAL_TABLET | Freq: Every day | ORAL | 3 refills | Status: DC

## 2022-07-02 MED ORDER — DOXYCYCLINE HYCLATE 100 MG PO CAPS: 100.0000 mg | ORAL_CAPSULE | Freq: Two times a day (BID) | ORAL | 0 refills | Status: DC

## 2022-07-02 MED ORDER — PRAVASTATIN SODIUM 10 MG PO TABS
ORAL_TABLET | ORAL | 3 refills | Status: DC
Start: 2022-07-02 — End: 2022-12-31

## 2022-07-02 MED ORDER — PANTOPRAZOLE SODIUM 40 MG PO TBEC: 40.0000 mg | DELAYED_RELEASE_TABLET | Freq: Every day | ORAL | 3 refills | Status: DC

## 2022-07-02 MED ORDER — POTASSIUM CHLORIDE CRYS ER 10 MEQ PO TBCR: EXTENDED_RELEASE_TABLET | ORAL | 0 refills | Status: DC

## 2022-07-02 NOTE — Progress Notes (Signed)
   Subjective:    Patient ID: Julia [name], female    DOB: [birth year], 66 y.o.   MRN: [id number]  Hypertension This is a chronic problem. Treatments tried: lisinopril/hctz.   Patient for blood pressure check up.  The patient does have hypertension.   Patient relates dietary measures try to minimize salt The importance of healthy diet and activity were discussed Patient relates compliance  Patient here for follow-up regarding cholesterol.    Patient relates taking medication on a regular basis Denies problems with medication Importance of dietary measures discussed Regular lab work regarding lipid and liver was checked and if needing additional labs was appropriately ordered  Patient with recent [place] a few weeks ago still dealing with fatigue tiredness to some degree brain fog also relates sinus pressure pain and discomfort over the past week no wheezing or difficulty breathing    Review of Systems     Objective:   Physical Exam General-in no acute distress Eyes-no discharge Lungs-respiratory rate normal, CTA CV-no murmurs,RRR Extremities skin warm dry no edema Neuro grossly normal Behavior normal, alert  Moderate sinus tenderness to palpation/percussion      Assessment & Plan:  1. Primary hypertension HTN- patient seen for follow-up regarding HTN.   Diet, medication compliance, appropriate labs and refills were completed.   Importance of keeping blood pressure under good control to lessen the risk of complications discussed Regular follow-up visits discussed  - Lipid Panel - Hepatic Function Panel - Basic Metabolic Panel - CBC with Differential  2. Other hyperlipidemia Hyperlipidemia-importance of diet, weight control, activity, compliance with medications discussed.   Recent labs reviewed.   Any additional labs or refills ordered.   Importance of keeping under good control discussed. Regular follow-up visits discussed  - Lipid Panel - Hepatic Function  Panel - Basic Metabolic Panel - CBC with Differential  3. Leukocytosis, unspecified type Previous CBC white blood count slightly elevated go ahead with checking lab work. - Lipid Panel - Hepatic Function Panel - Basic Metabolic Panel - CBC with Differential  Acute rhinosinusitis antibiotic prescribed follow-up if progressive troubles

## 2022-07-03 ENCOUNTER — Telehealth: Payer: Self-pay | Admitting: Family Medicine

## 2022-07-03 ENCOUNTER — Other Ambulatory Visit: Payer: Self-pay | Admitting: Family Medicine

## 2022-07-03 LAB — BASIC METABOLIC PANEL
BUN/Creatinine Ratio: 10 — ABNORMAL LOW (ref 12–28)
BUN: 16 mg/dL (ref 8–27)
CO2: 23 mmol/L (ref 20–29)
Calcium: 9.8 mg/dL (ref 8.7–10.3)
Chloride: 96 mmol/L (ref 96–106)
Creatinine, Ser: 1.63 mg/dL — ABNORMAL HIGH (ref 0.57–1.00)
Glucose: 113 mg/dL — ABNORMAL HIGH (ref 70–99)
Potassium: 3.3 mmol/L — ABNORMAL LOW (ref 3.5–5.2)
Sodium: 138 mmol/L (ref 134–144)
eGFR: 35 mL/min/{1.73_m2} — ABNORMAL LOW (ref >59)

## 2022-07-03 LAB — CBC WITH DIFFERENTIAL/PLATELET
Basophils Absolute: 0.2 10*3/uL (ref 0.0–0.2)
Basos: 2 %
EOS (ABSOLUTE): 0.2 10*3/uL (ref 0.0–0.4)
Eos: 2 %
Hematocrit: 44.3 % (ref 34.0–46.6)
Hemoglobin: 14.4 g/dL (ref 11.1–15.9)
Immature Grans (Abs): 0 10*3/uL (ref 0.0–0.1)
Immature Granulocytes: 0 %
Lymphocytes Absolute: 2.7 10*3/uL (ref 0.7–3.1)
Lymphs: 25 %
MCH: 26.9 pg (ref 26.6–33.0)
MCHC: 32.5 g/dL (ref 31.5–35.7)
MCV: 83 fL (ref 79–97)
Monocytes Absolute: 1.1 10*3/uL — ABNORMAL HIGH (ref 0.1–0.9)
Monocytes: 10 %
Neutrophils Absolute: 6.6 10*3/uL (ref 1.4–7.0)
Neutrophils: 61 %
Platelets: 400 10*3/uL (ref 150–450)
RBC: 5.35 x10E6/uL — ABNORMAL HIGH (ref 3.77–5.28)
RDW: 13.2 % (ref 11.7–15.4)
WBC: 10.8 10*3/uL (ref 3.4–10.8)

## 2022-07-03 LAB — LIPID PANEL
Chol/HDL Ratio: 4.1 ratio (ref 0.0–4.4)
Cholesterol, Total: 192 mg/dL (ref 100–199)
HDL: 47 mg/dL (ref >39)
LDL Chol Calc (NIH): 123 mg/dL — ABNORMAL HIGH (ref 0–99)
Triglycerides: 121 mg/dL (ref 0–149)
VLDL Cholesterol Cal: 22 mg/dL (ref 5–40)

## 2022-07-03 LAB — HEPATIC FUNCTION PANEL
ALT: 30 IU/L (ref 0–32)
AST: 36 IU/L (ref 0–40)
Albumin: 4.4 g/dL (ref 3.9–4.9)
Alkaline Phosphatase: 82 IU/L (ref 44–121)
Bilirubin Total: 0.4 mg/dL (ref 0.0–1.2)
Bilirubin, Direct: 0.16 mg/dL (ref 0.00–0.40)
Total Protein: 7 g/dL (ref 6.0–8.5)

## 2022-07-03 NOTE — Telephone Encounter (Signed)
PA for Pantoprazole 40 mg denied due to request does not meet the definiton of medical necessity found in member booklet. Medication is covered when 2 OTC PPI are tried and do not work or when the member is unable to take all other OTC PPI. Please advise. Thank you.

## 2022-07-05 NOTE — Telephone Encounter (Signed)
Nurses please see denial form Please explained to the patient what her insurance company is saying Essentially has to try to OTC generic proton pump inhibitors.  Each 1 would have to be tried for 30 days and documented that it did not help or did not work before they will cover any other additional PPI prescription Therefore pantoprazole denied She would need to try Prevacid or omeprazole or Nexium OTC for at least 30 days to see if it will help if she does not feel it is helping she needs to send [place] a MyChart message to document this issue, if it is helping her insurance company essentially the saying that the patient has to use OTC PPI

## 2022-07-06 NOTE — Telephone Encounter (Signed)
[place] message sent to patient.

## 2022-07-08 NOTE — Addendum Note (Signed)
Addended by: Dairl Ponder on: 07/08/2022 11:26 AM   Modules accepted: Orders

## 2022-07-09 ENCOUNTER — Other Ambulatory Visit: Payer: Self-pay | Admitting: Family Medicine

## 2022-08-13 DIAGNOSIS — F332 Major depressive disorder, recurrent severe without psychotic features: Secondary | ICD-10-CM | POA: Diagnosis not present

## 2022-08-13 DIAGNOSIS — F411 Generalized anxiety disorder: Secondary | ICD-10-CM | POA: Diagnosis not present

## 2022-08-24 DIAGNOSIS — F332 Major depressive disorder, recurrent severe without psychotic features: Secondary | ICD-10-CM | POA: Diagnosis not present

## 2022-08-24 DIAGNOSIS — F411 Generalized anxiety disorder: Secondary | ICD-10-CM | POA: Diagnosis not present

## 2022-09-07 ENCOUNTER — Telehealth: Payer: BC Managed Care – PPO | Admitting: Urgent Care

## 2022-09-07 DIAGNOSIS — J0100 Acute maxillary sinusitis, unspecified: Secondary | ICD-10-CM

## 2022-09-07 MED ORDER — AMOXICILLIN-POT CLAVULANATE 875-125 MG PO TABS: 1.0000 | ORAL_TABLET | Freq: Two times a day (BID) | ORAL | 0 refills | Status: AC

## 2022-09-07 NOTE — Progress Notes (Signed)
Virtual Visit Consent   Julia [name], you are scheduled for a virtual visit with a [facility] provider today. Just as with appointments in the office, your consent must be obtained to participate. Your consent will be active for this visit and any virtual visit you may have with one of our providers in the next 365 days. If you have a MyChart account, a copy of this consent can be sent to you electronically.  As this is a virtual visit, video technology does not allow for your provider to perform a traditional examination. This may limit your provider's ability to fully assess your condition. If your provider identifies any concerns that need to be evaluated in person or the need to arrange testing (such as labs, EKG, etc.), we will make arrangements to do so. Although advances in technology are sophisticated, we cannot ensure that it will always work on either your end or our end. If the connection with a video visit is poor, the visit may have to be switched to a telephone visit. With either a video or telephone visit, we are not always able to ensure that we have a secure connection.  By engaging in this virtual visit, you consent to the provision of healthcare and authorize for your insurance to be billed (if applicable) for the services provided during this visit. Depending on your insurance coverage, you may receive a charge related to this service.  I need to obtain your verbal consent now. Are you willing to proceed with your visit today? Julia [name] has provided verbal consent on 09/07/2022 for a virtual visit (video or telephone). Julia Malling, PA  Date: 09/07/2022 4:06 PM  Virtual Visit via Video Note   I, Julia [name], connected with  Julia [name]  ([id number], 02-26-1956) on 09/07/22 at  3:00 PM EST by a video-enabled telemedicine application and verified that I am speaking with the correct person using two identifiers.  Location: Patient: Virtual Visit Location Patient:  Home Provider: Virtual Visit Location Provider: Home Office   I discussed the limitations of evaluation and management by telemedicine and the availability of in person appointments. The patient expressed understanding and agreed to proceed.    History of Present Illness: Julia [name] is a 67 y.o. who identifies as a female who was assigned female at birth, and is being seen today for sinusitis.  HPI: 67yo female presents today for concerns of sinusitis. She states she and her husband had the flu a week and a half ago. Pt states that her flu symptoms seemed to resolve, but started developing a sore throat, sinus congestion and low grade temp over the past few days. Temp was 99.7 today. Pt also has a dry cough, but denies wheezing, SOB, DOE, CP or palpitations. Pt states hx of sinus infections int he past, states that her cheeks and forehead feels "puffy and swollen" and reports pain to palpation. Feels like prior sinus infections. Pt takes zyrtec and flonase daily for routine prevention. In addition, has been taking ibuprofen, tylenol, and mucinex.    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/15/2013   Substance induced mood disorder ([place]) 09/13/2013   Severe recurrent major depression with psychotic features, 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[place]) 09/13/2013   HTN (hypertension) 03/06/2011   GERD (gastroesophageal reflux disease) 03/06/2011   Abnormal LFTs 03/06/2011    Allergies:  Allergies  Allergen Reactions   Ceftin [Cefuroxime Axetil] Shortness Of Breath, Swelling and Rash    Swelling  of the hands   Codeine Nausea And Vomiting   Crestor [Rosuvastatin Calcium]     Agitation, headaches, muscle pain, insomnia, nightmares   Vicodin [Hydrocodone-Acetaminophen]     Hyperactivity, dizziness  Pt states can take acetaminophen   Metformin And Related Rash   Medications:  Current Outpatient Medications:    amoxicillin-clavulanate (AUGMENTIN) 875-125 MG tablet, Take 1 tablet by mouth 2 (two) times daily with a meal for 10 days., Disp: 20 tablet, Rfl: 0   cetirizine (ZYRTEC) 10 MG chewable tablet, Chew 10 mg by mouth daily., Disp: , Rfl:    lisinopril-hydrochlorothiazide (ZESTORETIC) 10-12.5 MG tablet, Take 1 tablet by mouth daily., Disp: 90 tablet, Rfl: 3   pantoprazole (PROTONIX) 40 MG tablet, Take 1 tablet (40 mg total) by mouth daily., Disp: 90 tablet, Rfl: 3   potassium chloride (KLOR-CON M) 10 MEQ tablet, TAKE 1 TABLET BY MOUTH IN THE MORNING THEN TAKE 2 TABLETS BY MOUTH IN THE EVENING, Disp: 270 tablet, Rfl: 0   pravastatin (PRAVACHOL) 10 MG tablet, TAKE 1 TABLET BY MOUTH ON TWICE A WEEK ON MONDAY AND ON FRIDAY, Disp: 24 tablet, Rfl: 3  Observations/Objective: Patient is well-developed, well-nourished in no acute distress.  Resting comfortably at home.  Head is normocephalic, atraumatic.  No labored breathing. No cough Speech is clear and coherent with logical content.  Patient is alert and oriented at baseline.  TTP to bilateral frontal and maxillary sinuses  Assessment and Plan: 1. Acute non-recurrent maxillary sinusitis  Pt with sinus sx s/p influenza. Will start tx for bacterial sinusitis, pt is already taking flonase and zyrtec. Encouraged increased hydration and sinus rinses.  Follow Up Instructions: I discussed the assessment and treatment plan with the patient. The patient was provided an opportunity to ask questions and all were answered. The patient agreed with the plan and demonstrated an understanding of the instructions.  A copy of instructions were sent to  the patient via MyChart unless otherwise noted below.   The patient was advised to call back or seek an in-person evaluation if the symptoms worsen or if the condition fails to improve as anticipated.  Time:  I spent 8 minutes with the patient via telehealth technology discussing the above problems/concerns.    [place], PA

## 2022-09-07 NOTE — Patient Instructions (Signed)
Stefanie Libel, thank you for joining Chaney Malling, PA for today's virtual visit.  While this provider is not your primary care provider (PCP), if your PCP is located in our provider database this encounter information will be shared with them immediately following your visit.   [facility] account gives you access to today's visit and all your visits, tests, and labs performed at [facility] " click here if you don't have a [facility] account or go to [email or web address]  Consent: (Patient) Julia [name] provided verbal consent for this virtual visit at the beginning of the encounter.  Current Medications:  Current Outpatient Medications:    amoxicillin-clavulanate (AUGMENTIN) 875-125 MG tablet, Take 1 tablet by mouth 2 (two) times daily with a meal for 10 days., Disp: 20 tablet, Rfl: 0   cetirizine (ZYRTEC) 10 MG chewable tablet, Chew 10 mg by mouth daily., Disp: , Rfl:    lisinopril-hydrochlorothiazide (ZESTORETIC) 10-12.5 MG tablet, Take 1 tablet by mouth daily., Disp: 90 tablet, Rfl: 3   pantoprazole (PROTONIX) 40 MG tablet, Take 1 tablet (40 mg total) by mouth daily., Disp: 90 tablet, Rfl: 3   potassium chloride (KLOR-CON M) 10 MEQ tablet, TAKE 1 TABLET BY MOUTH IN THE MORNING THEN TAKE 2 TABLETS BY MOUTH IN THE EVENING, Disp: 270 tablet, Rfl: 0   pravastatin (PRAVACHOL) 10 MG tablet, TAKE 1 TABLET BY MOUTH ON TWICE A WEEK ON MONDAY AND ON FRIDAY, Disp: 24 tablet, Rfl: 3   Medications ordered in this encounter:  Meds ordered this encounter  Medications   amoxicillin-clavulanate (AUGMENTIN) 875-125 MG tablet    Sig: Take 1 tablet by mouth 2 (two) times daily with a meal for 10 days.    Dispense:  20 tablet    Refill:  0    Order Specific Question:   Supervising Provider    Answer:   Chase Picket [id number]     *If you need refills on other medications prior to your next appointment, please contact your pharmacy*  Follow-Up: Call back or  seek an in-person evaluation if the symptoms worsen or if the condition fails to improve as anticipated.  [place] [phone]  Other Instructions You have a sinus infection. Please start taking the antibiotic, Augmentin, twice daily with food. Take it for all 10 days, do not stop early just because you feel better. Take an over the counter probiotic or yogurt daily to help prevent diarrhea/ yeast infection. Continue taking zyrtec once daily and mucinex twice daily to help clear up the mucous. Use Flonase daily to help with inflammation of the nasal passage. It is also recommended that you use nasal saline/ sinus washes to cleans the sinus passages. Hot steam from a shower or vaporizer may also be beneficial to help open up the upper airway. Eucalyptus can be helpful. If any worsening symptoms such as headache, fever, or shortness of breath, please go to an in person evaluation/ urgent care.    If you have been instructed to have an in-person evaluation today at a local Urgent Care facility, please use the link below. It will take you to a list of all of our available [facility], including address, phone number and hours of operation. Please do not delay care.  [facility]  If you or a family member do not have a primary care provider, use the link below to schedule a visit and establish care. When you choose a [facility] primary care physician or advanced practice provider, you gain a long-term partner in health. Find a Primary Care Provider  Learn more about [facility] in-office and virtual care options: [facility]

## 2022-09-08 DIAGNOSIS — F411 Generalized anxiety disorder: Secondary | ICD-10-CM | POA: Diagnosis not present

## 2022-09-08 DIAGNOSIS — F332 Major depressive disorder, recurrent severe without psychotic features: Secondary | ICD-10-CM | POA: Diagnosis not present

## 2022-10-06 ENCOUNTER — Other Ambulatory Visit: Payer: Self-pay | Admitting: Family Medicine

## 2022-10-06 DIAGNOSIS — Z1231 Encounter for screening mammogram for malignant neoplasm of breast: Secondary | ICD-10-CM

## 2022-10-09 DIAGNOSIS — H40053 Ocular hypertension, bilateral: Secondary | ICD-10-CM | POA: Diagnosis not present

## 2022-10-16 ENCOUNTER — Telehealth: Payer: Self-pay | Admitting: Family Medicine

## 2022-10-16 MED ORDER — POTASSIUM CHLORIDE CRYS ER 10 MEQ PO TBCR: EXTENDED_RELEASE_TABLET | ORAL | 0 refills | Status: DC

## 2022-10-16 NOTE — Telephone Encounter (Signed)
Prescription Request  10/16/2022  LOV: 07/02/2022  What is the name of the medication or equipment? potassium chloride (KLOR-CON M) 10 MEQ tablet   Have you contacted your pharmacy to request a refill? Yes   Which pharmacy would you like this sent to?  [facility] [phone] - [place], [place] [address] [place] [id number] [place] [address] Phone: [phone] Fax: [phone]    Patient notified that their request is being sent to the clinical staff for review and that they should receive a response within 2 business days.   Please advise at [place]

## 2022-10-20 DIAGNOSIS — F332 Major depressive disorder, recurrent severe without psychotic features: Secondary | ICD-10-CM | POA: Diagnosis not present

## 2022-10-20 DIAGNOSIS — F411 Generalized anxiety disorder: Secondary | ICD-10-CM | POA: Diagnosis not present

## 2022-11-03 DIAGNOSIS — F411 Generalized anxiety disorder: Secondary | ICD-10-CM | POA: Diagnosis not present

## 2022-11-03 DIAGNOSIS — F332 Major depressive disorder, recurrent severe without psychotic features: Secondary | ICD-10-CM | POA: Diagnosis not present

## 2022-12-01 DIAGNOSIS — F411 Generalized anxiety disorder: Secondary | ICD-10-CM | POA: Diagnosis not present

## 2022-12-01 DIAGNOSIS — F332 Major depressive disorder, recurrent severe without psychotic features: Secondary | ICD-10-CM | POA: Diagnosis not present

## 2022-12-31 ENCOUNTER — Ambulatory Visit (INDEPENDENT_AMBULATORY_CARE_PROVIDER_SITE_OTHER): Payer: BC Managed Care – PPO | Admitting: Family Medicine

## 2022-12-31 VITALS — BP 116/70 | HR 74 | Ht 62.0 in | Wt 177.4 lb

## 2022-12-31 DIAGNOSIS — R7301 Impaired fasting glucose: Secondary | ICD-10-CM

## 2022-12-31 DIAGNOSIS — M2559 Pain in other specified joint: Secondary | ICD-10-CM | POA: Diagnosis not present

## 2022-12-31 DIAGNOSIS — I10 Essential (primary) hypertension: Secondary | ICD-10-CM

## 2022-12-31 DIAGNOSIS — W57XXXA Bitten or stung by nonvenomous insect and other nonvenomous arthropods, initial encounter: Secondary | ICD-10-CM

## 2022-12-31 DIAGNOSIS — S0006XA Insect bite (nonvenomous) of scalp, initial encounter: Secondary | ICD-10-CM

## 2022-12-31 DIAGNOSIS — E7849 Other hyperlipidemia: Secondary | ICD-10-CM | POA: Diagnosis not present

## 2022-12-31 DIAGNOSIS — Z79899 Other long term (current) drug therapy: Secondary | ICD-10-CM | POA: Diagnosis not present

## 2022-12-31 DIAGNOSIS — Z23 Encounter for immunization: Secondary | ICD-10-CM | POA: Diagnosis not present

## 2022-12-31 MED ORDER — PANTOPRAZOLE SODIUM 40 MG PO TBEC: 40.0000 mg | DELAYED_RELEASE_TABLET | Freq: Every day | ORAL | 3 refills | Status: DC

## 2022-12-31 MED ORDER — LISINOPRIL-HYDROCHLOROTHIAZIDE 10-12.5 MG PO TABS: 1.0000 | ORAL_TABLET | Freq: Every day | ORAL | 3 refills | Status: DC

## 2022-12-31 MED ORDER — POTASSIUM CHLORIDE CRYS ER 10 MEQ PO TBCR
EXTENDED_RELEASE_TABLET | ORAL | 0 refills | Status: AC
Start: 2022-12-31 — End: ?

## 2022-12-31 MED ORDER — PRAVASTATIN SODIUM 10 MG PO TABS
ORAL_TABLET | ORAL | 3 refills | Status: DC
Start: 2022-12-31 — End: 2023-07-14

## 2022-12-31 NOTE — Progress Notes (Signed)
Subjective:    Patient ID: Julia [name], female    DOB: [birth year], 67 y.o.   MRN: [id number]  HPI Patient arrives today for 6 month follow up for HTN. Patient states she has tick bite on head 2 weeks ago and has had joint pain and fatigue.  Tick bite on her head occurred a couple weeks ago was there for couple days had some intermittent headaches and joint pains since then but she thinks joint pains could well be age-related Outpatient Encounter Medications as of 12/31/2022  Medication Sig   cetirizine (ZYRTEC) 10 MG chewable tablet Chew 10 mg by mouth daily.   desvenlafaxine (PRISTIQ) 25 MG 24 hr tablet 10 mg. 10 mg daily   traZODone (DESYREL) 50 MG tablet Take 100 mg by mouth at bedtime as needed. 100 mg as needed   [DISCONTINUED] lisinopril-hydrochlorothiazide (ZESTORETIC) 10-12.5 MG tablet Take 1 tablet by mouth daily.   [DISCONTINUED] pantoprazole (PROTONIX) 40 MG tablet Take 1 tablet (40 mg total) by mouth daily.   [DISCONTINUED] potassium chloride (KLOR-CON M) 10 MEQ tablet TAKE 1 TABLET BY MOUTH IN THE MORNING THEN TAKE 2 TABLETS BY MOUTH IN THE EVENING   [DISCONTINUED] pravastatin (PRAVACHOL) 10 MG tablet TAKE 1 TABLET BY MOUTH ON TWICE A WEEK ON MONDAY AND ON FRIDAY   lisinopril-hydrochlorothiazide (ZESTORETIC) 10-12.5 MG tablet Take 1 tablet by mouth daily.   pantoprazole (PROTONIX) 40 MG tablet Take 1 tablet (40 mg total) by mouth daily.   potassium chloride (KLOR-CON M) 10 MEQ tablet TAKE 1 TABLET BY MOUTH IN THE MORNING THEN TAKE 2 TABLETS BY MOUTH IN THE EVENING   pravastatin (PRAVACHOL) 10 MG tablet TAKE 1 TABLET BY MOUTH ON TWICE A WEEK ON MONDAY AND ON FRIDAY   No facility-administered encounter medications on file as of 12/31/2022.    Primary hypertension - Plan: Basic Metabolic Panel  Other hyperlipidemia - Plan: Lipid panel  Pain in other joint - Plan: Sedimentation Rate, C-reactive protein  Tick bite of scalp, initial encounter  Fasting hyperglycemia - Plan:  Hemoglobin A1c  Need for vaccination - Plan: Pneumococcal conjugate vaccine 20-valent (Prevnar 20)  High risk medication use - Plan: Hepatic function panel  She relates pain and discomfort in the joints but no true stiffness no redness or swelling Patient for blood pressure check up.  The patient does have hypertension.   Patient relates dietary measures try to minimize salt The importance of healthy diet and activity were discussed Patient relates compliance  Patient here for follow-up regarding cholesterol.    Patient relates taking medication on a regular basis Denies problems with medication Importance of dietary measures discussed Regular lab work regarding lipid and liver was checked and if needing additional labs was appropriately ordered  Patient does have fasting hyperglycemia we will check A1c   Review of Systems     Objective:   Physical Exam  General-in no acute distress Eyes-no discharge Lungs-respiratory rate normal, CTA CV-no murmurs,RRR Extremities skin warm dry no edema Neuro grossly normal Behavior normal, alert       Assessment & Plan:  1. Primary hypertension HTN- patient seen for follow-up regarding HTN.   Diet, medication compliance, appropriate labs and refills were completed.   Importance of keeping blood pressure under good control to lessen the risk of complications discussed Regular follow-up visits discussed  - Basic Metabolic Panel  2. Other hyperlipidemia Hyperlipidemia-importance of diet, weight control, activity, compliance with medications discussed.   Recent labs reviewed.   Any additional labs  or refills ordered.   Importance of keeping under good control discussed. Regular follow-up visits discussed  - Lipid panel  3. Pain in other joint I doubt that this is rheumatoid arthritis we will check inflammatory markers Tylenol as needed - Sedimentation Rate - C-reactive protein  4. Tick bite of scalp, initial encounter I do  not feel this is causing any infection risk factors were discussed in detail  5. Fasting hyperglycemia Previous fasting hyperglycemia check A1c - Hemoglobin A1c  6. Need for vaccination Today - Pneumococcal conjugate vaccine 20-valent (Prevnar 20)  7. High risk medication use Due to medication check liver profile - Hepatic function panel  Follow-up 6 months  Patient is overweight but she is trying to watch her portions and trying to fit in some walking on a regular basis

## 2023-01-01 LAB — HEPATIC FUNCTION PANEL
ALT: 14 IU/L (ref 0–32)
AST: 19 IU/L (ref 0–40)
Albumin: 4.3 g/dL (ref 3.9–4.9)
Alkaline Phosphatase: 72 IU/L (ref 44–121)
Bilirubin Total: 0.2 mg/dL (ref 0.0–1.2)
Bilirubin, Direct: <0.1 mg/dL (ref 0.00–0.40)
Total Protein: 7 g/dL (ref 6.0–8.5)

## 2023-01-01 LAB — BASIC METABOLIC PANEL
BUN/Creatinine Ratio: 10 — ABNORMAL LOW (ref 12–28)
BUN: 15 mg/dL (ref 8–27)
CO2: 25 mmol/L (ref 20–29)
Calcium: 9.5 mg/dL (ref 8.7–10.3)
Chloride: 100 mmol/L (ref 96–106)
Creatinine, Ser: 1.49 mg/dL — ABNORMAL HIGH (ref 0.57–1.00)
Glucose: 98 mg/dL (ref 70–99)
Potassium: 4 mmol/L (ref 3.5–5.2)
Sodium: 139 mmol/L (ref 134–144)
eGFR: 39 mL/min/{1.73_m2} — ABNORMAL LOW (ref >59)

## 2023-01-01 LAB — SEDIMENTATION RATE: Sed Rate: 6 mm/hr (ref 0–40)

## 2023-01-01 LAB — HEMOGLOBIN A1C
Est. average glucose Bld gHb Est-mCnc: 128 mg/dL
Hgb A1c MFr Bld: 6.1 % — ABNORMAL HIGH (ref 4.8–5.6)

## 2023-01-01 LAB — LIPID PANEL
Chol/HDL Ratio: 3.3 ratio (ref 0.0–4.4)
Cholesterol, Total: 220 mg/dL — ABNORMAL HIGH (ref 100–199)
HDL: 67 mg/dL (ref >39)
LDL Chol Calc (NIH): 135 mg/dL — ABNORMAL HIGH (ref 0–99)
Triglycerides: 101 mg/dL (ref 0–149)
VLDL Cholesterol Cal: 18 mg/dL (ref 5–40)

## 2023-01-01 LAB — C-REACTIVE PROTEIN: CRP: 3 mg/L (ref 0–10)

## 2023-01-03 ENCOUNTER — Encounter: Payer: Self-pay | Admitting: Family Medicine

## 2023-01-03 DIAGNOSIS — N1832 Chronic kidney disease, stage 3b: Secondary | ICD-10-CM | POA: Insufficient documentation

## 2023-01-12 DIAGNOSIS — F332 Major depressive disorder, recurrent severe without psychotic features: Secondary | ICD-10-CM | POA: Diagnosis not present

## 2023-01-12 DIAGNOSIS — F411 Generalized anxiety disorder: Secondary | ICD-10-CM | POA: Diagnosis not present

## 2023-01-19 ENCOUNTER — Encounter: Payer: Self-pay | Admitting: *Deleted

## 2023-01-19 NOTE — Addendum Note (Signed)
Addended by: Margaretha Sheffield on: 01/19/2023 02:36 PM   Modules accepted: Orders

## 2023-02-18 DIAGNOSIS — F332 Major depressive disorder, recurrent severe without psychotic features: Secondary | ICD-10-CM | POA: Diagnosis not present

## 2023-02-18 DIAGNOSIS — F411 Generalized anxiety disorder: Secondary | ICD-10-CM | POA: Diagnosis not present

## 2023-04-01 DIAGNOSIS — F411 Generalized anxiety disorder: Secondary | ICD-10-CM | POA: Diagnosis not present

## 2023-04-01 DIAGNOSIS — F332 Major depressive disorder, recurrent severe without psychotic features: Secondary | ICD-10-CM | POA: Diagnosis not present

## 2023-04-21 DIAGNOSIS — H40013 Open angle with borderline findings, low risk, bilateral: Secondary | ICD-10-CM | POA: Diagnosis not present

## 2023-05-27 DIAGNOSIS — F411 Generalized anxiety disorder: Secondary | ICD-10-CM | POA: Diagnosis not present

## 2023-05-27 DIAGNOSIS — F332 Major depressive disorder, recurrent severe without psychotic features: Secondary | ICD-10-CM | POA: Diagnosis not present

## 2023-07-05 ENCOUNTER — Ambulatory Visit: Payer: BC Managed Care – PPO | Admitting: Family Medicine

## 2023-07-14 ENCOUNTER — Other Ambulatory Visit: Payer: Self-pay | Admitting: Family Medicine

## 2023-07-19 ENCOUNTER — Ambulatory Visit (INDEPENDENT_AMBULATORY_CARE_PROVIDER_SITE_OTHER): Payer: BC Managed Care – PPO | Admitting: Family Medicine

## 2023-07-19 VITALS — BP 128/78 | HR 70 | Temp 97.9°F | Ht 62.0 in | Wt 193.8 lb

## 2023-07-19 DIAGNOSIS — I10 Essential (primary) hypertension: Secondary | ICD-10-CM | POA: Diagnosis not present

## 2023-07-19 DIAGNOSIS — Z23 Encounter for immunization: Secondary | ICD-10-CM

## 2023-07-19 DIAGNOSIS — R7303 Prediabetes: Secondary | ICD-10-CM

## 2023-07-19 DIAGNOSIS — E7849 Other hyperlipidemia: Secondary | ICD-10-CM

## 2023-07-19 DIAGNOSIS — Z79899 Other long term (current) drug therapy: Secondary | ICD-10-CM

## 2023-07-19 NOTE — Progress Notes (Signed)
Subjective:    Patient ID: Julia [name], female    DOB: 04-05-1956, 67 y.o.   MRN: [id number]  Discussed the use of AI scribe software for clinical note transcription with the patient, who gave verbal consent to proceed.  History of Present Illness   The patient, with a history of depression, reports significant weight gain which she attributes to her antidepressant medication. She describes an increased appetite and consumption, but is attempting to make mindful dietary choices. Despite the weight gain, the patient acknowledges the antidepressant has been beneficial for her mental health.  The patient also reports difficulty with sleep, waking up three to four times a night without apparent cause. She has tried to minimize caffeine and screen time before bed, and maintain a regular sleep schedule, but the issue persists. She expresses reluctance to take Trazodone due to next-day grogginess.  The patient has been adherent to her prescribed medications for blood pressure and cholesterol. She mentions a recent high blood pressure reading, but it is unclear if this is a persistent issue.  The patient is also due for cataract surgery, which has been affecting her vision, particularly when sewing. She has been using over-the-counter drops for dry eye syndrome, a condition that needs to be managed before the surgery can proceed.  Lastly, the patient mentions a history of polyps, indicating a need for regular colonoscopies, and is due for a mammogram. She is scheduled to have labs done in mid-January.         Review of Systems     Objective:    Physical Exam   VITALS: BP- ?/80 CHEST: Lung sounds clear CARDIOVASCULAR: Heart sounds normal     General-in no acute distress Eyes-no discharge Lungs-respiratory rate normal, CTA CV-no murmurs,RRR Extremities skin warm dry no edema Neuro grossly normal Behavior normal, alert       Assessment & Plan:  Assessment and Plan     Insomnia Frequent awakenings during the night. No caffeine intake in the evening. Difficulty falling asleep and maintaining sleep. No use of sleep aids due to next-day drowsiness. -Continue good sleep hygiene practices including minimizing screen time before bed and maintaining a consistent sleep schedule. -Consider non-pharmacological methods to promote sleep such as relaxation techniques.  Depression Weight gain attributed to antidepressant medication. However, the medication has improved mental health symptoms. -Continue current antidepressant medication. -Encourage mindful eating and regular physical activity to manage weight.  Dry Eye Syndrome Currently using over-the-counter eye drops four times a day. Cataract surgery planned, but may be delayed if dry eye symptoms persist. -Continue over-the-counter eye drops. -Follow up with ophthalmologist for cataract surgery planning.  General Health Maintenance -Administer influenza vaccine today. -Schedule mammogram. -Plan to complete blood work in late January 2025. -Next colonoscopy due in September 2026.     1. Primary hypertension (Primary) Check lab work await results continue healthy diet regular physical activity blood pressure overall doing well - Basic Metabolic Panel  2. Other hyperlipidemia Continue statin check lipid LDL goal below 70 if possible - Lipid Panel  3. Prediabetes Recheck A1c due to obesity and prediabetes increased risk of diabetes - Hemoglobin A1c  4. High risk medication use Lab ordered - Hepatic Function Panel  5. Immunization due Flu shot order - Flu Vaccine Trivalent High Dose (Fluad)  Morbid obesity noted patient is trying to work hard on weight GLP-1's would be an option if they get approved for Medicare use for now she will work hard on healthy choices regular physical  activity a lot of this is related into her antidepressant medications no other intervention necessary currently Follow-up 6  months

## 2023-08-05 DIAGNOSIS — H524 Presbyopia: Secondary | ICD-10-CM | POA: Diagnosis not present

## 2023-08-05 DIAGNOSIS — H25813 Combined forms of age-related cataract, bilateral: Secondary | ICD-10-CM | POA: Diagnosis not present

## 2023-08-05 DIAGNOSIS — Q141 Congenital malformation of retina: Secondary | ICD-10-CM | POA: Diagnosis not present

## 2023-08-05 DIAGNOSIS — H04123 Dry eye syndrome of bilateral lacrimal glands: Secondary | ICD-10-CM | POA: Diagnosis not present

## 2023-08-12 DIAGNOSIS — F411 Generalized anxiety disorder: Secondary | ICD-10-CM | POA: Diagnosis not present

## 2023-08-12 DIAGNOSIS — F332 Major depressive disorder, recurrent severe without psychotic features: Secondary | ICD-10-CM | POA: Diagnosis not present

## 2023-09-03 IMAGING — US US ABDOMEN LIMITED
1 series · 14 of 25 positions shown · non-contrast
Comparison: CT abdomen pelvis 03/27/2020

CLINICAL DATA: Right lower quadrant pain for 2 days

EXAM:
ULTRASOUND ABDOMEN LIMITED RIGHT UPPER QUADRANT

[Series 1: us abdomen limited ruq (liver/gb) · 14 of 69 slices shown]
[im 1/69]
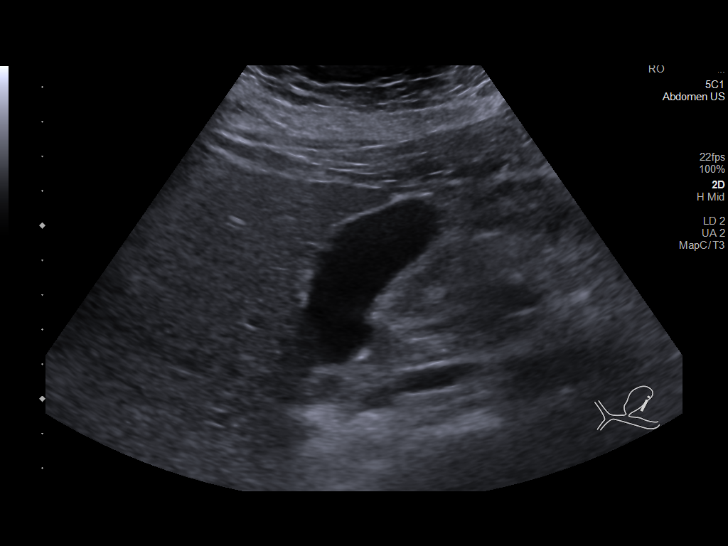
[im 6/69]
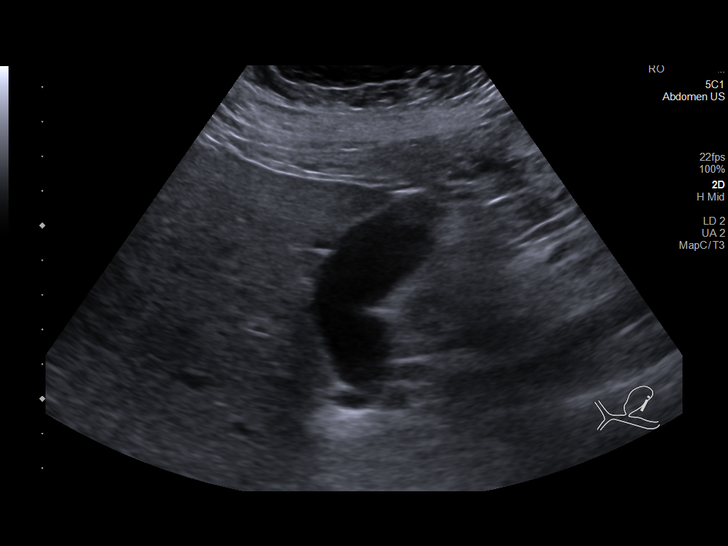
[im 12/69]
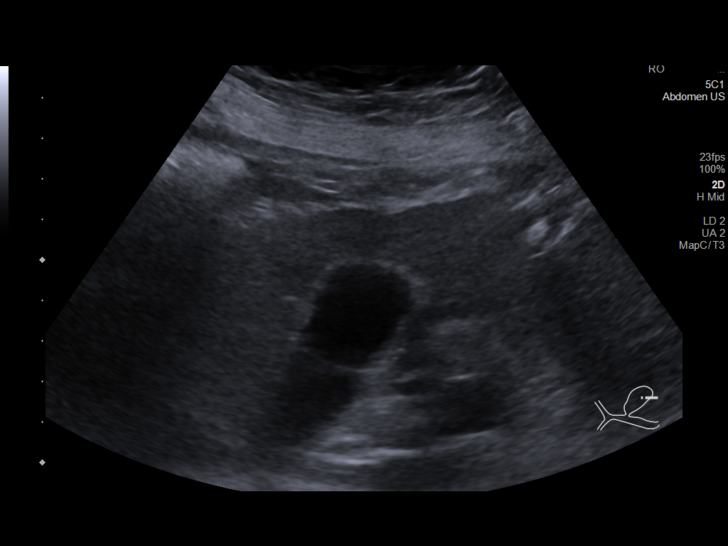
[im 18/69]
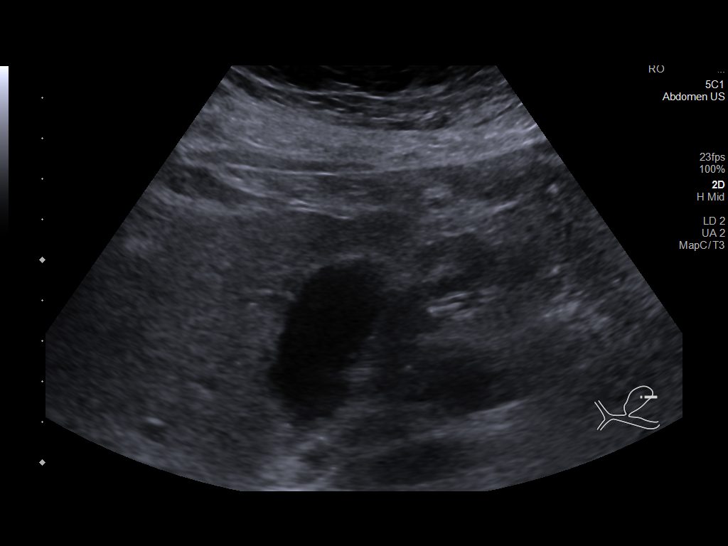
[im 23/69]
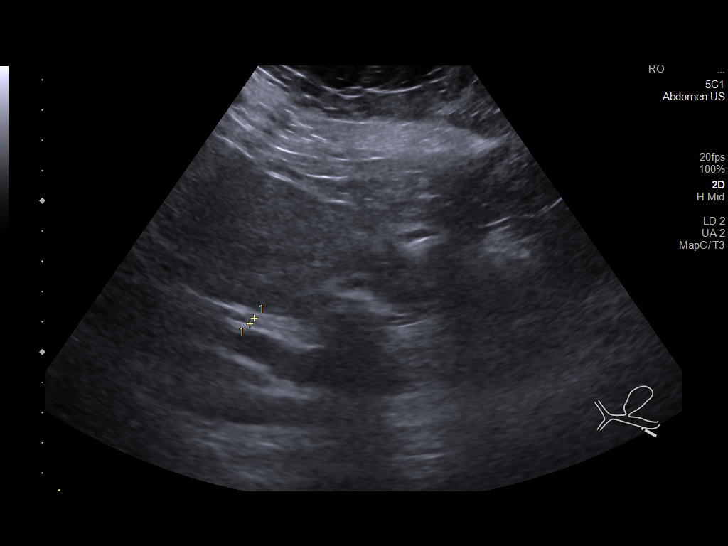
[im 26/69]
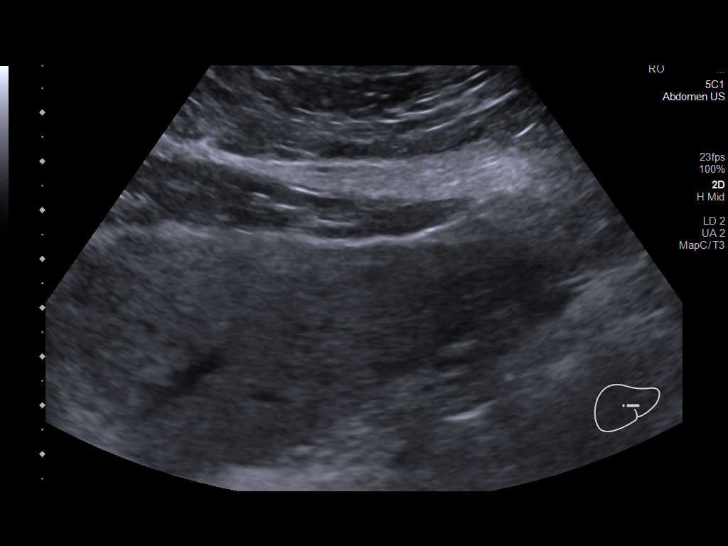
[im 32/69]
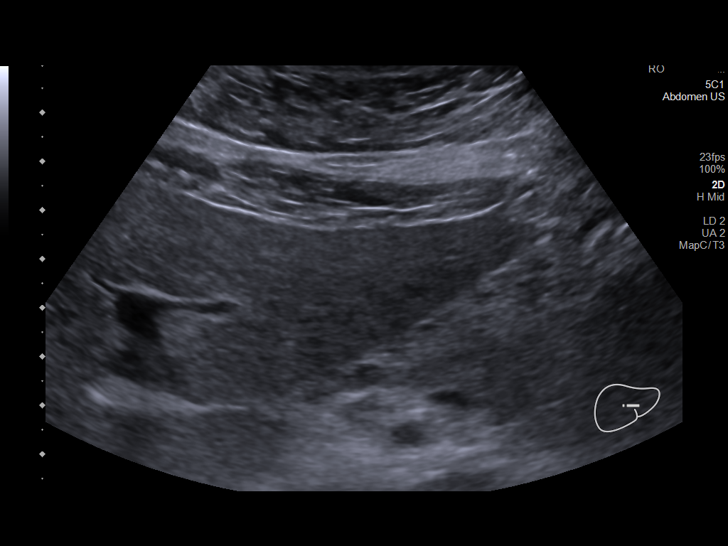
[im 37/69]
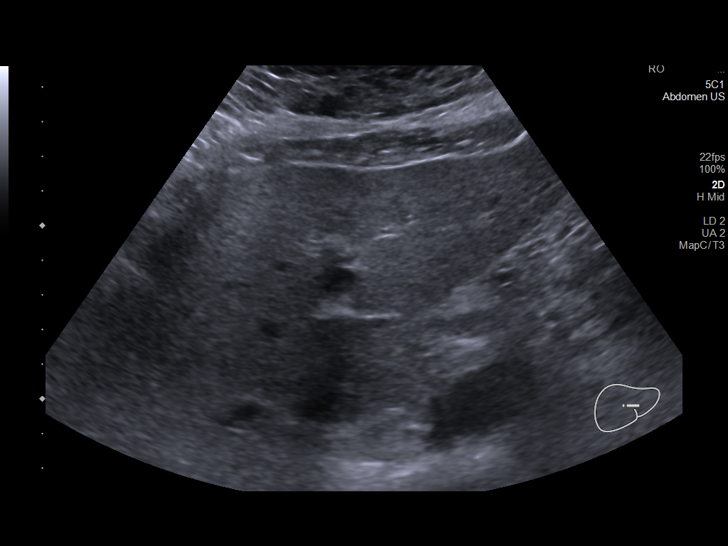
[im 43/69]
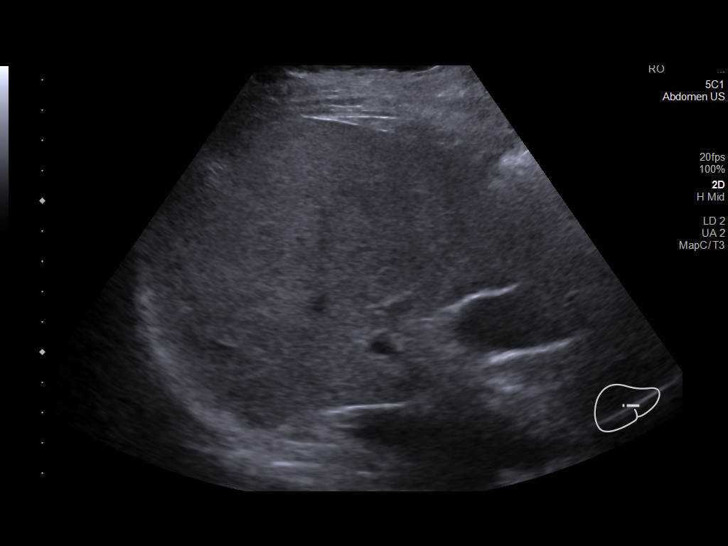
[im 46/69]
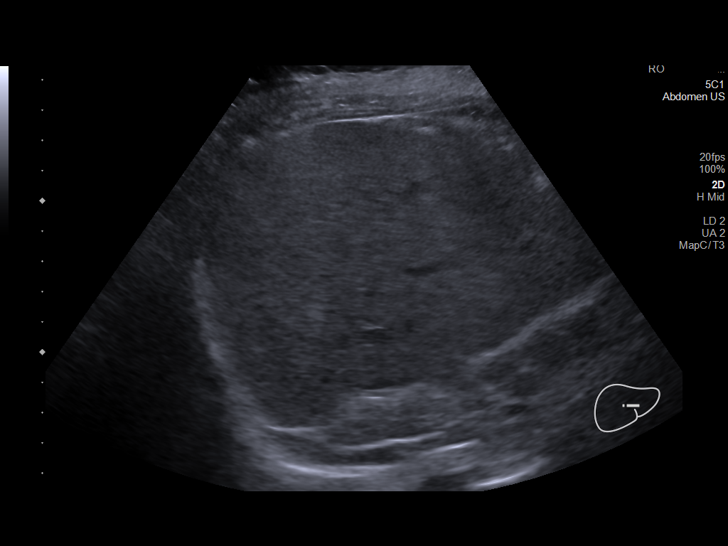
[im 52/69]
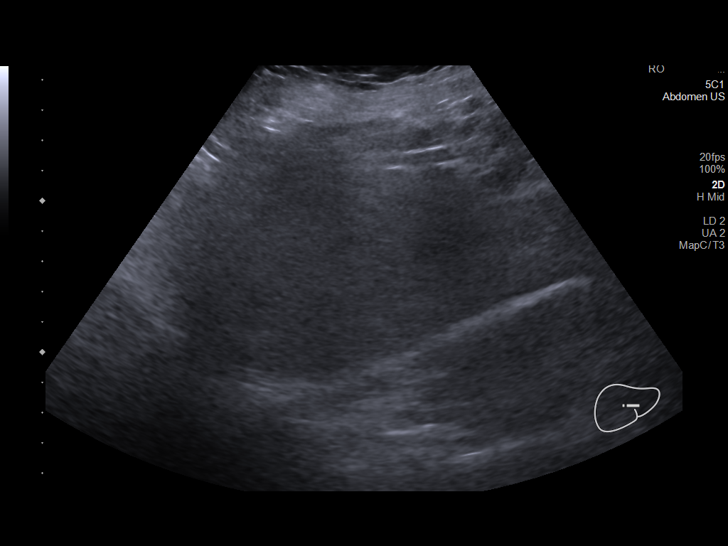
[im 57/69]
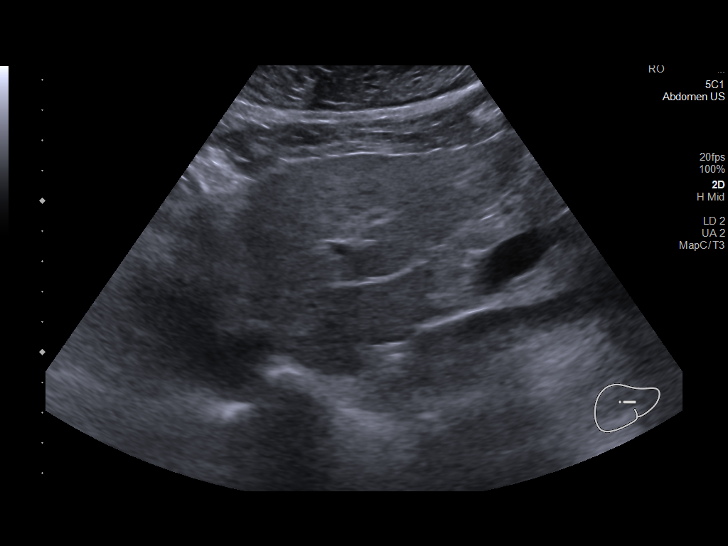
[im 63/69]
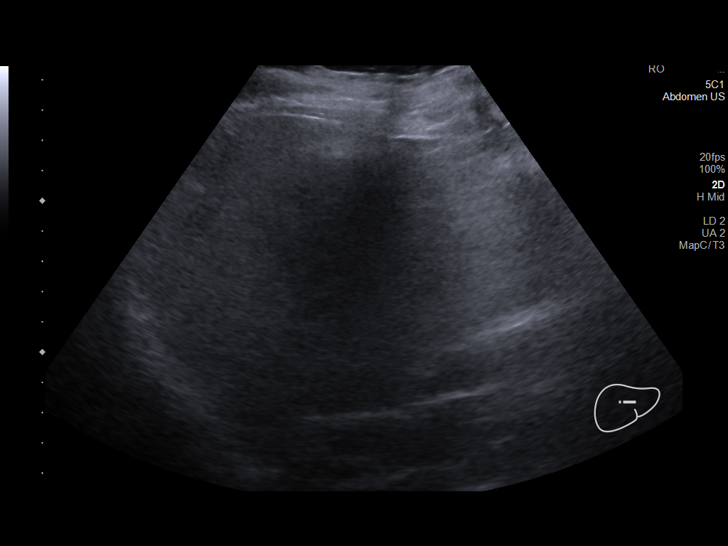
[im 69/69]
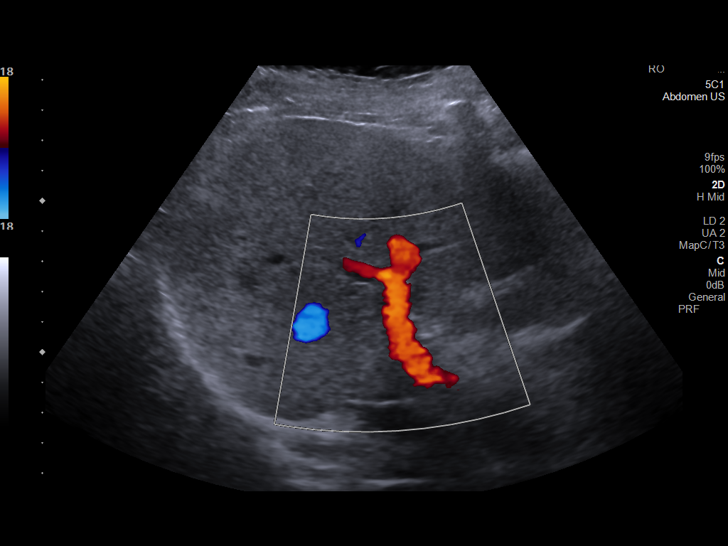

[14 of 25 positions shown; findings below may reference images not displayed]

FINDINGS: Gallbladder:

No gallstones or wall thickening visualized. No sonographic Murphy
sign noted by sonographer.

Common bile duct:

Diameter: 2 mm

Liver:

Nodularity of hepatic contours with coarsened parenchymal
echogenicity is consistent with cirrhosis. No focal hepatic lesion.
Portal vein is patent on color Doppler imaging with normal direction
of blood flow towards the liver.

Other: None.
IMPRESSION: Cirrhotic liver morphology without focal hepatic lesion.

## 2023-09-14 DIAGNOSIS — H2102 Hyphema, left eye: Secondary | ICD-10-CM | POA: Diagnosis not present

## 2023-09-14 DIAGNOSIS — H25813 Combined forms of age-related cataract, bilateral: Secondary | ICD-10-CM | POA: Diagnosis not present

## 2023-09-14 DIAGNOSIS — H268 Other specified cataract: Secondary | ICD-10-CM | POA: Diagnosis not present

## 2023-09-14 DIAGNOSIS — H18892 Other specified disorders of cornea, left eye: Secondary | ICD-10-CM | POA: Diagnosis not present

## 2023-09-14 DIAGNOSIS — H5988 Other intraoperative complications of eye and adnexa, not elsewhere classified: Secondary | ICD-10-CM | POA: Diagnosis not present

## 2023-09-28 DIAGNOSIS — Z961 Presence of intraocular lens: Secondary | ICD-10-CM | POA: Diagnosis not present

## 2023-09-28 DIAGNOSIS — T8522XA Displacement of intraocular lens, initial encounter: Secondary | ICD-10-CM | POA: Diagnosis not present

## 2023-10-07 DIAGNOSIS — F332 Major depressive disorder, recurrent severe without psychotic features: Secondary | ICD-10-CM | POA: Diagnosis not present

## 2023-10-07 DIAGNOSIS — F411 Generalized anxiety disorder: Secondary | ICD-10-CM | POA: Diagnosis not present

## 2023-10-13 ENCOUNTER — Ambulatory Visit (INDEPENDENT_AMBULATORY_CARE_PROVIDER_SITE_OTHER): Admitting: Family Medicine

## 2023-10-13 VITALS — BP 128/84 | HR 75 | Temp 99.9°F | Ht 62.0 in | Wt 201.8 lb

## 2023-10-13 DIAGNOSIS — G43E09 Chronic migraine with aura, not intractable, without status migrainosus: Secondary | ICD-10-CM | POA: Diagnosis not present

## 2023-10-13 DIAGNOSIS — R519 Headache, unspecified: Secondary | ICD-10-CM

## 2023-10-13 MED ORDER — SUMATRIPTAN SUCCINATE 25 MG PO TABS: ORAL_TABLET | ORAL | 2 refills | Status: DC

## 2023-10-13 MED ORDER — ONDANSETRON HCL 8 MG PO TABS: 8.0000 mg | ORAL_TABLET | Freq: Three times a day (TID) | ORAL | 0 refills | Status: AC | PRN

## 2023-10-13 NOTE — Progress Notes (Signed)
   Subjective:    Patient ID: Julia [name], female    DOB: May 22, 1956, 68 y.o.   MRN: [id number]  HPI Patient here today for headaches Unfortunately she is having pretty severe headaches over the past 3 months She has a history of headaches but has not had any problems with them for years and over the past 3 months she describes throbbing aching with a prodrome of pain and discomfort in her neck that radiates up to the top of her head then triggers the pain throbbing nausea and photophobia.  Patient also states she vomits at time and it helps the headache other times it does not she also states a few different times over the past month she is woke up with a headache in the middle of the night which is unusual for her patient has tried Tylenol NSAIDs without success   Review of Systems     Objective:   Physical Exam General-in no acute distress Eyes-no discharge Lungs-respiratory rate normal, CTA CV-no murmurs,RRR Extremities skin warm dry no edema Neuro grossly normal Behavior normal, alert Physical exam grossly normal       Assessment & Plan:  1. 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 Nocturnal headaches I am concerned about this Given that this is a relatively new onset headache that with severe symptoms including nocturnal headaches with vomiting it is reasonable to pursue forward with MRI do lab work first  We will do a follow-up office visit 6 to 8 weeks await labs first then pursue forward with MRI

## 2023-10-15 DIAGNOSIS — G43E09 Chronic migraine with aura, not intractable, without status migrainosus: Secondary | ICD-10-CM | POA: Diagnosis not present

## 2023-10-16 LAB — COMPREHENSIVE METABOLIC PANEL
ALT: 39 IU/L — ABNORMAL HIGH (ref 0–32)
AST: 45 IU/L — ABNORMAL HIGH (ref 0–40)
Albumin: 4.1 g/dL (ref 3.9–4.9)
Alkaline Phosphatase: 71 IU/L (ref 44–121)
BUN/Creatinine Ratio: 12 (ref 12–28)
BUN: 16 mg/dL (ref 8–27)
Bilirubin Total: 0.3 mg/dL (ref 0.0–1.2)
CO2: 28 mmol/L (ref 20–29)
Calcium: 8.9 mg/dL (ref 8.7–10.3)
Chloride: 101 mmol/L (ref 96–106)
Creatinine, Ser: 1.3 mg/dL — ABNORMAL HIGH (ref 0.57–1.00)
Globulin, Total: 2.6 g/dL (ref 1.5–4.5)
Glucose: 138 mg/dL — ABNORMAL HIGH (ref 70–99)
Potassium: 4.3 mmol/L (ref 3.5–5.2)
Sodium: 141 mmol/L (ref 134–144)
Total Protein: 6.7 g/dL (ref 6.0–8.5)
eGFR: 45 mL/min/{1.73_m2} — ABNORMAL LOW (ref >59)

## 2023-10-16 LAB — CBC WITH DIFFERENTIAL/PLATELET
Basophils Absolute: 0.2 10*3/uL (ref 0.0–0.2)
Basos: 2 %
EOS (ABSOLUTE): 0.5 10*3/uL — ABNORMAL HIGH (ref 0.0–0.4)
Eos: 8 %
Hematocrit: 40.3 % (ref 34.0–46.6)
Hemoglobin: 13.3 g/dL (ref 11.1–15.9)
Immature Grans (Abs): 0 10*3/uL (ref 0.0–0.1)
Immature Granulocytes: 0 %
Lymphocytes Absolute: 2.7 10*3/uL (ref 0.7–3.1)
Lymphs: 39 %
MCH: 28.9 pg (ref 26.6–33.0)
MCHC: 33 g/dL (ref 31.5–35.7)
MCV: 88 fL (ref 79–97)
Monocytes Absolute: 0.5 10*3/uL (ref 0.1–0.9)
Monocytes: 8 %
Neutrophils Absolute: 3 10*3/uL (ref 1.4–7.0)
Neutrophils: 43 %
Platelets: 296 10*3/uL (ref 150–450)
RBC: 4.6 x10E6/uL (ref 3.77–5.28)
RDW: 13.2 % (ref 11.7–15.4)
WBC: 6.9 10*3/uL (ref 3.4–10.8)

## 2023-10-16 LAB — C-REACTIVE PROTEIN: CRP: 4 mg/L (ref 0–10)

## 2023-10-16 LAB — SEDIMENTATION RATE: Sed Rate: 8 mm/h (ref 0–40)

## 2023-10-19 ENCOUNTER — Encounter: Payer: Self-pay | Admitting: Family Medicine

## 2023-10-20 ENCOUNTER — Other Ambulatory Visit: Payer: Self-pay

## 2023-10-20 DIAGNOSIS — R7989 Other specified abnormal findings of blood chemistry: Secondary | ICD-10-CM

## 2023-10-20 DIAGNOSIS — R748 Abnormal levels of other serum enzymes: Secondary | ICD-10-CM

## 2023-11-04 ENCOUNTER — Ambulatory Visit (HOSPITAL_COMMUNITY): Attending: Family Medicine

## 2024-01-03 DIAGNOSIS — F411 Generalized anxiety disorder: Secondary | ICD-10-CM | POA: Diagnosis not present

## 2024-01-03 DIAGNOSIS — F332 Major depressive disorder, recurrent severe without psychotic features: Secondary | ICD-10-CM | POA: Diagnosis not present

## 2024-01-07 ENCOUNTER — Other Ambulatory Visit (HOSPITAL_COMMUNITY): Payer: Self-pay

## 2024-01-07 MED ORDER — DESVENLAFAXINE SUCCINATE ER 50 MG PO TB24
50.0000 mg | ORAL_TABLET | Freq: Every day | ORAL | 0 refills | Status: DC
  Filled 2024-01-07: qty 30, 30d supply, fill #0

## 2024-01-17 ENCOUNTER — Telehealth: Payer: Self-pay | Admitting: Family Medicine

## 2024-01-17 ENCOUNTER — Telehealth: Payer: Self-pay | Admitting: Pharmacy Technician

## 2024-01-17 ENCOUNTER — Other Ambulatory Visit (HOSPITAL_COMMUNITY): Payer: Self-pay

## 2024-01-17 ENCOUNTER — Other Ambulatory Visit: Payer: Self-pay

## 2024-01-17 ENCOUNTER — Ambulatory Visit (INDEPENDENT_AMBULATORY_CARE_PROVIDER_SITE_OTHER): Payer: BC Managed Care – PPO | Admitting: Family Medicine

## 2024-01-17 VITALS — BP 112/72 | HR 51 | Temp 97.7°F | Wt 199.2 lb

## 2024-01-17 DIAGNOSIS — I10 Essential (primary) hypertension: Secondary | ICD-10-CM | POA: Diagnosis not present

## 2024-01-17 DIAGNOSIS — F32A Depression, unspecified: Secondary | ICD-10-CM | POA: Diagnosis not present

## 2024-01-17 DIAGNOSIS — G43109 Migraine with aura, not intractable, without status migrainosus: Secondary | ICD-10-CM

## 2024-01-17 MED ORDER — PRAVASTATIN SODIUM 10 MG PO TABS: ORAL_TABLET | ORAL | 3 refills | Status: AC

## 2024-01-17 MED ORDER — LISINOPRIL-HYDROCHLOROTHIAZIDE 10-12.5 MG PO TABS
1.0000 | ORAL_TABLET | Freq: Every day | ORAL | 3 refills | Status: AC
Start: 2024-01-17 — End: ?

## 2024-01-17 MED ORDER — DESVENLAFAXINE SUCCINATE ER 50 MG PO TB24: 50.0000 mg | ORAL_TABLET | Freq: Every day | ORAL | 3 refills | Status: AC

## 2024-01-17 MED ORDER — POTASSIUM CHLORIDE CRYS ER 10 MEQ PO TBCR: EXTENDED_RELEASE_TABLET | ORAL | 1 refills | Status: DC

## 2024-01-17 MED ORDER — PANTOPRAZOLE SODIUM 40 MG PO TBEC: 40.0000 mg | DELAYED_RELEASE_TABLET | Freq: Every day | ORAL | 3 refills | Status: AC

## 2024-01-17 MED ORDER — NURTEC 75 MG PO TBDP: ORAL_TABLET | ORAL | 3 refills | Status: DC

## 2024-01-17 NOTE — Telephone Encounter (Signed)
 Pharmacy Patient Advocate Encounter   Received notification from CoverMyMeds that prior authorization for Nurtec 75MG  dispersible tablets is required/requested.   Insurance verification completed.   The patient is insured through [facility] .   Per test claim: PA required; PA submitted to above mentioned insurance via CoverMyMeds Key/confirmation #/EOC [id number] Status is pending

## 2024-01-17 NOTE — Telephone Encounter (Signed)
 Pharmacy Patient Advocate Encounter  Received notification from [facility] that Prior Authorization for [facility] 0.25MG /0.5ML auto-injectors has been DENIED.  Full denial letter will be uploaded to the media tab. See denial reason below.   PA #/Case ID/Reference #: [id number]

## 2024-01-17 NOTE — Patient Instructions (Signed)
 Meds refilled.  Medication sent for migraine.  Follow up in 6 months with Dr. Geralyn Knee.

## 2024-01-17 NOTE — Telephone Encounter (Signed)
 Refill on   potassium chloride  (KLOR-CON  M) 10 MEQ tablet  Last filled 07/20/23. Patient last seen 10/13/23 [facility]

## 2024-01-18 DIAGNOSIS — G43909 Migraine, unspecified, not intractable, without status migrainosus: Secondary | ICD-10-CM | POA: Insufficient documentation

## 2024-01-18 DIAGNOSIS — F32A Depression, unspecified: Secondary | ICD-10-CM | POA: Insufficient documentation

## 2024-01-18 NOTE — Assessment & Plan Note (Signed)
 Not well-controlled.  Sending in Nurtec.

## 2024-01-18 NOTE — Assessment & Plan Note (Signed)
Stable.  Continue Pristiq.

## 2024-01-18 NOTE — Progress Notes (Signed)
 Subjective:  Patient ID: Julia [name], female    DOB: 07/16/1956  Age: 68 y.o. MRN: [id number]  CC: Follow-up   HPI:  68 year old female presents for follow-up.  Hypertension stable on lisinopril /HCTZ.  Patient reports that she needs refill on her Pristiq .  Mood is stable.  Patient reports that she continues to have intermittent migraines.  Imitrex  helps some but does not resolve them completely.  Will discuss other treatment options today.    Patient Active Problem List   Diagnosis Date Noted   Depression 01/18/2024   Migraine 01/18/2024   CKD stage 3b, GFR 30-44 ml/min (HCC) 01/03/2023   Incomplete uterovaginal prolapse 12/01/2021   Aortic atherosclerosis (HCC) 04/07/2020   History of alcohol  abuse 04/29/2016   Fatty liver 04/29/2016   Hyperlipidemia 04/29/2016   Insomnia 08/28/2014   HTN (hypertension) 03/06/2011   GERD (gastroesophageal reflux disease) 03/06/2011   Abnormal LFTs 03/06/2011    Social Hx   Social History   Socioeconomic History   Marital status: Married    Spouse name: Not on file   Number of children: Not on file   Years of education: Not on file   Highest education level: Not on file  Occupational History   Occupation: retired Char[employer] fundraiser  Tobacco Use   Smoking status: Former    Current packs/day: 0.00    Average packs/day: 1.5 packs/day for 2.0 years (3.0 ttl pk-yrs)    Types: Cigarettes    Start date: 52    Quit date: 1985    Years since quitting: 40.4   Smokeless tobacco: Never  Vaping Use   Vaping status: Never Used  Substance and Sexual Activity   Alcohol  use: Not Currently    Comment: hx of etoh abuse, pt states she quit in 2008   Drug use: Never   Sexual activity: Not on file  Other Topics Concern   Not on file  Social History Narrative   Not on file   Social Drivers of Health   Financial Resource Strain: Not on file  Food Insecurity: Not on file  Transportation Needs: Not on file  Physical Activity: Not on file  Stress:  Not on file  Social Connections: Not on file    Review of Systems Per HPI  Objective:  BP 112/72   Pulse (!) 51   Temp 97.7 F (36.5 C)   Wt 199 lb 3.2 oz (90.4 kg)   SpO2 98%   BMI 36.43 kg/m      01/17/2024   10:50 AM 10/13/2023    4:09 PM 07/19/2023    9:56 AM  BP/Weight  Systolic BP 112 128 128  Diastolic BP 72 84 78  Wt. (Lbs) 199.2 201.8   BMI 36.43 kg/m2 36.91 kg/m2     Physical Exam Vitals and nursing note reviewed.  Constitutional:      General: She is not in acute distress.    Appearance: Normal appearance. She is obese.  HENT:     Head: Normocephalic and atraumatic.   Eyes:     General:        Right eye: No discharge.        Left eye: No discharge.     Conjunctiva/sclera: Conjunctivae normal.    Cardiovascular:     Rate and Rhythm: Normal rate and regular rhythm.  Pulmonary:     Effort: Pulmonary effort is normal.     Breath sounds: Normal breath sounds. No wheezing or rales.   Neurological:     Mental Status:  She is alert.     Lab Results  Component Value Date   WBC 6.9 10/15/2023   HGB 13.3 10/15/2023   HCT 40.3 10/15/2023   PLT 296 10/15/2023   GLUCOSE 138 (H) 10/15/2023   CHOL 220 (H) 12/31/2022   TRIG 101 12/31/2022   HDL 67 12/31/2022   LDLCALC 135 (H) 12/31/2022   ALT 39 (H) 10/15/2023   AST 45 (H) 10/15/2023   NA 141 10/15/2023   K 4.3 10/15/2023   CL 101 10/15/2023   CREATININE 1.30 (H) 10/15/2023   BUN 16 10/15/2023   CO2 28 10/15/2023   TSH 1.570 09/20/2020   HGBA1C 6.1 (H) 12/31/2022     Assessment & Plan:  Depression, unspecified depression type Assessment & Plan: Stable.  Continue Pristiq .  Orders: -     Desvenlafaxine  Succinate ER; Take 1 tablet (50 mg total) by mouth at bedtime.  Dispense: 90 tablet; Refill: 3  Primary hypertension Assessment & Plan: Stable on lisinopril  HCTZ.  Continue.  Orders: -     Lisinopril -hydroCHLOROthiazide ; Take 1 tablet by mouth daily.  Dispense: 90 tablet; Refill:  3  Migraine with aura and without status migrainosus, not intractable Assessment & Plan: Not well-controlled.  Sending in Nurtec.  Orders: -     Nurtec; 75 mg at the onset of migraine. Max dose 75 mg in 24 hours.  Dispense: 8 tablet; Refill: 3  Other orders -     Pantoprazole  Sodium; Take 1 tablet (40 mg total) by mouth daily.  Dispense: 90 tablet; Refill: 3 -     Potassium Chloride  Crys ER; TAKE 1 TABLET BY MOUTH IN THE MORNING THEN TAKE 2 TABLETS BY MOUTH IN THE EVENING  Dispense: 270 tablet; Refill: 1 -     Pravastatin  Sodium; TAKE 1 TABLET BY MOUTH TWICE A WEEK ON MONDAY AND FRIDAY.  Dispense: 24 tablet; Refill: 3    Follow-up: 6 months  Adir Schicker Debrah Fan DO [facility]

## 2024-01-18 NOTE — Assessment & Plan Note (Signed)
Stable on lisinopril/HCTZ. Continue. 

## 2024-01-19 NOTE — Telephone Encounter (Signed)
 Nurses Please inform patient that Nurtec is not covered by her insurance They state that she needs to try Florette Hurry if Imitrex  is not working Florette Hurry is a newer medicine very similar to Nurtec that can be used when a severe headache occurs Please also verify with the patient is Imitrex  not working any longer? If she is interested in trying Zinc we will have to send that prescription in And when we send it in it will initially get denied and require prior approval forms This is very standard for high cost medication

## 2024-01-20 DIAGNOSIS — Z9889 Other specified postprocedural states: Secondary | ICD-10-CM | POA: Diagnosis not present

## 2024-01-20 DIAGNOSIS — Z961 Presence of intraocular lens: Secondary | ICD-10-CM | POA: Diagnosis not present

## 2024-01-20 NOTE — Telephone Encounter (Signed)
 Spoke with patient she states , [facility] and is willing to try Ubrelvy for headaches, imitrex  makes her very sleepy. Please send in to [place] .

## 2024-01-21 ENCOUNTER — Encounter: Payer: Self-pay | Admitting: Family Medicine

## 2024-01-24 ENCOUNTER — Other Ambulatory Visit (HOSPITAL_COMMUNITY): Payer: Self-pay

## 2024-01-24 ENCOUNTER — Other Ambulatory Visit: Payer: Self-pay | Admitting: Family Medicine

## 2024-01-24 ENCOUNTER — Telehealth: Payer: Self-pay | Admitting: Pharmacy Technician

## 2024-01-24 MED ORDER — UBRELVY 50 MG PO TABS: ORAL_TABLET | ORAL | 2 refills | Status: DC

## 2024-01-24 NOTE — Telephone Encounter (Signed)
 Pharmacy Patient Advocate Encounter   Received notification from CoverMyMeds that prior authorization for Ubrelvy 50MG  tablets is required/requested.   Insurance verification completed.   The patient is insured through [facility] .   Per test claim: PA required; PA submitted to above mentioned insurance via CoverMyMeds Key/confirmation #/EOC [id number] Status is pending

## 2024-01-24 NOTE — Telephone Encounter (Signed)
 Pharmacy Patient Advocate Encounter  Received notification from [facility] that Prior Authorization for Ubrelvy 50MG  tablets has been APPROVED from 01/24/2024 to 04/17/2024. Ran test claim, Copay is $0.00. This test claim was processed through [facility]- copay amounts may vary at other pharmacies due to pharmacy/plan contracts, or as the patient moves through the different stages of their insurance plan.   PA #/Case ID/Reference #: [id number]

## 2024-01-24 NOTE — Telephone Encounter (Signed)
 Per medical record any-patient cannot tolerate Imitrex  [place] was sent into [place] pharmacy This can be taken with migraine as needed 1/day If having to take frequently throughout each month then would need to consider being on medication as preventative More than likely this medication will have prior approval associated with it that may have additional stipulations we are unaware of regarding the prescription of this medicine  Please let patient know-you may send a copy of this via MyChart if desired- Thanks- Dr Glendia Fielding

## 2024-01-28 ENCOUNTER — Telehealth: Payer: Self-pay

## 2024-01-28 NOTE — Telephone Encounter (Signed)
 Patient reports multiple requests for potassium chloride 

## 2024-01-31 ENCOUNTER — Other Ambulatory Visit: Payer: Self-pay

## 2024-01-31 MED ORDER — POTASSIUM CHLORIDE CRYS ER 10 MEQ PO TBCR: EXTENDED_RELEASE_TABLET | ORAL | 1 refills | Status: AC

## 2024-02-07 DIAGNOSIS — F411 Generalized anxiety disorder: Secondary | ICD-10-CM | POA: Diagnosis not present

## 2024-02-07 DIAGNOSIS — F332 Major depressive disorder, recurrent severe without psychotic features: Secondary | ICD-10-CM | POA: Diagnosis not present

## 2024-03-16 DIAGNOSIS — H04123 Dry eye syndrome of bilateral lacrimal glands: Secondary | ICD-10-CM | POA: Diagnosis not present

## 2024-03-16 DIAGNOSIS — Z9889 Other specified postprocedural states: Secondary | ICD-10-CM | POA: Diagnosis not present

## 2024-03-31 ENCOUNTER — Other Ambulatory Visit: Payer: Self-pay

## 2024-03-31 ENCOUNTER — Ambulatory Visit
Admission: RE | Admit: 2024-03-31 | Discharge: 2024-03-31 | Disposition: A | Discharge status: Home / Self Care | Source: Ambulatory Visit | Attending: Nurse Practitioner | Admitting: Nurse Practitioner

## 2024-03-31 VITALS — BP 136/80 | HR 63 | Temp 98.2°F | Resp 20

## 2024-03-31 DIAGNOSIS — M545 Low back pain, unspecified: Secondary | ICD-10-CM

## 2024-03-31 DIAGNOSIS — G8929 Other chronic pain: Secondary | ICD-10-CM | POA: Diagnosis not present

## 2024-03-31 DIAGNOSIS — R300 Dysuria: Secondary | ICD-10-CM | POA: Diagnosis present

## 2024-03-31 LAB — POCT URINE DIPSTICK
Bilirubin, UA: NEGATIVE
Glucose, UA: NEGATIVE mg/dL
Ketones, POC UA: NEGATIVE mg/dL
Leukocytes, UA: NEGATIVE
Nitrite, UA: NEGATIVE
POC PROTEIN,UA: NEGATIVE
Spec Grav, UA: 1.015 (ref 1.010–1.025)
Urobilinogen, UA: 1 U/dL
pH, UA: 6 (ref 5.0–8.0)

## 2024-03-31 MED ORDER — DEXAMETHASONE SODIUM PHOSPHATE 10 MG/ML IJ SOLN
10.0000 mg | INTRAMUSCULAR | Status: AC
  Administered 2024-03-31: 10 mg via INTRAMUSCULAR

## 2024-03-31 MED ORDER — TIZANIDINE HCL 4 MG PO TABS: 4.0000 mg | ORAL_TABLET | Freq: Three times a day (TID) | ORAL | 0 refills | Status: AC | PRN

## 2024-03-31 MED ORDER — PREDNISONE 20 MG PO TABS: 40.0000 mg | ORAL_TABLET | Freq: Every day | ORAL | 0 refills | Status: AC

## 2024-03-31 NOTE — ED Triage Notes (Addendum)
 Pt reports lower back pain x2 weeks. Pt reports pain has increased recently and has had urinary incontinence with standing, chills,and dysuria x1 week. denies known injury.

## 2024-03-31 NOTE — ED Provider Notes (Signed)
 [facility]    CSN: [id number] Arrival date & time: 03/31/24  1835      History   Chief Complaint Chief Complaint  Patient presents with   Back Pain    HPI ERSILIA BRAWLEY is a 68 y.o. female.   The history is provided by the patient.   Patient presents for complaints of bilateral low back pain and worsening urinary incontinence.  Patient states that her pain has been persistent for the past several years.  She also complains of worsening urinary incontinence with standing and pain with urination.  Patient states that she had a hysterectomy approximately 2 years ago, and also had her bladder tacked at that time.  She states that the gynecologist told her that she may experience the symptoms.  Per review of the chart, patient with underlying history of chronic low back pain.  So far, states she has been performing stretches for her pain.  Patient reports that she has seen orthopedics in the past.   Past Medical History:  Diagnosis Date   Anxiety disorder    Aortic atherosclerosis (HCC) 04/07/2020   Patient on statin we will follow closely   Chronic low back pain 11/2018   chronic bilateral low back pain   Complication of anesthesia    Pt states that she is difficult to wake up from surgery. - pt denies this 12/01/21   Depression, major    Suicide attempt w/ injection of KCL, around 20 years ago per pt on 11/17/2021   ETOH abuse    remission since 2008   Family history of adverse reaction to anesthesia    Pt states that her mother and daughter are difficult to wake up from surgery.   GERD (gastroesophageal reflux disease)    HLD (hyperlipidemia)    Hypertension    Meningitis    Obesity    Personal history of suicidal behavior 03/07/2011   Better since stopped drinking   Sciatica of right side 2022    Patient Active Problem List   Diagnosis Date Noted   Depression 01/18/2024   Migraine 01/18/2024   CKD stage 3b, GFR 30-44 ml/min (HCC) 01/03/2023   Incomplete  uterovaginal prolapse 12/01/2021   Aortic atherosclerosis (HCC) 04/07/2020   History of alcohol  abuse 04/29/2016   Fatty liver 04/29/2016   Hyperlipidemia 04/29/2016   Insomnia 08/28/2014   HTN (hypertension) 03/06/2011   GERD (gastroesophageal reflux disease) 03/06/2011   Abnormal LFTs 03/06/2011    Past Surgical History:  Procedure Laterality Date   ANTERIOR (CYSTOCELE) AND POSTERIOR REPAIR (RECTOCELE) WITH XENFORM GRAFT AND SACROSPINOUS FIXATION N/A 12/01/2021   Procedure: ANTERIOR (CYSTOCELE) UTERSACRAL LIGAMENT SUSPENSION;  Surgeon: Pinn, Walda, MD;  Location: [facility];  Service: Gynecology;  Laterality: N/A;   APPENDECTOMY  1978   BLADDER SUSPENSION N/A 12/01/2021   Procedure: TRANSVAGINAL TAPE (TVT) PROCEDURE;  Surgeon: Bettina Muskrat, MD;  Location: [facility];  Service: Gynecology;  Laterality: N/A;   BREAST SURGERY  1996   biopsy   CATARACT EXTRACTION     COLONOSCOPY WITH PROPOFOL  N/A 04/22/2020   Procedure: COLONOSCOPY WITH PROPOFOL ;  Surgeon: Cindie Carlin POUR, DO;  Location: [facility];  Service: Endoscopy;  Laterality: N/A;  12:45pm   LAPAROSCOPIC VAGINAL HYSTERECTOMY WITH SALPINGO OOPHORECTOMY Bilateral 12/01/2021   Procedure: LAPAROSCOPIC ASSISTED VAGINAL HYSTERECTOMY WITH SALPINGO OOPHORECTOMY;  Surgeon: Bettina Muskrat, MD;  Location: [facility];  Service: Gynecology;  Laterality: Bilateral;   LAPAROSCOPIC VAGINAL HYSTERECTOMY WITH SALPINGO OOPHORECTOMY Bilateral 12/02/2021  Benign pathology   NASAL POLYP SURGERY Left 1994   POLYPECTOMY  04/22/2020   Procedure: POLYPECTOMY;  Surgeon: Cindie Carlin POUR, DO;  Location: [facility];  Service: Endoscopy;;    OB History   No obstetric history on file.      Home Medications    Prior to Admission medications   Medication Sig Start Date End Date Taking? Authorizing Provider  cetirizine (ZYRTEC) 10 MG chewable tablet Chew 10 mg by mouth daily.    [provider]  desvenlafaxine  (PRISTIQ ) 50 MG 24 hr tablet Take 1 tablet (50 mg total) by mouth at bedtime. 01/17/24   Cook, Jayce G, DO  lisinopril -hydrochlorothiazide  (ZESTORETIC ) 10-12.5 MG tablet Take 1 tablet by mouth daily. 01/17/24   Cook, Jayce G, DO  ondansetron  (ZOFRAN ) 8 MG tablet Take 1 tablet (8 mg total) by mouth every 8 (eight) hours as needed for nausea or vomiting. 10/13/23   Alphonsa Glendia LABOR, MD  pantoprazole  (PROTONIX ) 40 MG tablet Take 1 tablet (40 mg total) by mouth daily. 01/17/24   Cook, Jayce G, DO  potassium chloride  (KLOR-CON  M) 10 MEQ tablet TAKE 1 TABLET BY MOUTH IN THE MORNING THEN TAKE 2 TABLETS BY MOUTH IN THE EVENING 01/31/24   Luking, Glendia LABOR, MD  pravastatin  (PRAVACHOL ) 10 MG tablet TAKE 1 TABLET BY MOUTH TWICE A WEEK ON MONDAY AND FRIDAY. 01/17/24   Cook, Jayce G, DO  Ubrogepant  (UBRELVY ) 50 MG TABS 1 taken with severe migraine prn 01/24/24   Alphonsa Glendia LABOR, MD    Family History Family History  Problem Relation Age of Onset   Cancer Mother        Breast   Hypertension Father    Colon cancer Maternal Aunt        11s    Social History Social History   Tobacco Use   Smoking status: Former    Current packs/day: 0.00    Average packs/day: 1.5 packs/day for 2.0 years (3.0 ttl pk-yrs)    Types: Cigarettes    Start date: 47    Quit date: 56    Years since quitting: 40.6   Smokeless tobacco: Never  Vaping Use   Vaping status: Never Used  Substance Use Topics   Alcohol  use: Not Currently    Comment: hx of etoh abuse, pt states she quit in 2008   Drug use: Never     Allergies   Ceftin [cefuroxime axetil], Codeine, Crestor  [rosuvastatin  calcium ], Vicodin [hydrocodone-acetaminophen ], Imitrex  [sumatriptan ], and Metformin and related   Review of Systems Review of Systems Per HPI  Physical Exam Triage Vital Signs ED Triage Vitals  Encounter Vitals Group     BP 03/31/24 1915 136/80     Girls Systolic BP Percentile --      Girls Diastolic BP Percentile --       Boys Systolic BP Percentile --      Boys Diastolic BP Percentile --      Pulse Rate 03/31/24 1915 63     Resp 03/31/24 1915 20     Temp 03/31/24 1915 98.2 F (36.8 C)     Temp Source 03/31/24 1915 Oral     SpO2 03/31/24 1915 95 %     Weight --      Height --      Head Circumference --      Peak Flow --      Pain Score 03/31/24 1913 8     Pain Loc --      Pain  Education --      Exclude from [employer] Chart --    No data found.  Updated Vital Signs BP 136/80 (BP Location: Right Arm)   Pulse 63   Temp 98.2 F (36.8 C) (Oral)   Resp 20   SpO2 95%   Visual Acuity Right Eye Distance:   Left Eye Distance:   Bilateral Distance:    Right Eye Near:   Left Eye Near:    Bilateral Near:     Physical Exam Vitals and nursing note reviewed.  Constitutional:      General: She is not in acute distress.    Appearance: Normal appearance.  HENT:     Head: Normocephalic.  Neurological:     Mental Status: She is alert.      UC Treatments / Results  Labs (all labs ordered are listed, but only abnormal results are displayed) Labs Reviewed  POCT URINE DIPSTICK - Abnormal; Notable for the following components:      Result Value   Blood, UA trace-intact (*)    All other components within normal limits    EKG   Radiology No results found.  Procedures Procedures (including critical care time)  Medications Ordered in UC Medications - No data to display  Initial Impression / Assessment and Plan / UC Course  I have reviewed the triage vital signs and the nursing notes.  Pertinent labs & imaging results that were available during my care of the patient were reviewed by me and considered in my medical decision making (see chart for details).  Urinalysis was negative for signs of infection, urine culture is pending..  Patient with history of chronic low back pain and urinary incontinence.  Decadron  10 mg IM administered for back pain.  Will start patient on prednisone  40 mg for  the next 5 days and tizanidine  4 mg.  Discussion with patient that it is recommended that she follow-up with gynecology to discuss the urinary incontinence she is continuing to experience after her hysterectomy.  Also recommend reevaluation by orthopedics for continued low back pain.  Supportive care recommendations were provided and discussed with the patient to include the use of ice or heat, stretching exercises, and staying active.  Patient was given strict ER follow-up precautions.  Patient was in agreement with this plan of care and verbalized understanding.  All questions were answered.  Patient stable for discharge.  Final Clinical Impressions(s) / UC Diagnoses   Final diagnoses:  None   Discharge Instructions   None    ED Prescriptions   None    PDMP not reviewed this encounter.   Gilmer Etta PARAS, NP 03/31/24 2007

## 2024-03-31 NOTE — Discharge Instructions (Addendum)
 Urine culture has been ordered.  You will be contacted if the pending test results are abnormal.  You will also have access to your results via MyChart. You were given an injection of Decadron  10 mg.  Start the prednisone  on tomorrow. Take medication as prescribed.  Do not take any additional NSAIDs while you are taking the prednisone , also recommend that you do not take any NSAIDs due to your decreased kidney function. Try to remain as active as possible. Gentle range of motion and stretching exercises to help with back spasm and pain. May apply ice or heat as needed.  Ice is recommended for pain or swelling, heat for spasm or stiffness.  Apply for 20 minutes, remove for 1 hour, then repeat. May take over-the-counter Tylenol  for pain. Go to the emergency department immediately if you develop weakness in your legs or feet, inability to walk, loss of bowel or bladder function, difficulty urinating or passing a bowel movement, or other concerns. As discussed, please call your gynecologist to schedule an appointment regarding your worsening urinary incontinence. Also for your back pain, if symptoms fail to improve, recommend follow-up with orthopedics for reevaluation. Follow-up as needed.

## 2024-04-01 LAB — URINE CULTURE: Culture: <10000 — AB

## 2024-04-04 ENCOUNTER — Ambulatory Visit (HOSPITAL_COMMUNITY): Payer: Self-pay

## 2024-04-10 DIAGNOSIS — F411 Generalized anxiety disorder: Secondary | ICD-10-CM | POA: Diagnosis not present

## 2024-04-19 ENCOUNTER — Other Ambulatory Visit (HOSPITAL_COMMUNITY): Payer: Self-pay

## 2024-04-28 ENCOUNTER — Ambulatory Visit

## 2024-05-18 ENCOUNTER — Ambulatory Visit: Admitting: Orthopedic Surgery

## 2024-06-21 DIAGNOSIS — H04123 Dry eye syndrome of bilateral lacrimal glands: Secondary | ICD-10-CM | POA: Diagnosis not present

## 2024-06-21 DIAGNOSIS — Z9889 Other specified postprocedural states: Secondary | ICD-10-CM | POA: Diagnosis not present

## 2024-07-18 ENCOUNTER — Ambulatory Visit (INDEPENDENT_AMBULATORY_CARE_PROVIDER_SITE_OTHER): Admitting: Family Medicine

## 2024-07-18 VITALS — BP 136/78 | Ht 62.0 in | Wt 202.4 lb

## 2024-07-18 DIAGNOSIS — E7849 Other hyperlipidemia: Secondary | ICD-10-CM

## 2024-07-18 DIAGNOSIS — I10 Essential (primary) hypertension: Secondary | ICD-10-CM | POA: Diagnosis not present

## 2024-07-18 DIAGNOSIS — Z79899 Other long term (current) drug therapy: Secondary | ICD-10-CM

## 2024-07-18 DIAGNOSIS — N1831 Chronic kidney disease, stage 3a: Secondary | ICD-10-CM | POA: Diagnosis not present

## 2024-07-18 DIAGNOSIS — R7303 Prediabetes: Secondary | ICD-10-CM

## 2024-07-18 MED ORDER — UBRELVY 50 MG PO TABS: ORAL_TABLET | ORAL | 2 refills | Status: AC

## 2024-07-18 NOTE — Progress Notes (Addendum)
" ° °  Subjective:    Patient ID: Julia [name], female    DOB: 1955/10/21, 68 y.o.   MRN: [id number]  HPI Patient with CKD Tries to eat healthy Also has hypertension and diabetes as well as hyperlipidemia Takes her medicine as directed Tries to stay active Overall health has been doing fairly well Moods are doing well She is due for lab work  We reviewed over her previous labs and reviewed over previous med list.  Patient states her moods overall are doing well   Review of Systems     Objective:   Physical Exam General-in no acute distress Eyes-no discharge Lungs-respiratory rate normal, CTA CV-no murmurs,RRR Extremities skin warm dry no edema Neuro grossly normal Behavior normal, alert        Assessment & Plan:  1. CKD stage 3a, GFR 45-59 ml/min (HCC) (Primary) Check lab work Stay well-hydrated Await the results May may not need nephrology consult - Basic metabolic panel with GFR - Microalbumin/Creatinine Ratio, Urine  2. Primary hypertension Blood pressure on recheck good continue current medications check labs - Basic metabolic panel with GFR - Microalbumin/Creatinine Ratio, Urine  3. High risk medication use Check labs - Hepatic function panel  4. Prediabetes Healthy diet minimize starches check A1c - Hemoglobin A1c  5. Other hyperlipidemia Check lipid profile healthy diet - Lipid Panel  It should be noted that this patient does have migraines She uses Ubrelvy  it does very well for her In the past she has tried sumatriptan  hand and other triptans and it caused significant sleepiness/fatigue along with nausea.  That is why we have been using Ubrelvy   Morbid obesity-portion control regular physical activity "

## 2024-08-28 ENCOUNTER — Telehealth: Payer: Self-pay | Admitting: Pharmacy Technician

## 2024-08-28 ENCOUNTER — Other Ambulatory Visit (HOSPITAL_COMMUNITY): Payer: Self-pay

## 2024-08-28 NOTE — Telephone Encounter (Signed)
 Pharmacy Patient Advocate Encounter   Received notification from [facility] that prior authorization for Ubrelvy  50mg  is required/requested.   Insurance verification completed.   The patient is insured through [facility] [employer].   Per test claim: PA required; PA submitted to above mentioned insurance via Latent Key/confirmation #/EOC [facility] Status is pending

## 2024-08-29 NOTE — Telephone Encounter (Signed)
 Thank you for your help I can already anticipate the patient's concern-in the past she tried Imitrex  and stated that it caused fatigue drowsiness and therefore did not want to take sumatriptan  for her headache.  So when this type of situation do we have to try another type of triptan?  Or will a previous trial in the past suffice to allow her to get the Ubrelvy   Just trying to figure out how to navigate this particular issue with this patient as well as the insurance company thank you for any  tips regarding this-Georgette Helmer

## 2024-08-29 NOTE — Telephone Encounter (Signed)
 Pharmacy Patient Advocate Encounter  Received notification from [employer] that Prior Authorization for Ubrelvy  50mg  has been DENIED.  Full denial letter will be uploaded to the media tab. See denial reason below.  I see that she had sumatriptan  last year, but there wasn't mention of it in the notes.   PA #/Case ID/Reference #: [id number]

## 2024-08-31 NOTE — Telephone Encounter (Signed)
 Pharmacy Patient Advocate Encounter  Thanks for that information. Would it be possible for this to be documented in her chart notes; we could likely appeal it, if that's what you'd prefer.   Our chance of getting the appeal approved is greater if it's in the office note. I see there's a conversation in her chart where it says sumatriptan  makes her very sleepy and she can not tolerate it. If you could put that in the office visit notes, in the migraine section and review that each time so that when we have renewals, we can send the most recent notes and it shows she failed sumatriptan , that would be helpful.  Thanks for being open to suggestions!

## 2024-09-06 NOTE — Telephone Encounter (Signed)
 I did go back and make an addendum in her December note to reflect this thank you

## 2024-09-07 ENCOUNTER — Telehealth: Payer: Self-pay | Admitting: Pharmacist

## 2024-09-07 NOTE — Telephone Encounter (Signed)
 Appeal has been submitted. Will advise when response is received, please be advised that most companies may take 30 days to make a decision. Appeal letter and supporting documentation have been uploaded and submitted via [employer] website on 09/07/2024.  Thank you, Devere Pandy, PharmD Clinical Pharmacist  [place]  Direct Dial: [phone]

## 2025-01-16 ENCOUNTER — Ambulatory Visit: Admitting: Family Medicine
